# Patient Record
Sex: Female | Born: 1935 | Race: White | Hispanic: No | Marital: Single | Smoking: Never smoker
Health system: Southern US, Community
[De-identification: ages and names within clinical notes are randomized; demographics above are authoritative.]

## PROBLEM LIST (undated history)

## (undated) DIAGNOSIS — K635 Polyp of colon: Secondary | ICD-10-CM

## (undated) DIAGNOSIS — F329 Major depressive disorder, single episode, unspecified: Secondary | ICD-10-CM

## (undated) DIAGNOSIS — F419 Anxiety disorder, unspecified: Secondary | ICD-10-CM

## (undated) DIAGNOSIS — A809 Acute poliomyelitis, unspecified: Secondary | ICD-10-CM

## (undated) DIAGNOSIS — D649 Anemia, unspecified: Secondary | ICD-10-CM

## (undated) DIAGNOSIS — F32A Depression, unspecified: Secondary | ICD-10-CM

## (undated) DIAGNOSIS — I10 Essential (primary) hypertension: Secondary | ICD-10-CM

## (undated) DIAGNOSIS — R569 Unspecified convulsions: Secondary | ICD-10-CM

## (undated) HISTORY — DX: Anxiety disorder, unspecified: F41.9

## (undated) HISTORY — DX: Major depressive disorder, single episode, unspecified: F32.9

## (undated) HISTORY — DX: Depression, unspecified: F32.A

## (undated) HISTORY — DX: Acute poliomyelitis, unspecified: A80.9

## (undated) HISTORY — DX: Unspecified convulsions: R56.9

## (undated) HISTORY — DX: Essential (primary) hypertension: I10

## (undated) HISTORY — DX: Polyp of colon: K63.5

---

## 2005-11-07 ENCOUNTER — Emergency Department (HOSPITAL_COMMUNITY): Admission: EM | Admit: 2005-11-07 | Discharge: 2005-11-07 | Payer: Self-pay | Admitting: Emergency Medicine

## 2006-07-11 ENCOUNTER — Ambulatory Visit (HOSPITAL_COMMUNITY): Admission: RE | Admit: 2006-07-11 | Discharge: 2006-07-11 | Payer: Self-pay | Admitting: Ophthalmology

## 2006-08-01 ENCOUNTER — Ambulatory Visit (HOSPITAL_COMMUNITY): Admission: RE | Admit: 2006-08-01 | Discharge: 2006-08-01 | Payer: Self-pay | Admitting: Ophthalmology

## 2007-01-18 HISTORY — PX: OTHER SURGICAL HISTORY: SHX169

## 2007-07-08 ENCOUNTER — Encounter: Payer: Self-pay | Admitting: Orthopedic Surgery

## 2007-07-08 ENCOUNTER — Emergency Department (HOSPITAL_COMMUNITY): Admission: EM | Admit: 2007-07-08 | Discharge: 2007-07-08 | Payer: Self-pay | Admitting: Emergency Medicine

## 2007-07-09 ENCOUNTER — Ambulatory Visit: Payer: Self-pay | Admitting: Orthopedic Surgery

## 2007-07-09 DIAGNOSIS — S82843A Displaced bimalleolar fracture of unspecified lower leg, initial encounter for closed fracture: Secondary | ICD-10-CM

## 2007-07-16 ENCOUNTER — Ambulatory Visit: Payer: Self-pay | Admitting: Orthopedic Surgery

## 2007-07-18 ENCOUNTER — Telehealth: Payer: Self-pay | Admitting: Orthopedic Surgery

## 2007-07-19 ENCOUNTER — Ambulatory Visit: Payer: Self-pay | Admitting: Orthopedic Surgery

## 2007-07-19 ENCOUNTER — Inpatient Hospital Stay (HOSPITAL_COMMUNITY): Admission: AD | Admit: 2007-07-19 | Discharge: 2007-07-26 | Payer: Self-pay | Admitting: Orthopedic Surgery

## 2007-07-19 ENCOUNTER — Telehealth: Payer: Self-pay | Admitting: Orthopedic Surgery

## 2007-07-20 ENCOUNTER — Encounter: Payer: Self-pay | Admitting: Orthopedic Surgery

## 2007-07-24 ENCOUNTER — Telehealth: Payer: Self-pay | Admitting: Orthopedic Surgery

## 2007-07-26 ENCOUNTER — Encounter: Payer: Self-pay | Admitting: Orthopedic Surgery

## 2007-07-30 ENCOUNTER — Encounter: Payer: Self-pay | Admitting: Orthopedic Surgery

## 2007-08-02 ENCOUNTER — Ambulatory Visit: Payer: Self-pay | Admitting: Orthopedic Surgery

## 2007-08-29 ENCOUNTER — Telehealth: Payer: Self-pay | Admitting: Orthopedic Surgery

## 2007-08-30 ENCOUNTER — Ambulatory Visit: Payer: Self-pay | Admitting: Orthopedic Surgery

## 2007-10-11 ENCOUNTER — Ambulatory Visit: Payer: Self-pay | Admitting: Orthopedic Surgery

## 2008-04-21 ENCOUNTER — Emergency Department (HOSPITAL_COMMUNITY): Admission: EM | Admit: 2008-04-21 | Discharge: 2008-04-21 | Payer: Self-pay | Admitting: Emergency Medicine

## 2008-05-01 ENCOUNTER — Ambulatory Visit (HOSPITAL_COMMUNITY): Admission: RE | Admit: 2008-05-01 | Discharge: 2008-05-01 | Payer: Self-pay | Admitting: Pulmonary Disease

## 2008-08-04 ENCOUNTER — Emergency Department (HOSPITAL_COMMUNITY): Admission: EM | Admit: 2008-08-04 | Discharge: 2008-08-04 | Payer: Self-pay | Admitting: Emergency Medicine

## 2009-01-14 ENCOUNTER — Emergency Department (HOSPITAL_COMMUNITY): Admission: EM | Admit: 2009-01-14 | Discharge: 2009-01-14 | Payer: Self-pay | Admitting: Emergency Medicine

## 2009-04-18 ENCOUNTER — Ambulatory Visit (HOSPITAL_COMMUNITY): Admission: EM | Admit: 2009-04-18 | Discharge: 2009-04-18 | Payer: Self-pay | Admitting: Emergency Medicine

## 2009-04-18 ENCOUNTER — Ambulatory Visit: Payer: Self-pay | Admitting: Internal Medicine

## 2009-04-20 ENCOUNTER — Encounter: Payer: Self-pay | Admitting: Internal Medicine

## 2009-05-12 ENCOUNTER — Ambulatory Visit (HOSPITAL_COMMUNITY): Admission: RE | Admit: 2009-05-12 | Discharge: 2009-05-12 | Payer: Self-pay | Admitting: Internal Medicine

## 2009-05-12 ENCOUNTER — Ambulatory Visit: Payer: Self-pay | Admitting: Internal Medicine

## 2009-11-17 HISTORY — PX: FOOT SURGERY: SHX648

## 2009-12-16 ENCOUNTER — Inpatient Hospital Stay (HOSPITAL_COMMUNITY): Admission: EM | Admit: 2009-12-16 | Discharge: 2009-12-18 | Payer: Self-pay | Admitting: Emergency Medicine

## 2010-02-16 NOTE — Letter (Signed)
Summary: triage order  triage order   Imported By: Sofie Rower 04/20/2009 10:50:53  _____________________________________________________________________  External Attachment:    Type:   Image     Comment:   External Document

## 2010-03-30 LAB — COMPREHENSIVE METABOLIC PANEL
ALT: 14 U/L (ref 0–35)
AST: 33 U/L (ref 0–37)
Albumin: 3.6 g/dL (ref 3.5–5.2)
Alkaline Phosphatase: 99 U/L (ref 39–117)
CO2: 28 mEq/L (ref 19–32)
Chloride: 102 mEq/L (ref 96–112)
Creatinine, Ser: 0.91 mg/dL (ref 0.4–1.2)
GFR calc Af Amer: >60 mL/min (ref >60)
GFR calc non Af Amer: >60 mL/min (ref >60)
Potassium: 4.4 mEq/L (ref 3.5–5.1)
Total Bilirubin: 0.5 mg/dL (ref 0.3–1.2)

## 2010-03-30 LAB — BASIC METABOLIC PANEL
CO2: 25 mEq/L (ref 19–32)
Calcium: 8.1 mg/dL — ABNORMAL LOW (ref 8.4–10.5)
GFR calc Af Amer: >60 mL/min (ref >60)
Glucose, Bld: 119 mg/dL — ABNORMAL HIGH (ref 70–99)
Potassium: 3.4 mEq/L — ABNORMAL LOW (ref 3.5–5.1)
Sodium: 138 mEq/L (ref 135–145)

## 2010-03-30 LAB — CBC
HCT: 31.2 % — ABNORMAL LOW (ref 36.0–46.0)
Hemoglobin: 10.5 g/dL — ABNORMAL LOW (ref 12.0–15.0)
Hemoglobin: 11.5 g/dL — ABNORMAL LOW (ref 12.0–15.0)
MCH: 30.6 pg (ref 26.0–34.0)
MCH: 31.1 pg (ref 26.0–34.0)
MCHC: 33.7 g/dL (ref 30.0–36.0)
Platelets: 183 10*3/uL (ref 150–400)
RBC: 3.38 MIL/uL — ABNORMAL LOW (ref 3.87–5.11)
RBC: 3.76 MIL/uL — ABNORMAL LOW (ref 3.87–5.11)
WBC: 6.4 10*3/uL (ref 4.0–10.5)

## 2010-03-30 LAB — DIFFERENTIAL
Basophils Absolute: 0 10*3/uL (ref 0.0–0.1)
Basophils Relative: 0 % (ref 0–1)
Eosinophils Absolute: 0 10*3/uL (ref 0.0–0.7)
Eosinophils Relative: 1 % (ref 0–5)
Lymphocytes Relative: 12 % (ref 12–46)
Monocytes Absolute: 0.7 10*3/uL (ref 0.1–1.0)

## 2010-03-30 LAB — PROTIME-INR
INR: 1.14 (ref 0.00–1.49)
Prothrombin Time: 14.8 seconds (ref 11.6–15.2)

## 2010-03-30 LAB — MAGNESIUM: Magnesium: 1.7 mg/dL (ref 1.5–2.5)

## 2010-03-31 LAB — CBC
MCHC: 33.1 g/dL (ref 30.0–36.0)
Platelets: 184 10*3/uL (ref 150–400)
RDW: 14.2 % (ref 11.5–15.5)
WBC: 6.2 10*3/uL (ref 4.0–10.5)

## 2010-03-31 LAB — URINALYSIS, ROUTINE W REFLEX MICROSCOPIC
Glucose, UA: NEGATIVE mg/dL
Ketones, ur: NEGATIVE mg/dL
Nitrite: NEGATIVE
Protein, ur: NEGATIVE mg/dL
Urobilinogen, UA: 0.2 mg/dL (ref 0.0–1.0)

## 2010-03-31 LAB — POCT I-STAT, CHEM 8
Calcium, Ion: 1.03 mmol/L — ABNORMAL LOW (ref 1.12–1.32)
Chloride: 104 mEq/L (ref 96–112)
Glucose, Bld: 95 mg/dL (ref 70–99)
HCT: 43 % (ref 36.0–46.0)
Hemoglobin: 14.6 g/dL (ref 12.0–15.0)
Potassium: 4.1 mEq/L (ref 3.5–5.1)

## 2010-03-31 LAB — PHENYTOIN LEVEL, TOTAL: Phenytoin Lvl: 12.3 ug/mL (ref 10.0–20.0)

## 2010-03-31 LAB — DIFFERENTIAL
Basophils Absolute: 0 10*3/uL (ref 0.0–0.1)
Basophils Relative: 1 % (ref 0–1)
Lymphocytes Relative: 25 % (ref 12–46)
Neutro Abs: 3.9 10*3/uL (ref 1.7–7.7)
Neutrophils Relative %: 63 % (ref 43–77)

## 2010-04-07 LAB — BASIC METABOLIC PANEL
BUN: 18 mg/dL (ref 6–23)
Chloride: 102 mEq/L (ref 96–112)
Creatinine, Ser: 0.95 mg/dL (ref 0.4–1.2)
GFR calc Af Amer: >60 mL/min (ref >60)
GFR calc non Af Amer: 58 mL/min — ABNORMAL LOW (ref >60)
Potassium: 3.7 mEq/L (ref 3.5–5.1)

## 2010-04-07 LAB — CBC
MCV: 91.3 fL (ref 78.0–100.0)
Platelets: 218 10*3/uL (ref 150–400)
RBC: 4.49 MIL/uL (ref 3.87–5.11)
WBC: 6.7 10*3/uL (ref 4.0–10.5)

## 2010-04-07 LAB — DIFFERENTIAL
Lymphocytes Relative: 14 % (ref 12–46)
Lymphs Abs: 1 10*3/uL (ref 0.7–4.0)
Monocytes Relative: 6 % (ref 3–12)
Neutrophils Relative %: 79 % — ABNORMAL HIGH (ref 43–77)

## 2010-04-07 LAB — PHENYTOIN LEVEL, TOTAL: Phenytoin Lvl: 23.1 ug/mL — ABNORMAL HIGH (ref 10.0–20.0)

## 2010-04-19 LAB — BASIC METABOLIC PANEL
CO2: 30 mEq/L (ref 19–32)
Chloride: 105 mEq/L (ref 96–112)
GFR calc Af Amer: >60 mL/min (ref >60)
Potassium: 3.9 mEq/L (ref 3.5–5.1)
Sodium: 138 mEq/L (ref 135–145)

## 2010-04-19 LAB — PHENYTOIN LEVEL, TOTAL: Phenytoin Lvl: 6 ug/mL — ABNORMAL LOW (ref 10.0–20.0)

## 2010-06-01 NOTE — Discharge Summary (Signed)
[name], [name]            ACCOUNT NO.:  [id number]   MEDICAL RECORD NO.:  [id number]          PATIENT TYPE:  INP   LOCATION:  [id number]                          FACILITY:  [facility]   PHYSICIAN:  Carole Civil, M.D.DATE OF BIRTH:  07/10/1933   DATE OF ADMISSION:  07/19/2007  DATE OF DISCHARGE:  07/09/2009LH                               DISCHARGE SUMMARY   ADDENDUM   The patient was not discharged yesterday secondary to administrative  issues.  We will try to discharge again today.  There has been no change  in condition.  She remains stable, status post right ankle OTIF.      Carole Civil, M.D.  Electronically Signed     SEH/MEDQ  D:  07/26/2007  T:  07/26/2007  Job:  [id number]

## 2010-06-01 NOTE — Op Note (Signed)
[name], Reynolds            ACCOUNT NO.:  [id number]   MEDICAL RECORD NO.:  [id number]          PATIENT TYPE:  INP   LOCATION:  [id number]                          FACILITY:  [facility]   PHYSICIAN:  Carole Civil, M.D.DATE OF BIRTH:  [birth year]   DATE OF PROCEDURE:  07/20/2007  DATE OF DISCHARGE:                               OPERATIVE REPORT   HISTORY:  A 77 year old female with polio, which effects her left side.  She normally walks with a walker, but not very well.  She has a history  of anxiety, depression, seizures, hypertension, but no history of  previous surgery, presented after falling on June 21, she had a  bimalleolar ankle fracture, initially was treated with a cast.  Post  casting x-rays 1 week later showed displacement of the fracture,  surgery was recommended.  Informed consent was obtained.  The patient  consented for surgery and was admitted to the hospital because she could  not walk.   PREOPERATIVE DIAGNOSIS:  Bimalleolar closed right ankle fracture.   POSTOPERATIVE DIAGNOSIS:  Bimalleolar closed right ankle fracture.   PROCEDURE:  Open treatment internal fixation, right ankle bimalleolar  fracture.   IMPLANTS:  Synthes one-third tubular plate and two 2.0 K-wires, which  were placed on the medial side.   SURGEON:  Carole Civil, MD.   ASSISTANT:  There were no assistants.   ANESTHETIC:  Spinal.   OPERATIVE FINDINGS:  Closed bimalleolar fracture with splintering of the  fibula laterally.  Skin was acceptable for surgery.   DETAILS:  The patient was identified as Brittney Reynolds, her right leg  was marked for surgery, countersigned by the surgeon.  History and  physical were already done.  The patient was taken to surgery for spinal  anesthetic followed by administering of 1 g of Ancef.  The patient  placed supine on the operating table, two 5-pound sandbags under the  right hip and thigh.  Right leg was prepped with DuraPrep, draped  sterilely,  and a time-out procedure was initiated and completed.  Everyone agreed on the procedure, open treatment internal fixation right  ankle/right ankle repair layman's terms for the patient.   After exsanguinating the limb with a 4-inch Esmarch, tourniquet was  elevated to 300 mmHg.  The lateral incision was made full thickness flap  down to bone.  Subperiosteal dissection exposed the fracture, the  fracture was irrigated.  A curette was used to remove hematoma and the  fracture was manually reduced and held with a clamp.   The 10-hole one-third tubular plate was then applied to the fibula using  AO technique and radiographs were used to confirm the reduction in the  mortise AP views.   A wet sponge was placed in the lateral wound and the leg was externally  rotated and a longitudinal incision was made over the medial malleolus.  Subcutaneous tissue was divided to create a full thickness flap.  Care  was taken to preserve the neurovascular and tendinous structures on the  medial side.   Two K-wires were used to fix the medial malleolus using the talus as a  template  for reduction.   Radiographs confirmed reduction of the mortise, lateral view confirmed  overall position of plate and hardware.   The wounds were then irrigated with saline, closed with 2-0 Monocryl and  staples laterally in 2 layers before staple closure medially and then  laterally, we closed with 2-0 Monocryl in 1 layer with staples.  We then  injected some Marcaine with epinephrine 30 mL, applied Xeroform, 4x4s,  ABDs, and a short-leg cast in neutral position.  Tourniquet was released  prior to cast application.   The patient was taken to recovery room in stable condition.   POSTOP PLAN:  I did put the patient on subcu heparin for DVT  prophylaxis.  The patient will be mobilized bed to chair daily.  She is  nonweightbearing on the right side due to osteoporotic bone and I  suspect the cast will be needed for full 6  weeks before weightbearing  can be initiated of course pending radiographic evaluation.  Social  Service consult will be obtained to arrange for skilled nursing  facility.      Carole Civil, M.D.  Electronically Signed     SEH/MEDQ  D:  07/20/2007  T:  07/20/2007  Job:  [id number]

## 2010-06-01 NOTE — Discharge Summary (Signed)
NAMETANEJA, QUATTROCHI            ACCOUNT NO.:  [id number]   MEDICAL RECORD NO.:  [id number]          PATIENT TYPE:  INP   LOCATION:  [id number]                          FACILITY:  [facility]   PHYSICIAN:  Carole Civil, M.D.DATE OF BIRTH:  01/20/1933   DATE OF ADMISSION:  07/19/2007  DATE OF DISCHARGE:  07/09/2009LH                               DISCHARGE SUMMARY   ADMITTING DIAGNOSIS:  Right ankle fracture.   DISCHARGE DIAGNOSIS:  Right ankle fracture.   PROCEDURES:  Open treatment, internal fixation right ankle bimalleolar  fracture, with medial and lateral fixation, application of short-leg  cast.   ADMITTING AND OPERATING PHYSICIAN:  Carole Civil, M.D.   HISTORY:  A 77 year old female, with left-side weakness secondary to  polio.  Ambulates household with a walker.  History of anxiety,  depression, seizure disorder, hypertension, who presented after falling  on July 08, 2007.  She was treated with a short-leg cast.  Had a week.  Follow-up x-ray showed displacing fracture.  Surgery was recommended.  She came in for that.   Today, she is afebrile.  She has an area of bleeding from the lateral  wound, which is stable.  Cast is intact.  She has minimal swelling in  her feet.  Sensation is intact.  Color is good.   DISCHARGE MEDICATIONS:  1. Lexapro 10 mg a day.  2. Toprol XL 25 mg a day.  3. Felodipine 10 mg a day.  4. Dilantin 100 mg a day.  5. Evista 60 mg a day.   WEIGHTBEARING STATUS:  Nonweightbearing.   MOBILITY:  Bed to chair only.   FOLLOW-UP VISIT:  Should be Thursday August 02, 2007.  Call [phone] to  get appointment time.   DISPOSITION:  Skilled nursing.   CONDITION:  Overall improved.      Carole Civil, M.D.  Electronically Signed     SEH/MEDQ  D:  07/23/2007  T:  07/23/2007  Job:  [id number]

## 2010-06-01 NOTE — Group Therapy Note (Signed)
Brittney Reynolds, Brittney Reynolds            ACCOUNT NO.:  [id number]   MEDICAL RECORD NO.:  [id number]          PATIENT TYPE:  INP   LOCATION:  [id number]                          FACILITY:  [facility]   PHYSICIAN:  Carole Civil, M.D.DATE OF BIRTH:  05/21/1933   DATE OF PROCEDURE:  DATE OF DISCHARGE:                                 PROGRESS NOTE   ADDENDUM   The patient not discharged on July 23, 2007, still in the hospital  waiting placement.   DIAGNOSIS:  Right ankle fracture.   PROCEDURE:  Open treatment and internal fixation right ankle.  The  patient is in stable condition.      Carole Civil, M.D.  Electronically Signed     SEH/MEDQ  D:  07/24/2007  T:  07/25/2007  Job:  [id number]

## 2010-06-01 NOTE — H&P (Signed)
NAMEALYSE, SCHNAUTZ            ACCOUNT NO.:  [id number]   MEDICAL RECORD NO.:  [id number]          PATIENT TYPE:  AMB   LOCATION:  DAY                           FACILITY:  [facility]   PHYSICIAN:  Carole Civil, M.D.DATE OF BIRTH:  05-Sep-1933   DATE OF ADMISSION:  DATE OF DISCHARGE:  LH                              HISTORY & PHYSICAL   CHIEF COMPLAINT:  Right ankle fracture.   HISTORY:  This is a 77 year old with a history of polio which affects  her left side who normally walks with a walker, but poorly, who has  history of anxiety, depression, seizures and hypertension but no history  of surgery, no serious family history, and presents status post fall on  June 21.  X-rays done at [facility] June 21.  She had a  bimalleolar fracture. Initial treatment was in the emergency room with a  splint and she was sent for followup at our office on the 22nd.  We  placed her in a short leg non-weightbearing cast and had a followup x-  ray in a week.  On the followup x-rays the fracture had displaced and  the patient was advised that she would need surgical treatment so she is  admitted for surgery on the right ankle.  The patient is full-assist for  transfers and for walking.   NO KNOWN DRUG ALLERGIES.   SOCIAL HISTORY:  She lives with her sister who is also in poor health.  No tobacco use, caffeine is occasional, no alcohol use.   She has stable vital signs.  Normal development, nutrition, grooming, hygiene, no gross deformities,  medium body frame.  CARDIOVASCULAR:  Observation and palpation were normal.  LYMPH NODES:  Negative on palpation.  SKIN:  Showed no abnormalities.  Coordination was noted for poor coordination, reflexes were deferred,  sensation was abnormal but pressure and temperature on the right foot  were normal.  PSYCHIATRIC FINDINGS:  She was awake, alert and oriented x3.  Mood and  affect were normal.  MUSCULOSKELETAL FINDINGS:  Gait was by wheelchair  only, she was maximum  assist to stand and go from chair to exam table.  Right ankle on initial  presentation was swollen with medial and lateral tenderness.  No  deformity was noted on initial presentation.   Patient is now in a cast with good capillary refill to the toes on last  visit in the office.   IMPRESSION:  Bimalleolar fracture.   PLAN:  Open treatment/internal fixation of right ankle.   MEDICATIONS:  1. Lexapro 10 mg.  2. Toprol XL 25 mg.  3. Felodipine 10 mg.  4. Dilantin 100 mg.  5. Evista 60 mg.   Informed consent was given in the office after explanation of the  following rationale for surgery:   Initial presentation, patient's fracture was non-displaced and we opted  for non-operative treatment.  However, the fracture has displaced in the  cast and surgical treatment is necessary to stabilize the ankle and  improve the patient's chance at weightbearing.  The patient will most  likely need skilled nursing facility after surgery secondary to  polio  and weakness on the left side.      Carole Civil, M.D.     SEH/MEDQ  D:  07/19/2007  T:  07/19/2007  Job:  [id number]   cc:   [facility] 3A

## 2010-06-01 NOTE — H&P (Signed)
NAMECHANON, GROSSER            ACCOUNT NO.:  [id number]   MEDICAL RECORD NO.:  [id number]          PATIENT TYPE:  AMB   LOCATION:  DAY                           FACILITY:  [facility]   PHYSICIAN:  Carole Civil, M.D.DATE OF BIRTH:  1933-08-25   DATE OF ADMISSION:  DATE OF DISCHARGE:  LH                              HISTORY & PHYSICAL   CHIEF COMPLAINT:  Right ankle fracture.   HISTORY:  This is a 77 year old with a history of polio which affects  her left side who normally walks with a walker, but poorly, who has  history of anxiety, depression, seizures and hypertension but no history  of surgery, no serious family history, and presents status post fall on  June 21.  X-rays done at [facility] June 21.  She had a  bimalleolar fracture. Initial treatment was in the emergency room with a  splint and she was sent for followup at our office on the 22nd.  We  placed her in a short leg non-weightbearing cast and had a followup x-  ray in a week.  On the followup x-rays the fracture had displaced and  the patient was advised that she would need surgical treatment so she is  admitted for surgery on the right ankle.  The patient is full-assist for  transfers and for walking.   NO KNOWN DRUG ALLERGIES.   SOCIAL HISTORY:  She lives with her sister who is also in poor health.  No tobacco use, caffeine is occasional, no alcohol use.   She has stable vital signs.  Normal development, nutrition, grooming, hygiene, no gross deformities,  medium body frame.  CARDIOVASCULAR:  Observation and palpation were normal.  LYMPH NODES:  Negative on palpation.  SKIN:  Showed no abnormalities.  Coordination was noted for poor coordination, reflexes were deferred,  sensation was abnormal but pressure and temperature on the right foot  were normal.  PSYCHIATRIC FINDINGS:  She was awake, alert and oriented x3.  Mood and  affect were normal.  MUSCULOSKELETAL FINDINGS:  Gait was by wheelchair  only, she was maximum  assist to stand and go from chair to exam table.  Right ankle on initial  presentation was swollen with medial and lateral tenderness.  No  deformity was noted on initial presentation.   Patient is now in a cast with good capillary refill to the toes on last  visit in the office.   IMPRESSION:  Bimalleolar fracture.   PLAN:  Open treatment/internal fixation of right ankle.   MEDICATIONS:  1. Lexapro 10 mg.  2. Toprol XL 25 mg.  3. Felodipine 10 mg.  4. Dilantin 100 mg.  5. Evista 60 mg.   Informed consent was given in the office after explanation of the  following rationale for surgery:   Initial presentation, patient's fracture was non-displaced and we opted  for non-operative treatment.  However, the fracture has displaced in the  cast and surgical treatment is necessary to stabilize the ankle and  improve the patient's chance at weightbearing.  The patient will most  likely need skilled nursing facility after surgery secondary to  polio  and weakness on the left side.      Carole Civil, M.D.  Electronically Signed     SEH/MEDQ  D:  07/19/2007  T:  07/19/2007  Job:  [id number]   cc:   [facility] 3A

## 2010-06-01 NOTE — Group Therapy Note (Signed)
Brittney Reynolds, Brittney Reynolds            ACCOUNT NO.:  [id number]   MEDICAL RECORD NO.:  [id number]          PATIENT TYPE:  INP   LOCATION:  [id number]                          FACILITY:  [facility]   PHYSICIAN:  Carole Civil, M.D.DATE OF BIRTH:  [birth year]   DATE OF PROCEDURE:  DATE OF DISCHARGE:                                 PROGRESS NOTE   ADDENDUM   The patient remains afebrile.  There is an area of blood on the lateral  portion of the cast, which has stabilized and has not increased in the  last 48 hours.  She is stable and ready for discharge.      Carole Civil, M.D.  Electronically Signed     SEH/MEDQ  D:  07/25/2007  T:  07/25/2007  Job:  [id number]

## 2010-06-11 ENCOUNTER — Encounter: Payer: Self-pay | Admitting: Internal Medicine

## 2010-06-11 ENCOUNTER — Ambulatory Visit (INDEPENDENT_AMBULATORY_CARE_PROVIDER_SITE_OTHER): Payer: Medicare Other | Admitting: Internal Medicine

## 2010-06-11 ENCOUNTER — Ambulatory Visit: Payer: Self-pay | Admitting: Internal Medicine

## 2010-06-11 VITALS — BP 152/90 | HR 89 | Temp 97.4°F | Ht 65.0 in | Wt 166.0 lb

## 2010-06-11 DIAGNOSIS — K219 Gastro-esophageal reflux disease without esophagitis: Secondary | ICD-10-CM

## 2010-06-11 NOTE — Progress Notes (Signed)
Patient is accompanied by her caregiver today. She ambulates with a walker. She's not really having any esophageal dysphagia. However, when she runs out of her acid suppression therapy she does have a little more difficulty with reflux symptoms and some transient difficulties with food passage. She is on Nexium 40 mg suspension but actually wants a tablet because she has a difficult time opening the package secondary to her deficits related to polio.  She tells me that she has a history of colonic polyps and at one point in time, surgery was contemplated. She's had all of her colonoscopies in [place], the last being about 5 years ago. I do not have any records at this time. Certainly, she's not had any nausea vomiting melena or hematochezia. Her appetite is described as being good.  Review of Systems: Gen: Denies any fever, chills, sweats, anorexia, fatigue, weakness, malaise, weight loss, and sleep disorder CV: Denies chest pain, angina, palpitations, syncope, orthopnea, PND, peripheral edema, and claudication. Resp: Denies dyspnea at rest, dyspnea with exercise, cough, sputum, wheezing, coughing up blood, and pleurisy. GI: Denies vomiting blood, jaundice, and fecal incontinence.   Denies dysphagia or odynophagia. Derm: Denies rash, itching, dry skin, hives, moles, warts, or unhealing ulcers.  Psych: Denies depression, anxiety, memory loss, suicidal ideation, hallucinations, paranoia, and confusion. Heme: Denies bruising, bleeding, and enlarged lymph nodes.   Physical Exam: BP 152/90  Pulse 89  Temp(Src) 97.4 F (36.3 C) (Temporal)  Ht 5\' 5"  (1.651 m)  Wt 166 lb (75.297 kg)  BMI 27.62 kg/m2 General:   Alert,  Well-developed, well-nourished, frail, pleasant and cooperative in NAD Head:  Normocephalic and atraumatic. Eyes:  Sclera clear, no icterus.   Conjunctiva pink. Mouth:  No deformity or lesions, dentition normal. Neck:  Supple; no masses or thyromegaly. Heart:  Regular rate and  rhythm; no murmurs, clicks, rubs,  or gallops. Abdomen:  Soft, nontender and nondistended. No masses, hepatosplenomegaly or hernias noted. Normal bowel sounds, without guarding, and without rebound.   Msk:  Symmetrical without gross deformities. Normal posture. Pulses:  Normal pulses noted. Extremities:  Without clubbing or edema. Contractures upper ext. Neurologic:  Alert and  oriented x4;  grossly normal neurologically.  Unsteady gait; le weakness; mild speech impediment Skin:  Intact without significant lesions or rashes. Cervical Nodes:  No significant cervical adenopathy. Psych:  Alert and cooperative. Normal mood and affect.

## 2010-06-15 ENCOUNTER — Ambulatory Visit: Payer: Self-pay | Admitting: Gastroenterology

## 2010-06-23 ENCOUNTER — Emergency Department (HOSPITAL_COMMUNITY)
Admission: EM | Admit: 2010-06-23 | Discharge: 2010-06-23 | Disposition: A | Discharge status: Home / Self Care | Payer: Medicare Other | Attending: Emergency Medicine | Admitting: Emergency Medicine

## 2010-06-23 ENCOUNTER — Emergency Department (HOSPITAL_COMMUNITY): Payer: Medicare Other

## 2010-06-23 DIAGNOSIS — I10 Essential (primary) hypertension: Secondary | ICD-10-CM | POA: Insufficient documentation

## 2010-06-23 DIAGNOSIS — W19XXXA Unspecified fall, initial encounter: Secondary | ICD-10-CM | POA: Insufficient documentation

## 2010-06-23 DIAGNOSIS — K219 Gastro-esophageal reflux disease without esophagitis: Secondary | ICD-10-CM | POA: Insufficient documentation

## 2010-06-23 DIAGNOSIS — G319 Degenerative disease of nervous system, unspecified: Secondary | ICD-10-CM | POA: Insufficient documentation

## 2010-06-23 DIAGNOSIS — Z79899 Other long term (current) drug therapy: Secondary | ICD-10-CM | POA: Insufficient documentation

## 2010-06-23 DIAGNOSIS — Y92009 Unspecified place in unspecified non-institutional (private) residence as the place of occurrence of the external cause: Secondary | ICD-10-CM | POA: Insufficient documentation

## 2010-06-23 DIAGNOSIS — Z0389 Encounter for observation for other suspected diseases and conditions ruled out: Secondary | ICD-10-CM | POA: Insufficient documentation

## 2010-06-23 LAB — TROPONIN I: Troponin I: <0.3 ng/mL (ref <0.30)

## 2010-06-23 LAB — COMPREHENSIVE METABOLIC PANEL
BUN: 22 mg/dL (ref 6–23)
Calcium: 9.7 mg/dL (ref 8.4–10.5)
Glucose, Bld: 96 mg/dL (ref 70–99)
Sodium: 138 mEq/L (ref 135–145)
Total Protein: 8 g/dL (ref 6.0–8.3)

## 2010-06-23 LAB — URINALYSIS, ROUTINE W REFLEX MICROSCOPIC
Glucose, UA: NEGATIVE mg/dL
Ketones, ur: NEGATIVE mg/dL
Nitrite: NEGATIVE
Specific Gravity, Urine: 1.02 (ref 1.005–1.030)
pH: 6.5 (ref 5.0–8.0)

## 2010-06-23 LAB — CBC
HCT: 37.9 % (ref 36.0–46.0)
Platelets: 190 10*3/uL (ref 150–400)
RBC: 4.07 MIL/uL (ref 3.87–5.11)
RDW: 15.3 % (ref 11.5–15.5)
WBC: 5 10*3/uL (ref 4.0–10.5)

## 2010-06-23 LAB — DIFFERENTIAL
Basophils Absolute: 0 10*3/uL (ref 0.0–0.1)
Eosinophils Relative: 1 % (ref 0–5)
Lymphocytes Relative: 22 % (ref 12–46)
Neutro Abs: 3.4 10*3/uL (ref 1.7–7.7)
Neutrophils Relative %: 67 % (ref 43–77)

## 2010-06-23 LAB — CK TOTAL AND CKMB (NOT AT ARMC)
CK, MB: 3.6 ng/mL (ref 0.3–4.0)
Relative Index: 2.7 — ABNORMAL HIGH (ref 0.0–2.5)

## 2010-10-14 LAB — DIFFERENTIAL
Eosinophils Absolute: 0.1
Eosinophils Relative: 1
Lymphocytes Relative: 24
Lymphs Abs: 1.3
Monocytes Absolute: 0.4
Monocytes Relative: 7

## 2010-10-14 LAB — PROTIME-INR: Prothrombin Time: 13.8

## 2010-10-14 LAB — CBC
HCT: 35.7 — ABNORMAL LOW
Hemoglobin: 12.2
MCV: 90.8
Platelets: 264
RBC: 3.93
WBC: 5.4

## 2010-10-14 LAB — BASIC METABOLIC PANEL
CO2: 30
Chloride: 101
GFR calc Af Amer: >60
Sodium: 137

## 2010-11-02 LAB — BASIC METABOLIC PANEL
CO2: 25
Calcium: 9.3
GFR calc Af Amer: >60
Glucose, Bld: 84
Potassium: 4.3
Sodium: 137

## 2010-11-02 LAB — HEMOGLOBIN AND HEMATOCRIT, BLOOD: HCT: 37.6

## 2010-11-03 LAB — DIFFERENTIAL
Eosinophils Relative: 1
Lymphocytes Relative: 40
Lymphs Abs: 1.8
Monocytes Relative: 10
Neutrophils Relative %: 49

## 2010-11-03 LAB — CBC
HCT: 35.7 — ABNORMAL LOW
MCV: 88.5
Platelets: 241
RBC: 4.03
WBC: 4.4

## 2010-12-31 ENCOUNTER — Other Ambulatory Visit: Payer: Self-pay | Admitting: Internal Medicine

## 2011-01-03 NOTE — Telephone Encounter (Signed)
Please call patient. Rourk's last note states she is on Nexium. Does she need refill on Nexium or Protonix?

## 2011-01-05 NOTE — Telephone Encounter (Signed)
What is status? 

## 2011-01-06 NOTE — Telephone Encounter (Signed)
Spoke with pts aide- she was still using the nexium suspension and was having a hard time opening the packets. The aide is not allowed to help her with her meds. The pt doesn't  care which one she gets as long as its a pill and not a powder.

## 2011-01-27 DIAGNOSIS — I10 Essential (primary) hypertension: Secondary | ICD-10-CM | POA: Diagnosis not present

## 2011-01-27 DIAGNOSIS — R569 Unspecified convulsions: Secondary | ICD-10-CM | POA: Diagnosis not present

## 2011-02-02 ENCOUNTER — Ambulatory Visit (HOSPITAL_COMMUNITY)
Admission: RE | Admit: 2011-02-02 | Discharge: 2011-02-02 | Disposition: A | Discharge status: Home / Self Care | Payer: Medicare Other | Source: Ambulatory Visit | Attending: Pulmonary Disease | Admitting: Pulmonary Disease

## 2011-02-02 ENCOUNTER — Other Ambulatory Visit (HOSPITAL_COMMUNITY): Payer: Self-pay | Admitting: Pulmonary Disease

## 2011-02-02 ENCOUNTER — Ambulatory Visit (HOSPITAL_COMMUNITY): Admission: RE | Admit: 2011-02-02 | Payer: Medicare Other | Source: Ambulatory Visit

## 2011-02-02 DIAGNOSIS — M25549 Pain in joints of unspecified hand: Secondary | ICD-10-CM | POA: Diagnosis not present

## 2011-02-02 DIAGNOSIS — M25539 Pain in unspecified wrist: Secondary | ICD-10-CM | POA: Diagnosis not present

## 2011-02-02 DIAGNOSIS — R52 Pain, unspecified: Secondary | ICD-10-CM

## 2011-02-02 DIAGNOSIS — M19049 Primary osteoarthritis, unspecified hand: Secondary | ICD-10-CM | POA: Diagnosis not present

## 2011-02-02 DIAGNOSIS — M79609 Pain in unspecified limb: Secondary | ICD-10-CM | POA: Diagnosis not present

## 2011-06-23 DIAGNOSIS — M6281 Muscle weakness (generalized): Secondary | ICD-10-CM | POA: Diagnosis not present

## 2011-06-23 DIAGNOSIS — M1990 Unspecified osteoarthritis, unspecified site: Secondary | ICD-10-CM | POA: Diagnosis not present

## 2011-06-23 DIAGNOSIS — R269 Unspecified abnormalities of gait and mobility: Secondary | ICD-10-CM | POA: Diagnosis not present

## 2011-06-24 DIAGNOSIS — M1990 Unspecified osteoarthritis, unspecified site: Secondary | ICD-10-CM | POA: Diagnosis not present

## 2011-06-24 DIAGNOSIS — R269 Unspecified abnormalities of gait and mobility: Secondary | ICD-10-CM | POA: Diagnosis not present

## 2011-06-24 DIAGNOSIS — M6281 Muscle weakness (generalized): Secondary | ICD-10-CM | POA: Diagnosis not present

## 2011-06-27 DIAGNOSIS — M1990 Unspecified osteoarthritis, unspecified site: Secondary | ICD-10-CM | POA: Diagnosis not present

## 2011-06-27 DIAGNOSIS — M6281 Muscle weakness (generalized): Secondary | ICD-10-CM | POA: Diagnosis not present

## 2011-06-27 DIAGNOSIS — R269 Unspecified abnormalities of gait and mobility: Secondary | ICD-10-CM | POA: Diagnosis not present

## 2011-06-28 DIAGNOSIS — R269 Unspecified abnormalities of gait and mobility: Secondary | ICD-10-CM | POA: Diagnosis not present

## 2011-06-28 DIAGNOSIS — M6281 Muscle weakness (generalized): Secondary | ICD-10-CM | POA: Diagnosis not present

## 2011-06-28 DIAGNOSIS — M1990 Unspecified osteoarthritis, unspecified site: Secondary | ICD-10-CM | POA: Diagnosis not present

## 2011-06-29 DIAGNOSIS — M1990 Unspecified osteoarthritis, unspecified site: Secondary | ICD-10-CM | POA: Diagnosis not present

## 2011-06-29 DIAGNOSIS — M6281 Muscle weakness (generalized): Secondary | ICD-10-CM | POA: Diagnosis not present

## 2011-06-29 DIAGNOSIS — I10 Essential (primary) hypertension: Secondary | ICD-10-CM | POA: Diagnosis not present

## 2011-06-29 DIAGNOSIS — K219 Gastro-esophageal reflux disease without esophagitis: Secondary | ICD-10-CM | POA: Diagnosis not present

## 2011-06-29 DIAGNOSIS — R269 Unspecified abnormalities of gait and mobility: Secondary | ICD-10-CM | POA: Diagnosis not present

## 2011-06-29 DIAGNOSIS — R569 Unspecified convulsions: Secondary | ICD-10-CM | POA: Diagnosis not present

## 2011-06-30 DIAGNOSIS — M1990 Unspecified osteoarthritis, unspecified site: Secondary | ICD-10-CM | POA: Diagnosis not present

## 2011-06-30 DIAGNOSIS — R269 Unspecified abnormalities of gait and mobility: Secondary | ICD-10-CM | POA: Diagnosis not present

## 2011-06-30 DIAGNOSIS — M6281 Muscle weakness (generalized): Secondary | ICD-10-CM | POA: Diagnosis not present

## 2011-07-01 DIAGNOSIS — M6281 Muscle weakness (generalized): Secondary | ICD-10-CM | POA: Diagnosis not present

## 2011-07-01 DIAGNOSIS — M1990 Unspecified osteoarthritis, unspecified site: Secondary | ICD-10-CM | POA: Diagnosis not present

## 2011-07-01 DIAGNOSIS — R269 Unspecified abnormalities of gait and mobility: Secondary | ICD-10-CM | POA: Diagnosis not present

## 2011-07-02 DIAGNOSIS — R269 Unspecified abnormalities of gait and mobility: Secondary | ICD-10-CM | POA: Diagnosis not present

## 2011-07-02 DIAGNOSIS — M6281 Muscle weakness (generalized): Secondary | ICD-10-CM | POA: Diagnosis not present

## 2011-07-02 DIAGNOSIS — M1990 Unspecified osteoarthritis, unspecified site: Secondary | ICD-10-CM | POA: Diagnosis not present

## 2011-07-04 DIAGNOSIS — M1990 Unspecified osteoarthritis, unspecified site: Secondary | ICD-10-CM | POA: Diagnosis not present

## 2011-07-04 DIAGNOSIS — R269 Unspecified abnormalities of gait and mobility: Secondary | ICD-10-CM | POA: Diagnosis not present

## 2011-07-04 DIAGNOSIS — M6281 Muscle weakness (generalized): Secondary | ICD-10-CM | POA: Diagnosis not present

## 2011-07-05 DIAGNOSIS — R269 Unspecified abnormalities of gait and mobility: Secondary | ICD-10-CM | POA: Diagnosis not present

## 2011-07-05 DIAGNOSIS — M1990 Unspecified osteoarthritis, unspecified site: Secondary | ICD-10-CM | POA: Diagnosis not present

## 2011-07-05 DIAGNOSIS — M6281 Muscle weakness (generalized): Secondary | ICD-10-CM | POA: Diagnosis not present

## 2011-07-06 DIAGNOSIS — R269 Unspecified abnormalities of gait and mobility: Secondary | ICD-10-CM | POA: Diagnosis not present

## 2011-07-06 DIAGNOSIS — M6281 Muscle weakness (generalized): Secondary | ICD-10-CM | POA: Diagnosis not present

## 2011-07-06 DIAGNOSIS — M1990 Unspecified osteoarthritis, unspecified site: Secondary | ICD-10-CM | POA: Diagnosis not present

## 2011-07-07 DIAGNOSIS — M6281 Muscle weakness (generalized): Secondary | ICD-10-CM | POA: Diagnosis not present

## 2011-07-07 DIAGNOSIS — M1990 Unspecified osteoarthritis, unspecified site: Secondary | ICD-10-CM | POA: Diagnosis not present

## 2011-07-07 DIAGNOSIS — R269 Unspecified abnormalities of gait and mobility: Secondary | ICD-10-CM | POA: Diagnosis not present

## 2011-07-08 DIAGNOSIS — R269 Unspecified abnormalities of gait and mobility: Secondary | ICD-10-CM | POA: Diagnosis not present

## 2011-07-08 DIAGNOSIS — M6281 Muscle weakness (generalized): Secondary | ICD-10-CM | POA: Diagnosis not present

## 2011-07-08 DIAGNOSIS — M1990 Unspecified osteoarthritis, unspecified site: Secondary | ICD-10-CM | POA: Diagnosis not present

## 2011-07-11 DIAGNOSIS — M1990 Unspecified osteoarthritis, unspecified site: Secondary | ICD-10-CM | POA: Diagnosis not present

## 2011-07-11 DIAGNOSIS — M6281 Muscle weakness (generalized): Secondary | ICD-10-CM | POA: Diagnosis not present

## 2011-07-11 DIAGNOSIS — R269 Unspecified abnormalities of gait and mobility: Secondary | ICD-10-CM | POA: Diagnosis not present

## 2011-07-12 DIAGNOSIS — R269 Unspecified abnormalities of gait and mobility: Secondary | ICD-10-CM | POA: Diagnosis not present

## 2011-07-12 DIAGNOSIS — M1990 Unspecified osteoarthritis, unspecified site: Secondary | ICD-10-CM | POA: Diagnosis not present

## 2011-07-12 DIAGNOSIS — M6281 Muscle weakness (generalized): Secondary | ICD-10-CM | POA: Diagnosis not present

## 2011-07-13 DIAGNOSIS — R269 Unspecified abnormalities of gait and mobility: Secondary | ICD-10-CM | POA: Diagnosis not present

## 2011-07-13 DIAGNOSIS — M6281 Muscle weakness (generalized): Secondary | ICD-10-CM | POA: Diagnosis not present

## 2011-07-13 DIAGNOSIS — M1990 Unspecified osteoarthritis, unspecified site: Secondary | ICD-10-CM | POA: Diagnosis not present

## 2011-07-14 DIAGNOSIS — M6281 Muscle weakness (generalized): Secondary | ICD-10-CM | POA: Diagnosis not present

## 2011-07-14 DIAGNOSIS — R269 Unspecified abnormalities of gait and mobility: Secondary | ICD-10-CM | POA: Diagnosis not present

## 2011-07-14 DIAGNOSIS — M1990 Unspecified osteoarthritis, unspecified site: Secondary | ICD-10-CM | POA: Diagnosis not present

## 2011-07-15 DIAGNOSIS — R269 Unspecified abnormalities of gait and mobility: Secondary | ICD-10-CM | POA: Diagnosis not present

## 2011-07-15 DIAGNOSIS — M1990 Unspecified osteoarthritis, unspecified site: Secondary | ICD-10-CM | POA: Diagnosis not present

## 2011-07-15 DIAGNOSIS — M6281 Muscle weakness (generalized): Secondary | ICD-10-CM | POA: Diagnosis not present

## 2011-07-18 DIAGNOSIS — R269 Unspecified abnormalities of gait and mobility: Secondary | ICD-10-CM | POA: Diagnosis not present

## 2011-07-18 DIAGNOSIS — M1990 Unspecified osteoarthritis, unspecified site: Secondary | ICD-10-CM | POA: Diagnosis not present

## 2011-07-18 DIAGNOSIS — M6281 Muscle weakness (generalized): Secondary | ICD-10-CM | POA: Diagnosis not present

## 2011-07-20 DIAGNOSIS — J811 Chronic pulmonary edema: Secondary | ICD-10-CM | POA: Diagnosis not present

## 2011-07-20 DIAGNOSIS — R569 Unspecified convulsions: Secondary | ICD-10-CM | POA: Diagnosis not present

## 2011-07-20 DIAGNOSIS — K219 Gastro-esophageal reflux disease without esophagitis: Secondary | ICD-10-CM | POA: Diagnosis not present

## 2011-07-20 DIAGNOSIS — I10 Essential (primary) hypertension: Secondary | ICD-10-CM | POA: Diagnosis not present

## 2011-07-20 DIAGNOSIS — M25519 Pain in unspecified shoulder: Secondary | ICD-10-CM | POA: Diagnosis not present

## 2011-08-17 DIAGNOSIS — R569 Unspecified convulsions: Secondary | ICD-10-CM | POA: Diagnosis not present

## 2011-08-17 DIAGNOSIS — I10 Essential (primary) hypertension: Secondary | ICD-10-CM | POA: Diagnosis not present

## 2011-08-17 DIAGNOSIS — K219 Gastro-esophageal reflux disease without esophagitis: Secondary | ICD-10-CM | POA: Diagnosis not present

## 2011-08-18 DIAGNOSIS — M6281 Muscle weakness (generalized): Secondary | ICD-10-CM | POA: Diagnosis not present

## 2011-08-18 DIAGNOSIS — R269 Unspecified abnormalities of gait and mobility: Secondary | ICD-10-CM | POA: Diagnosis not present

## 2011-08-18 DIAGNOSIS — M1990 Unspecified osteoarthritis, unspecified site: Secondary | ICD-10-CM | POA: Diagnosis not present

## 2011-08-19 DIAGNOSIS — M1990 Unspecified osteoarthritis, unspecified site: Secondary | ICD-10-CM | POA: Diagnosis not present

## 2011-08-19 DIAGNOSIS — M6281 Muscle weakness (generalized): Secondary | ICD-10-CM | POA: Diagnosis not present

## 2011-08-19 DIAGNOSIS — R269 Unspecified abnormalities of gait and mobility: Secondary | ICD-10-CM | POA: Diagnosis not present

## 2011-08-22 DIAGNOSIS — M6281 Muscle weakness (generalized): Secondary | ICD-10-CM | POA: Diagnosis not present

## 2011-08-22 DIAGNOSIS — M1990 Unspecified osteoarthritis, unspecified site: Secondary | ICD-10-CM | POA: Diagnosis not present

## 2011-08-22 DIAGNOSIS — R269 Unspecified abnormalities of gait and mobility: Secondary | ICD-10-CM | POA: Diagnosis not present

## 2011-08-23 DIAGNOSIS — M6281 Muscle weakness (generalized): Secondary | ICD-10-CM | POA: Diagnosis not present

## 2011-08-23 DIAGNOSIS — R269 Unspecified abnormalities of gait and mobility: Secondary | ICD-10-CM | POA: Diagnosis not present

## 2011-08-23 DIAGNOSIS — M1990 Unspecified osteoarthritis, unspecified site: Secondary | ICD-10-CM | POA: Diagnosis not present

## 2011-08-24 DIAGNOSIS — M6281 Muscle weakness (generalized): Secondary | ICD-10-CM | POA: Diagnosis not present

## 2011-08-24 DIAGNOSIS — R269 Unspecified abnormalities of gait and mobility: Secondary | ICD-10-CM | POA: Diagnosis not present

## 2011-08-24 DIAGNOSIS — M1990 Unspecified osteoarthritis, unspecified site: Secondary | ICD-10-CM | POA: Diagnosis not present

## 2011-08-25 DIAGNOSIS — R269 Unspecified abnormalities of gait and mobility: Secondary | ICD-10-CM | POA: Diagnosis not present

## 2011-08-25 DIAGNOSIS — M1990 Unspecified osteoarthritis, unspecified site: Secondary | ICD-10-CM | POA: Diagnosis not present

## 2011-08-25 DIAGNOSIS — M6281 Muscle weakness (generalized): Secondary | ICD-10-CM | POA: Diagnosis not present

## 2011-08-26 DIAGNOSIS — R269 Unspecified abnormalities of gait and mobility: Secondary | ICD-10-CM | POA: Diagnosis not present

## 2011-08-26 DIAGNOSIS — M6281 Muscle weakness (generalized): Secondary | ICD-10-CM | POA: Diagnosis not present

## 2011-08-26 DIAGNOSIS — M1990 Unspecified osteoarthritis, unspecified site: Secondary | ICD-10-CM | POA: Diagnosis not present

## 2011-08-29 DIAGNOSIS — M1990 Unspecified osteoarthritis, unspecified site: Secondary | ICD-10-CM | POA: Diagnosis not present

## 2011-08-29 DIAGNOSIS — R269 Unspecified abnormalities of gait and mobility: Secondary | ICD-10-CM | POA: Diagnosis not present

## 2011-08-29 DIAGNOSIS — M6281 Muscle weakness (generalized): Secondary | ICD-10-CM | POA: Diagnosis not present

## 2011-08-31 DIAGNOSIS — M1990 Unspecified osteoarthritis, unspecified site: Secondary | ICD-10-CM | POA: Diagnosis not present

## 2011-08-31 DIAGNOSIS — M6281 Muscle weakness (generalized): Secondary | ICD-10-CM | POA: Diagnosis not present

## 2011-08-31 DIAGNOSIS — R269 Unspecified abnormalities of gait and mobility: Secondary | ICD-10-CM | POA: Diagnosis not present

## 2011-09-01 DIAGNOSIS — M1990 Unspecified osteoarthritis, unspecified site: Secondary | ICD-10-CM | POA: Diagnosis not present

## 2011-09-01 DIAGNOSIS — M6281 Muscle weakness (generalized): Secondary | ICD-10-CM | POA: Diagnosis not present

## 2011-09-01 DIAGNOSIS — R269 Unspecified abnormalities of gait and mobility: Secondary | ICD-10-CM | POA: Diagnosis not present

## 2011-09-02 DIAGNOSIS — M6281 Muscle weakness (generalized): Secondary | ICD-10-CM | POA: Diagnosis not present

## 2011-09-02 DIAGNOSIS — R269 Unspecified abnormalities of gait and mobility: Secondary | ICD-10-CM | POA: Diagnosis not present

## 2011-09-02 DIAGNOSIS — M1990 Unspecified osteoarthritis, unspecified site: Secondary | ICD-10-CM | POA: Diagnosis not present

## 2011-09-03 DIAGNOSIS — M6281 Muscle weakness (generalized): Secondary | ICD-10-CM | POA: Diagnosis not present

## 2011-09-03 DIAGNOSIS — R269 Unspecified abnormalities of gait and mobility: Secondary | ICD-10-CM | POA: Diagnosis not present

## 2011-09-03 DIAGNOSIS — M1990 Unspecified osteoarthritis, unspecified site: Secondary | ICD-10-CM | POA: Diagnosis not present

## 2011-09-05 DIAGNOSIS — M1990 Unspecified osteoarthritis, unspecified site: Secondary | ICD-10-CM | POA: Diagnosis not present

## 2011-09-05 DIAGNOSIS — R269 Unspecified abnormalities of gait and mobility: Secondary | ICD-10-CM | POA: Diagnosis not present

## 2011-09-05 DIAGNOSIS — M6281 Muscle weakness (generalized): Secondary | ICD-10-CM | POA: Diagnosis not present

## 2011-09-06 DIAGNOSIS — Z7901 Long term (current) use of anticoagulants: Secondary | ICD-10-CM | POA: Diagnosis not present

## 2011-09-06 DIAGNOSIS — M1990 Unspecified osteoarthritis, unspecified site: Secondary | ICD-10-CM | POA: Diagnosis not present

## 2011-09-06 DIAGNOSIS — M6281 Muscle weakness (generalized): Secondary | ICD-10-CM | POA: Diagnosis not present

## 2011-09-06 DIAGNOSIS — R269 Unspecified abnormalities of gait and mobility: Secondary | ICD-10-CM | POA: Diagnosis not present

## 2011-09-07 DIAGNOSIS — R269 Unspecified abnormalities of gait and mobility: Secondary | ICD-10-CM | POA: Diagnosis not present

## 2011-09-07 DIAGNOSIS — M1990 Unspecified osteoarthritis, unspecified site: Secondary | ICD-10-CM | POA: Diagnosis not present

## 2011-09-07 DIAGNOSIS — M6281 Muscle weakness (generalized): Secondary | ICD-10-CM | POA: Diagnosis not present

## 2011-09-08 DIAGNOSIS — R269 Unspecified abnormalities of gait and mobility: Secondary | ICD-10-CM | POA: Diagnosis not present

## 2011-09-08 DIAGNOSIS — M6281 Muscle weakness (generalized): Secondary | ICD-10-CM | POA: Diagnosis not present

## 2011-09-08 DIAGNOSIS — M1990 Unspecified osteoarthritis, unspecified site: Secondary | ICD-10-CM | POA: Diagnosis not present

## 2011-09-09 DIAGNOSIS — M6281 Muscle weakness (generalized): Secondary | ICD-10-CM | POA: Diagnosis not present

## 2011-09-09 DIAGNOSIS — R269 Unspecified abnormalities of gait and mobility: Secondary | ICD-10-CM | POA: Diagnosis not present

## 2011-09-09 DIAGNOSIS — M1990 Unspecified osteoarthritis, unspecified site: Secondary | ICD-10-CM | POA: Diagnosis not present

## 2011-09-12 DIAGNOSIS — R269 Unspecified abnormalities of gait and mobility: Secondary | ICD-10-CM | POA: Diagnosis not present

## 2011-09-12 DIAGNOSIS — M6281 Muscle weakness (generalized): Secondary | ICD-10-CM | POA: Diagnosis not present

## 2011-09-12 DIAGNOSIS — M1990 Unspecified osteoarthritis, unspecified site: Secondary | ICD-10-CM | POA: Diagnosis not present

## 2011-09-13 DIAGNOSIS — M1990 Unspecified osteoarthritis, unspecified site: Secondary | ICD-10-CM | POA: Diagnosis not present

## 2011-09-13 DIAGNOSIS — M6281 Muscle weakness (generalized): Secondary | ICD-10-CM | POA: Diagnosis not present

## 2011-09-13 DIAGNOSIS — R269 Unspecified abnormalities of gait and mobility: Secondary | ICD-10-CM | POA: Diagnosis not present

## 2011-09-14 DIAGNOSIS — M1990 Unspecified osteoarthritis, unspecified site: Secondary | ICD-10-CM | POA: Diagnosis not present

## 2011-09-14 DIAGNOSIS — R269 Unspecified abnormalities of gait and mobility: Secondary | ICD-10-CM | POA: Diagnosis not present

## 2011-09-14 DIAGNOSIS — M6281 Muscle weakness (generalized): Secondary | ICD-10-CM | POA: Diagnosis not present

## 2011-09-15 DIAGNOSIS — M6281 Muscle weakness (generalized): Secondary | ICD-10-CM | POA: Diagnosis not present

## 2011-09-15 DIAGNOSIS — R269 Unspecified abnormalities of gait and mobility: Secondary | ICD-10-CM | POA: Diagnosis not present

## 2011-09-15 DIAGNOSIS — M1990 Unspecified osteoarthritis, unspecified site: Secondary | ICD-10-CM | POA: Diagnosis not present

## 2011-09-16 DIAGNOSIS — M6281 Muscle weakness (generalized): Secondary | ICD-10-CM | POA: Diagnosis not present

## 2011-09-16 DIAGNOSIS — M1990 Unspecified osteoarthritis, unspecified site: Secondary | ICD-10-CM | POA: Diagnosis not present

## 2011-09-16 DIAGNOSIS — R269 Unspecified abnormalities of gait and mobility: Secondary | ICD-10-CM | POA: Diagnosis not present

## 2011-09-21 DIAGNOSIS — Z9181 History of falling: Secondary | ICD-10-CM | POA: Diagnosis not present

## 2011-09-21 DIAGNOSIS — I10 Essential (primary) hypertension: Secondary | ICD-10-CM | POA: Diagnosis not present

## 2011-09-21 DIAGNOSIS — G40909 Epilepsy, unspecified, not intractable, without status epilepticus: Secondary | ICD-10-CM | POA: Diagnosis not present

## 2011-09-21 DIAGNOSIS — Z8612 Personal history of poliomyelitis: Secondary | ICD-10-CM | POA: Diagnosis not present

## 2011-09-21 DIAGNOSIS — Z8781 Personal history of (healed) traumatic fracture: Secondary | ICD-10-CM | POA: Diagnosis not present

## 2011-09-21 DIAGNOSIS — M6281 Muscle weakness (generalized): Secondary | ICD-10-CM | POA: Diagnosis not present

## 2011-09-21 DIAGNOSIS — M1990 Unspecified osteoarthritis, unspecified site: Secondary | ICD-10-CM | POA: Diagnosis not present

## 2011-09-21 DIAGNOSIS — R262 Difficulty in walking, not elsewhere classified: Secondary | ICD-10-CM | POA: Diagnosis not present

## 2011-09-23 DIAGNOSIS — G40909 Epilepsy, unspecified, not intractable, without status epilepticus: Secondary | ICD-10-CM | POA: Diagnosis not present

## 2011-09-23 DIAGNOSIS — Z9181 History of falling: Secondary | ICD-10-CM | POA: Diagnosis not present

## 2011-09-23 DIAGNOSIS — M1990 Unspecified osteoarthritis, unspecified site: Secondary | ICD-10-CM | POA: Diagnosis not present

## 2011-09-23 DIAGNOSIS — M6281 Muscle weakness (generalized): Secondary | ICD-10-CM | POA: Diagnosis not present

## 2011-09-23 DIAGNOSIS — R262 Difficulty in walking, not elsewhere classified: Secondary | ICD-10-CM | POA: Diagnosis not present

## 2011-09-23 DIAGNOSIS — I10 Essential (primary) hypertension: Secondary | ICD-10-CM | POA: Diagnosis not present

## 2011-09-26 DIAGNOSIS — M6281 Muscle weakness (generalized): Secondary | ICD-10-CM | POA: Diagnosis not present

## 2011-09-26 DIAGNOSIS — M1990 Unspecified osteoarthritis, unspecified site: Secondary | ICD-10-CM | POA: Diagnosis not present

## 2011-09-26 DIAGNOSIS — R262 Difficulty in walking, not elsewhere classified: Secondary | ICD-10-CM | POA: Diagnosis not present

## 2011-09-26 DIAGNOSIS — G40909 Epilepsy, unspecified, not intractable, without status epilepticus: Secondary | ICD-10-CM | POA: Diagnosis not present

## 2011-09-26 DIAGNOSIS — Z9181 History of falling: Secondary | ICD-10-CM | POA: Diagnosis not present

## 2011-09-26 DIAGNOSIS — I10 Essential (primary) hypertension: Secondary | ICD-10-CM | POA: Diagnosis not present

## 2011-09-28 DIAGNOSIS — I10 Essential (primary) hypertension: Secondary | ICD-10-CM | POA: Diagnosis not present

## 2011-09-28 DIAGNOSIS — R262 Difficulty in walking, not elsewhere classified: Secondary | ICD-10-CM | POA: Diagnosis not present

## 2011-09-28 DIAGNOSIS — G40909 Epilepsy, unspecified, not intractable, without status epilepticus: Secondary | ICD-10-CM | POA: Diagnosis not present

## 2011-09-28 DIAGNOSIS — M1990 Unspecified osteoarthritis, unspecified site: Secondary | ICD-10-CM | POA: Diagnosis not present

## 2011-09-28 DIAGNOSIS — Z9181 History of falling: Secondary | ICD-10-CM | POA: Diagnosis not present

## 2011-09-28 DIAGNOSIS — M6281 Muscle weakness (generalized): Secondary | ICD-10-CM | POA: Diagnosis not present

## 2011-09-29 DIAGNOSIS — I10 Essential (primary) hypertension: Secondary | ICD-10-CM | POA: Diagnosis not present

## 2011-09-29 DIAGNOSIS — M1990 Unspecified osteoarthritis, unspecified site: Secondary | ICD-10-CM | POA: Diagnosis not present

## 2011-09-29 DIAGNOSIS — Z9181 History of falling: Secondary | ICD-10-CM | POA: Diagnosis not present

## 2011-09-29 DIAGNOSIS — G40909 Epilepsy, unspecified, not intractable, without status epilepticus: Secondary | ICD-10-CM | POA: Diagnosis not present

## 2011-09-29 DIAGNOSIS — R262 Difficulty in walking, not elsewhere classified: Secondary | ICD-10-CM | POA: Diagnosis not present

## 2011-09-29 DIAGNOSIS — M6281 Muscle weakness (generalized): Secondary | ICD-10-CM | POA: Diagnosis not present

## 2011-10-03 DIAGNOSIS — M6281 Muscle weakness (generalized): Secondary | ICD-10-CM | POA: Diagnosis not present

## 2011-10-03 DIAGNOSIS — I10 Essential (primary) hypertension: Secondary | ICD-10-CM | POA: Diagnosis not present

## 2011-10-03 DIAGNOSIS — G40909 Epilepsy, unspecified, not intractable, without status epilepticus: Secondary | ICD-10-CM | POA: Diagnosis not present

## 2011-10-03 DIAGNOSIS — R262 Difficulty in walking, not elsewhere classified: Secondary | ICD-10-CM | POA: Diagnosis not present

## 2011-10-03 DIAGNOSIS — Z9181 History of falling: Secondary | ICD-10-CM | POA: Diagnosis not present

## 2011-10-03 DIAGNOSIS — M1990 Unspecified osteoarthritis, unspecified site: Secondary | ICD-10-CM | POA: Diagnosis not present

## 2011-10-04 DIAGNOSIS — R262 Difficulty in walking, not elsewhere classified: Secondary | ICD-10-CM | POA: Diagnosis not present

## 2011-10-04 DIAGNOSIS — M1990 Unspecified osteoarthritis, unspecified site: Secondary | ICD-10-CM | POA: Diagnosis not present

## 2011-10-04 DIAGNOSIS — I10 Essential (primary) hypertension: Secondary | ICD-10-CM | POA: Diagnosis not present

## 2011-10-04 DIAGNOSIS — G40909 Epilepsy, unspecified, not intractable, without status epilepticus: Secondary | ICD-10-CM | POA: Diagnosis not present

## 2011-10-04 DIAGNOSIS — Z9181 History of falling: Secondary | ICD-10-CM | POA: Diagnosis not present

## 2011-10-04 DIAGNOSIS — M6281 Muscle weakness (generalized): Secondary | ICD-10-CM | POA: Diagnosis not present

## 2011-10-05 DIAGNOSIS — R262 Difficulty in walking, not elsewhere classified: Secondary | ICD-10-CM | POA: Diagnosis not present

## 2011-10-05 DIAGNOSIS — G40909 Epilepsy, unspecified, not intractable, without status epilepticus: Secondary | ICD-10-CM | POA: Diagnosis not present

## 2011-10-05 DIAGNOSIS — Z9181 History of falling: Secondary | ICD-10-CM | POA: Diagnosis not present

## 2011-10-05 DIAGNOSIS — I10 Essential (primary) hypertension: Secondary | ICD-10-CM | POA: Diagnosis not present

## 2011-10-05 DIAGNOSIS — M6281 Muscle weakness (generalized): Secondary | ICD-10-CM | POA: Diagnosis not present

## 2011-10-05 DIAGNOSIS — M1990 Unspecified osteoarthritis, unspecified site: Secondary | ICD-10-CM | POA: Diagnosis not present

## 2011-10-06 DIAGNOSIS — M1990 Unspecified osteoarthritis, unspecified site: Secondary | ICD-10-CM | POA: Diagnosis not present

## 2011-10-06 DIAGNOSIS — Z9181 History of falling: Secondary | ICD-10-CM | POA: Diagnosis not present

## 2011-10-06 DIAGNOSIS — I10 Essential (primary) hypertension: Secondary | ICD-10-CM | POA: Diagnosis not present

## 2011-10-06 DIAGNOSIS — G40909 Epilepsy, unspecified, not intractable, without status epilepticus: Secondary | ICD-10-CM | POA: Diagnosis not present

## 2011-10-06 DIAGNOSIS — M6281 Muscle weakness (generalized): Secondary | ICD-10-CM | POA: Diagnosis not present

## 2011-10-06 DIAGNOSIS — R262 Difficulty in walking, not elsewhere classified: Secondary | ICD-10-CM | POA: Diagnosis not present

## 2011-10-10 DIAGNOSIS — R569 Unspecified convulsions: Secondary | ICD-10-CM | POA: Diagnosis not present

## 2011-10-10 DIAGNOSIS — Z23 Encounter for immunization: Secondary | ICD-10-CM | POA: Diagnosis not present

## 2011-10-10 DIAGNOSIS — I10 Essential (primary) hypertension: Secondary | ICD-10-CM | POA: Diagnosis not present

## 2011-10-11 DIAGNOSIS — G40909 Epilepsy, unspecified, not intractable, without status epilepticus: Secondary | ICD-10-CM | POA: Diagnosis not present

## 2011-10-11 DIAGNOSIS — R262 Difficulty in walking, not elsewhere classified: Secondary | ICD-10-CM | POA: Diagnosis not present

## 2011-10-11 DIAGNOSIS — M6281 Muscle weakness (generalized): Secondary | ICD-10-CM | POA: Diagnosis not present

## 2011-10-11 DIAGNOSIS — I10 Essential (primary) hypertension: Secondary | ICD-10-CM | POA: Diagnosis not present

## 2011-10-11 DIAGNOSIS — Z9181 History of falling: Secondary | ICD-10-CM | POA: Diagnosis not present

## 2011-10-11 DIAGNOSIS — M1990 Unspecified osteoarthritis, unspecified site: Secondary | ICD-10-CM | POA: Diagnosis not present

## 2011-10-12 DIAGNOSIS — R262 Difficulty in walking, not elsewhere classified: Secondary | ICD-10-CM | POA: Diagnosis not present

## 2011-10-12 DIAGNOSIS — G40909 Epilepsy, unspecified, not intractable, without status epilepticus: Secondary | ICD-10-CM | POA: Diagnosis not present

## 2011-10-12 DIAGNOSIS — Z9181 History of falling: Secondary | ICD-10-CM | POA: Diagnosis not present

## 2011-10-12 DIAGNOSIS — M6281 Muscle weakness (generalized): Secondary | ICD-10-CM | POA: Diagnosis not present

## 2011-10-12 DIAGNOSIS — M1990 Unspecified osteoarthritis, unspecified site: Secondary | ICD-10-CM | POA: Diagnosis not present

## 2011-10-12 DIAGNOSIS — I10 Essential (primary) hypertension: Secondary | ICD-10-CM | POA: Diagnosis not present

## 2011-10-13 DIAGNOSIS — G40909 Epilepsy, unspecified, not intractable, without status epilepticus: Secondary | ICD-10-CM | POA: Diagnosis not present

## 2011-10-13 DIAGNOSIS — I10 Essential (primary) hypertension: Secondary | ICD-10-CM | POA: Diagnosis not present

## 2011-10-13 DIAGNOSIS — M1990 Unspecified osteoarthritis, unspecified site: Secondary | ICD-10-CM | POA: Diagnosis not present

## 2011-10-13 DIAGNOSIS — M6281 Muscle weakness (generalized): Secondary | ICD-10-CM | POA: Diagnosis not present

## 2011-10-13 DIAGNOSIS — R262 Difficulty in walking, not elsewhere classified: Secondary | ICD-10-CM | POA: Diagnosis not present

## 2011-10-13 DIAGNOSIS — Z9181 History of falling: Secondary | ICD-10-CM | POA: Diagnosis not present

## 2011-10-14 DIAGNOSIS — M6281 Muscle weakness (generalized): Secondary | ICD-10-CM | POA: Diagnosis not present

## 2011-10-14 DIAGNOSIS — R262 Difficulty in walking, not elsewhere classified: Secondary | ICD-10-CM | POA: Diagnosis not present

## 2011-10-14 DIAGNOSIS — G40909 Epilepsy, unspecified, not intractable, without status epilepticus: Secondary | ICD-10-CM | POA: Diagnosis not present

## 2011-10-14 DIAGNOSIS — I10 Essential (primary) hypertension: Secondary | ICD-10-CM | POA: Diagnosis not present

## 2011-10-14 DIAGNOSIS — M1990 Unspecified osteoarthritis, unspecified site: Secondary | ICD-10-CM | POA: Diagnosis not present

## 2011-10-14 DIAGNOSIS — Z9181 History of falling: Secondary | ICD-10-CM | POA: Diagnosis not present

## 2011-10-17 DIAGNOSIS — G40909 Epilepsy, unspecified, not intractable, without status epilepticus: Secondary | ICD-10-CM | POA: Diagnosis not present

## 2011-10-17 DIAGNOSIS — R262 Difficulty in walking, not elsewhere classified: Secondary | ICD-10-CM | POA: Diagnosis not present

## 2011-10-17 DIAGNOSIS — Z9181 History of falling: Secondary | ICD-10-CM | POA: Diagnosis not present

## 2011-10-17 DIAGNOSIS — M1990 Unspecified osteoarthritis, unspecified site: Secondary | ICD-10-CM | POA: Diagnosis not present

## 2011-10-17 DIAGNOSIS — M6281 Muscle weakness (generalized): Secondary | ICD-10-CM | POA: Diagnosis not present

## 2011-10-17 DIAGNOSIS — I10 Essential (primary) hypertension: Secondary | ICD-10-CM | POA: Diagnosis not present

## 2011-10-18 DIAGNOSIS — G40909 Epilepsy, unspecified, not intractable, without status epilepticus: Secondary | ICD-10-CM | POA: Diagnosis not present

## 2011-10-18 DIAGNOSIS — M1990 Unspecified osteoarthritis, unspecified site: Secondary | ICD-10-CM | POA: Diagnosis not present

## 2011-10-18 DIAGNOSIS — I10 Essential (primary) hypertension: Secondary | ICD-10-CM | POA: Diagnosis not present

## 2011-10-18 DIAGNOSIS — Z9181 History of falling: Secondary | ICD-10-CM | POA: Diagnosis not present

## 2011-10-18 DIAGNOSIS — M6281 Muscle weakness (generalized): Secondary | ICD-10-CM | POA: Diagnosis not present

## 2011-10-18 DIAGNOSIS — R262 Difficulty in walking, not elsewhere classified: Secondary | ICD-10-CM | POA: Diagnosis not present

## 2011-10-19 DIAGNOSIS — Z9181 History of falling: Secondary | ICD-10-CM | POA: Diagnosis not present

## 2011-10-19 DIAGNOSIS — M1990 Unspecified osteoarthritis, unspecified site: Secondary | ICD-10-CM | POA: Diagnosis not present

## 2011-10-19 DIAGNOSIS — G40909 Epilepsy, unspecified, not intractable, without status epilepticus: Secondary | ICD-10-CM | POA: Diagnosis not present

## 2011-10-19 DIAGNOSIS — R262 Difficulty in walking, not elsewhere classified: Secondary | ICD-10-CM | POA: Diagnosis not present

## 2011-10-19 DIAGNOSIS — M6281 Muscle weakness (generalized): Secondary | ICD-10-CM | POA: Diagnosis not present

## 2011-10-19 DIAGNOSIS — I10 Essential (primary) hypertension: Secondary | ICD-10-CM | POA: Diagnosis not present

## 2011-10-20 DIAGNOSIS — R262 Difficulty in walking, not elsewhere classified: Secondary | ICD-10-CM | POA: Diagnosis not present

## 2011-10-20 DIAGNOSIS — M1990 Unspecified osteoarthritis, unspecified site: Secondary | ICD-10-CM | POA: Diagnosis not present

## 2011-10-20 DIAGNOSIS — I10 Essential (primary) hypertension: Secondary | ICD-10-CM | POA: Diagnosis not present

## 2011-10-20 DIAGNOSIS — M6281 Muscle weakness (generalized): Secondary | ICD-10-CM | POA: Diagnosis not present

## 2011-10-20 DIAGNOSIS — Z9181 History of falling: Secondary | ICD-10-CM | POA: Diagnosis not present

## 2011-10-20 DIAGNOSIS — G40909 Epilepsy, unspecified, not intractable, without status epilepticus: Secondary | ICD-10-CM | POA: Diagnosis not present

## 2011-10-24 DIAGNOSIS — M6281 Muscle weakness (generalized): Secondary | ICD-10-CM | POA: Diagnosis not present

## 2011-10-24 DIAGNOSIS — G40909 Epilepsy, unspecified, not intractable, without status epilepticus: Secondary | ICD-10-CM | POA: Diagnosis not present

## 2011-10-24 DIAGNOSIS — R262 Difficulty in walking, not elsewhere classified: Secondary | ICD-10-CM | POA: Diagnosis not present

## 2011-10-24 DIAGNOSIS — I10 Essential (primary) hypertension: Secondary | ICD-10-CM | POA: Diagnosis not present

## 2011-10-24 DIAGNOSIS — Z9181 History of falling: Secondary | ICD-10-CM | POA: Diagnosis not present

## 2011-10-24 DIAGNOSIS — M1990 Unspecified osteoarthritis, unspecified site: Secondary | ICD-10-CM | POA: Diagnosis not present

## 2011-10-25 DIAGNOSIS — M6281 Muscle weakness (generalized): Secondary | ICD-10-CM | POA: Diagnosis not present

## 2011-10-25 DIAGNOSIS — R262 Difficulty in walking, not elsewhere classified: Secondary | ICD-10-CM | POA: Diagnosis not present

## 2011-10-25 DIAGNOSIS — Z9181 History of falling: Secondary | ICD-10-CM | POA: Diagnosis not present

## 2011-10-25 DIAGNOSIS — M1990 Unspecified osteoarthritis, unspecified site: Secondary | ICD-10-CM | POA: Diagnosis not present

## 2011-10-25 DIAGNOSIS — I10 Essential (primary) hypertension: Secondary | ICD-10-CM | POA: Diagnosis not present

## 2011-10-25 DIAGNOSIS — G40909 Epilepsy, unspecified, not intractable, without status epilepticus: Secondary | ICD-10-CM | POA: Diagnosis not present

## 2011-10-27 DIAGNOSIS — R262 Difficulty in walking, not elsewhere classified: Secondary | ICD-10-CM | POA: Diagnosis not present

## 2011-10-27 DIAGNOSIS — G40909 Epilepsy, unspecified, not intractable, without status epilepticus: Secondary | ICD-10-CM | POA: Diagnosis not present

## 2011-10-27 DIAGNOSIS — Z9181 History of falling: Secondary | ICD-10-CM | POA: Diagnosis not present

## 2011-10-27 DIAGNOSIS — M1990 Unspecified osteoarthritis, unspecified site: Secondary | ICD-10-CM | POA: Diagnosis not present

## 2011-10-27 DIAGNOSIS — I10 Essential (primary) hypertension: Secondary | ICD-10-CM | POA: Diagnosis not present

## 2011-10-27 DIAGNOSIS — M6281 Muscle weakness (generalized): Secondary | ICD-10-CM | POA: Diagnosis not present

## 2011-11-15 DIAGNOSIS — M810 Age-related osteoporosis without current pathological fracture: Secondary | ICD-10-CM | POA: Diagnosis not present

## 2011-11-15 DIAGNOSIS — G40909 Epilepsy, unspecified, not intractable, without status epilepticus: Secondary | ICD-10-CM | POA: Diagnosis not present

## 2011-11-15 DIAGNOSIS — E049 Nontoxic goiter, unspecified: Secondary | ICD-10-CM | POA: Diagnosis not present

## 2011-11-15 DIAGNOSIS — I10 Essential (primary) hypertension: Secondary | ICD-10-CM | POA: Diagnosis not present

## 2011-12-21 DIAGNOSIS — M6281 Muscle weakness (generalized): Secondary | ICD-10-CM | POA: Diagnosis not present

## 2011-12-21 DIAGNOSIS — M810 Age-related osteoporosis without current pathological fracture: Secondary | ICD-10-CM | POA: Diagnosis not present

## 2011-12-21 DIAGNOSIS — R269 Unspecified abnormalities of gait and mobility: Secondary | ICD-10-CM | POA: Diagnosis not present

## 2011-12-22 DIAGNOSIS — M810 Age-related osteoporosis without current pathological fracture: Secondary | ICD-10-CM | POA: Diagnosis not present

## 2011-12-22 DIAGNOSIS — M6281 Muscle weakness (generalized): Secondary | ICD-10-CM | POA: Diagnosis not present

## 2011-12-22 DIAGNOSIS — R7989 Other specified abnormal findings of blood chemistry: Secondary | ICD-10-CM | POA: Diagnosis not present

## 2011-12-22 DIAGNOSIS — R269 Unspecified abnormalities of gait and mobility: Secondary | ICD-10-CM | POA: Diagnosis not present

## 2011-12-23 DIAGNOSIS — R269 Unspecified abnormalities of gait and mobility: Secondary | ICD-10-CM | POA: Diagnosis not present

## 2011-12-23 DIAGNOSIS — M6281 Muscle weakness (generalized): Secondary | ICD-10-CM | POA: Diagnosis not present

## 2011-12-23 DIAGNOSIS — M810 Age-related osteoporosis without current pathological fracture: Secondary | ICD-10-CM | POA: Diagnosis not present

## 2011-12-24 DIAGNOSIS — R269 Unspecified abnormalities of gait and mobility: Secondary | ICD-10-CM | POA: Diagnosis not present

## 2011-12-24 DIAGNOSIS — M6281 Muscle weakness (generalized): Secondary | ICD-10-CM | POA: Diagnosis not present

## 2011-12-24 DIAGNOSIS — K219 Gastro-esophageal reflux disease without esophagitis: Secondary | ICD-10-CM | POA: Diagnosis not present

## 2011-12-24 DIAGNOSIS — M810 Age-related osteoporosis without current pathological fracture: Secondary | ICD-10-CM | POA: Diagnosis not present

## 2011-12-24 DIAGNOSIS — I10 Essential (primary) hypertension: Secondary | ICD-10-CM | POA: Diagnosis not present

## 2011-12-24 DIAGNOSIS — R5383 Other fatigue: Secondary | ICD-10-CM | POA: Diagnosis not present

## 2011-12-24 DIAGNOSIS — G40909 Epilepsy, unspecified, not intractable, without status epilepticus: Secondary | ICD-10-CM | POA: Diagnosis not present

## 2011-12-24 DIAGNOSIS — R5381 Other malaise: Secondary | ICD-10-CM | POA: Diagnosis not present

## 2011-12-25 DIAGNOSIS — R269 Unspecified abnormalities of gait and mobility: Secondary | ICD-10-CM | POA: Diagnosis not present

## 2011-12-25 DIAGNOSIS — M6281 Muscle weakness (generalized): Secondary | ICD-10-CM | POA: Diagnosis not present

## 2011-12-25 DIAGNOSIS — M810 Age-related osteoporosis without current pathological fracture: Secondary | ICD-10-CM | POA: Diagnosis not present

## 2011-12-26 DIAGNOSIS — M6281 Muscle weakness (generalized): Secondary | ICD-10-CM | POA: Diagnosis not present

## 2011-12-26 DIAGNOSIS — R269 Unspecified abnormalities of gait and mobility: Secondary | ICD-10-CM | POA: Diagnosis not present

## 2011-12-26 DIAGNOSIS — M810 Age-related osteoporosis without current pathological fracture: Secondary | ICD-10-CM | POA: Diagnosis not present

## 2011-12-27 DIAGNOSIS — R269 Unspecified abnormalities of gait and mobility: Secondary | ICD-10-CM | POA: Diagnosis not present

## 2011-12-27 DIAGNOSIS — M6281 Muscle weakness (generalized): Secondary | ICD-10-CM | POA: Diagnosis not present

## 2011-12-27 DIAGNOSIS — M810 Age-related osteoporosis without current pathological fracture: Secondary | ICD-10-CM | POA: Diagnosis not present

## 2011-12-28 DIAGNOSIS — M6281 Muscle weakness (generalized): Secondary | ICD-10-CM | POA: Diagnosis not present

## 2011-12-28 DIAGNOSIS — M810 Age-related osteoporosis without current pathological fracture: Secondary | ICD-10-CM | POA: Diagnosis not present

## 2011-12-28 DIAGNOSIS — R269 Unspecified abnormalities of gait and mobility: Secondary | ICD-10-CM | POA: Diagnosis not present

## 2011-12-29 DIAGNOSIS — M810 Age-related osteoporosis without current pathological fracture: Secondary | ICD-10-CM | POA: Diagnosis not present

## 2011-12-29 DIAGNOSIS — R269 Unspecified abnormalities of gait and mobility: Secondary | ICD-10-CM | POA: Diagnosis not present

## 2011-12-29 DIAGNOSIS — M6281 Muscle weakness (generalized): Secondary | ICD-10-CM | POA: Diagnosis not present

## 2011-12-30 DIAGNOSIS — R269 Unspecified abnormalities of gait and mobility: Secondary | ICD-10-CM | POA: Diagnosis not present

## 2011-12-30 DIAGNOSIS — M6281 Muscle weakness (generalized): Secondary | ICD-10-CM | POA: Diagnosis not present

## 2011-12-30 DIAGNOSIS — M810 Age-related osteoporosis without current pathological fracture: Secondary | ICD-10-CM | POA: Diagnosis not present

## 2012-01-01 DIAGNOSIS — M6281 Muscle weakness (generalized): Secondary | ICD-10-CM | POA: Diagnosis not present

## 2012-01-01 DIAGNOSIS — M810 Age-related osteoporosis without current pathological fracture: Secondary | ICD-10-CM | POA: Diagnosis not present

## 2012-01-01 DIAGNOSIS — R269 Unspecified abnormalities of gait and mobility: Secondary | ICD-10-CM | POA: Diagnosis not present

## 2012-01-02 DIAGNOSIS — M810 Age-related osteoporosis without current pathological fracture: Secondary | ICD-10-CM | POA: Diagnosis not present

## 2012-01-02 DIAGNOSIS — M6281 Muscle weakness (generalized): Secondary | ICD-10-CM | POA: Diagnosis not present

## 2012-01-02 DIAGNOSIS — R269 Unspecified abnormalities of gait and mobility: Secondary | ICD-10-CM | POA: Diagnosis not present

## 2012-01-03 DIAGNOSIS — M810 Age-related osteoporosis without current pathological fracture: Secondary | ICD-10-CM | POA: Diagnosis not present

## 2012-01-03 DIAGNOSIS — M6281 Muscle weakness (generalized): Secondary | ICD-10-CM | POA: Diagnosis not present

## 2012-01-03 DIAGNOSIS — R269 Unspecified abnormalities of gait and mobility: Secondary | ICD-10-CM | POA: Diagnosis not present

## 2012-01-04 DIAGNOSIS — R269 Unspecified abnormalities of gait and mobility: Secondary | ICD-10-CM | POA: Diagnosis not present

## 2012-01-04 DIAGNOSIS — M6281 Muscle weakness (generalized): Secondary | ICD-10-CM | POA: Diagnosis not present

## 2012-01-04 DIAGNOSIS — M810 Age-related osteoporosis without current pathological fracture: Secondary | ICD-10-CM | POA: Diagnosis not present

## 2012-01-05 DIAGNOSIS — M810 Age-related osteoporosis without current pathological fracture: Secondary | ICD-10-CM | POA: Diagnosis not present

## 2012-01-05 DIAGNOSIS — M6281 Muscle weakness (generalized): Secondary | ICD-10-CM | POA: Diagnosis not present

## 2012-01-05 DIAGNOSIS — R269 Unspecified abnormalities of gait and mobility: Secondary | ICD-10-CM | POA: Diagnosis not present

## 2012-01-06 DIAGNOSIS — R269 Unspecified abnormalities of gait and mobility: Secondary | ICD-10-CM | POA: Diagnosis not present

## 2012-01-06 DIAGNOSIS — M810 Age-related osteoporosis without current pathological fracture: Secondary | ICD-10-CM | POA: Diagnosis not present

## 2012-01-06 DIAGNOSIS — M6281 Muscle weakness (generalized): Secondary | ICD-10-CM | POA: Diagnosis not present

## 2012-01-07 DIAGNOSIS — M810 Age-related osteoporosis without current pathological fracture: Secondary | ICD-10-CM | POA: Diagnosis not present

## 2012-01-07 DIAGNOSIS — M6281 Muscle weakness (generalized): Secondary | ICD-10-CM | POA: Diagnosis not present

## 2012-01-07 DIAGNOSIS — R269 Unspecified abnormalities of gait and mobility: Secondary | ICD-10-CM | POA: Diagnosis not present

## 2012-01-08 DIAGNOSIS — M6281 Muscle weakness (generalized): Secondary | ICD-10-CM | POA: Diagnosis not present

## 2012-01-08 DIAGNOSIS — R269 Unspecified abnormalities of gait and mobility: Secondary | ICD-10-CM | POA: Diagnosis not present

## 2012-01-08 DIAGNOSIS — M810 Age-related osteoporosis without current pathological fracture: Secondary | ICD-10-CM | POA: Diagnosis not present

## 2012-01-09 DIAGNOSIS — M6281 Muscle weakness (generalized): Secondary | ICD-10-CM | POA: Diagnosis not present

## 2012-01-09 DIAGNOSIS — M810 Age-related osteoporosis without current pathological fracture: Secondary | ICD-10-CM | POA: Diagnosis not present

## 2012-01-09 DIAGNOSIS — R269 Unspecified abnormalities of gait and mobility: Secondary | ICD-10-CM | POA: Diagnosis not present

## 2012-01-10 DIAGNOSIS — M810 Age-related osteoporosis without current pathological fracture: Secondary | ICD-10-CM | POA: Diagnosis not present

## 2012-01-10 DIAGNOSIS — R269 Unspecified abnormalities of gait and mobility: Secondary | ICD-10-CM | POA: Diagnosis not present

## 2012-01-10 DIAGNOSIS — M6281 Muscle weakness (generalized): Secondary | ICD-10-CM | POA: Diagnosis not present

## 2012-01-12 DIAGNOSIS — R269 Unspecified abnormalities of gait and mobility: Secondary | ICD-10-CM | POA: Diagnosis not present

## 2012-01-12 DIAGNOSIS — M6281 Muscle weakness (generalized): Secondary | ICD-10-CM | POA: Diagnosis not present

## 2012-01-12 DIAGNOSIS — M810 Age-related osteoporosis without current pathological fracture: Secondary | ICD-10-CM | POA: Diagnosis not present

## 2012-01-13 DIAGNOSIS — M6281 Muscle weakness (generalized): Secondary | ICD-10-CM | POA: Diagnosis not present

## 2012-01-13 DIAGNOSIS — M810 Age-related osteoporosis without current pathological fracture: Secondary | ICD-10-CM | POA: Diagnosis not present

## 2012-01-13 DIAGNOSIS — R269 Unspecified abnormalities of gait and mobility: Secondary | ICD-10-CM | POA: Diagnosis not present

## 2012-01-14 DIAGNOSIS — R269 Unspecified abnormalities of gait and mobility: Secondary | ICD-10-CM | POA: Diagnosis not present

## 2012-01-14 DIAGNOSIS — M810 Age-related osteoporosis without current pathological fracture: Secondary | ICD-10-CM | POA: Diagnosis not present

## 2012-01-14 DIAGNOSIS — M6281 Muscle weakness (generalized): Secondary | ICD-10-CM | POA: Diagnosis not present

## 2012-01-16 DIAGNOSIS — M6281 Muscle weakness (generalized): Secondary | ICD-10-CM | POA: Diagnosis not present

## 2012-01-16 DIAGNOSIS — M810 Age-related osteoporosis without current pathological fracture: Secondary | ICD-10-CM | POA: Diagnosis not present

## 2012-01-16 DIAGNOSIS — R269 Unspecified abnormalities of gait and mobility: Secondary | ICD-10-CM | POA: Diagnosis not present

## 2012-01-17 DIAGNOSIS — M6281 Muscle weakness (generalized): Secondary | ICD-10-CM | POA: Diagnosis not present

## 2012-01-17 DIAGNOSIS — R269 Unspecified abnormalities of gait and mobility: Secondary | ICD-10-CM | POA: Diagnosis not present

## 2012-01-17 DIAGNOSIS — M810 Age-related osteoporosis without current pathological fracture: Secondary | ICD-10-CM | POA: Diagnosis not present

## 2012-01-18 DIAGNOSIS — R488 Other symbolic dysfunctions: Secondary | ICD-10-CM | POA: Diagnosis not present

## 2012-01-18 DIAGNOSIS — R269 Unspecified abnormalities of gait and mobility: Secondary | ICD-10-CM | POA: Diagnosis not present

## 2012-01-18 DIAGNOSIS — M810 Age-related osteoporosis without current pathological fracture: Secondary | ICD-10-CM | POA: Diagnosis not present

## 2012-01-18 DIAGNOSIS — M6281 Muscle weakness (generalized): Secondary | ICD-10-CM | POA: Diagnosis not present

## 2012-01-18 DIAGNOSIS — R262 Difficulty in walking, not elsewhere classified: Secondary | ICD-10-CM | POA: Diagnosis not present

## 2012-01-27 DIAGNOSIS — R300 Dysuria: Secondary | ICD-10-CM | POA: Diagnosis not present

## 2012-01-28 DIAGNOSIS — R300 Dysuria: Secondary | ICD-10-CM | POA: Diagnosis not present

## 2012-02-07 DIAGNOSIS — M810 Age-related osteoporosis without current pathological fracture: Secondary | ICD-10-CM | POA: Diagnosis not present

## 2012-02-07 DIAGNOSIS — I10 Essential (primary) hypertension: Secondary | ICD-10-CM | POA: Diagnosis not present

## 2012-02-07 DIAGNOSIS — K219 Gastro-esophageal reflux disease without esophagitis: Secondary | ICD-10-CM | POA: Diagnosis not present

## 2012-02-07 DIAGNOSIS — G40909 Epilepsy, unspecified, not intractable, without status epilepticus: Secondary | ICD-10-CM | POA: Diagnosis not present

## 2012-02-16 DIAGNOSIS — G40909 Epilepsy, unspecified, not intractable, without status epilepticus: Secondary | ICD-10-CM | POA: Diagnosis not present

## 2012-02-18 DIAGNOSIS — M810 Age-related osteoporosis without current pathological fracture: Secondary | ICD-10-CM | POA: Diagnosis not present

## 2012-02-18 DIAGNOSIS — M6281 Muscle weakness (generalized): Secondary | ICD-10-CM | POA: Diagnosis not present

## 2012-02-18 DIAGNOSIS — R262 Difficulty in walking, not elsewhere classified: Secondary | ICD-10-CM | POA: Diagnosis not present

## 2012-02-18 DIAGNOSIS — R488 Other symbolic dysfunctions: Secondary | ICD-10-CM | POA: Diagnosis not present

## 2012-02-18 DIAGNOSIS — R269 Unspecified abnormalities of gait and mobility: Secondary | ICD-10-CM | POA: Diagnosis not present

## 2012-02-22 DIAGNOSIS — G40909 Epilepsy, unspecified, not intractable, without status epilepticus: Secondary | ICD-10-CM | POA: Diagnosis not present

## 2012-03-05 DIAGNOSIS — H353190 Nonexudative age-related macular degeneration, unspecified eye, stage unspecified: Secondary | ICD-10-CM | POA: Diagnosis not present

## 2012-03-05 DIAGNOSIS — H1010 Acute atopic conjunctivitis, unspecified eye: Secondary | ICD-10-CM | POA: Diagnosis not present

## 2012-03-05 DIAGNOSIS — H26499 Other secondary cataract, unspecified eye: Secondary | ICD-10-CM | POA: Diagnosis not present

## 2012-03-06 DIAGNOSIS — G40909 Epilepsy, unspecified, not intractable, without status epilepticus: Secondary | ICD-10-CM | POA: Diagnosis not present

## 2012-03-17 DIAGNOSIS — M810 Age-related osteoporosis without current pathological fracture: Secondary | ICD-10-CM | POA: Diagnosis not present

## 2012-03-17 DIAGNOSIS — M6281 Muscle weakness (generalized): Secondary | ICD-10-CM | POA: Diagnosis not present

## 2012-03-17 DIAGNOSIS — R488 Other symbolic dysfunctions: Secondary | ICD-10-CM | POA: Diagnosis not present

## 2012-03-17 DIAGNOSIS — R269 Unspecified abnormalities of gait and mobility: Secondary | ICD-10-CM | POA: Diagnosis not present

## 2012-03-17 DIAGNOSIS — R262 Difficulty in walking, not elsewhere classified: Secondary | ICD-10-CM | POA: Diagnosis not present

## 2012-03-18 DIAGNOSIS — M810 Age-related osteoporosis without current pathological fracture: Secondary | ICD-10-CM | POA: Diagnosis not present

## 2012-03-18 DIAGNOSIS — R262 Difficulty in walking, not elsewhere classified: Secondary | ICD-10-CM | POA: Diagnosis not present

## 2012-03-18 DIAGNOSIS — R488 Other symbolic dysfunctions: Secondary | ICD-10-CM | POA: Diagnosis not present

## 2012-03-18 DIAGNOSIS — R269 Unspecified abnormalities of gait and mobility: Secondary | ICD-10-CM | POA: Diagnosis not present

## 2012-03-18 DIAGNOSIS — M6281 Muscle weakness (generalized): Secondary | ICD-10-CM | POA: Diagnosis not present

## 2012-03-19 DIAGNOSIS — M810 Age-related osteoporosis without current pathological fracture: Secondary | ICD-10-CM | POA: Diagnosis not present

## 2012-03-19 DIAGNOSIS — R269 Unspecified abnormalities of gait and mobility: Secondary | ICD-10-CM | POA: Diagnosis not present

## 2012-03-19 DIAGNOSIS — R488 Other symbolic dysfunctions: Secondary | ICD-10-CM | POA: Diagnosis not present

## 2012-03-19 DIAGNOSIS — R262 Difficulty in walking, not elsewhere classified: Secondary | ICD-10-CM | POA: Diagnosis not present

## 2012-03-19 DIAGNOSIS — M6281 Muscle weakness (generalized): Secondary | ICD-10-CM | POA: Diagnosis not present

## 2012-03-20 DIAGNOSIS — R262 Difficulty in walking, not elsewhere classified: Secondary | ICD-10-CM | POA: Diagnosis not present

## 2012-03-20 DIAGNOSIS — M810 Age-related osteoporosis without current pathological fracture: Secondary | ICD-10-CM | POA: Diagnosis not present

## 2012-03-20 DIAGNOSIS — M6281 Muscle weakness (generalized): Secondary | ICD-10-CM | POA: Diagnosis not present

## 2012-03-20 DIAGNOSIS — R488 Other symbolic dysfunctions: Secondary | ICD-10-CM | POA: Diagnosis not present

## 2012-03-20 DIAGNOSIS — R269 Unspecified abnormalities of gait and mobility: Secondary | ICD-10-CM | POA: Diagnosis not present

## 2012-03-21 DIAGNOSIS — R262 Difficulty in walking, not elsewhere classified: Secondary | ICD-10-CM | POA: Diagnosis not present

## 2012-03-21 DIAGNOSIS — R269 Unspecified abnormalities of gait and mobility: Secondary | ICD-10-CM | POA: Diagnosis not present

## 2012-03-21 DIAGNOSIS — M810 Age-related osteoporosis without current pathological fracture: Secondary | ICD-10-CM | POA: Diagnosis not present

## 2012-03-21 DIAGNOSIS — R488 Other symbolic dysfunctions: Secondary | ICD-10-CM | POA: Diagnosis not present

## 2012-03-21 DIAGNOSIS — M6281 Muscle weakness (generalized): Secondary | ICD-10-CM | POA: Diagnosis not present

## 2012-03-22 DIAGNOSIS — M810 Age-related osteoporosis without current pathological fracture: Secondary | ICD-10-CM | POA: Diagnosis not present

## 2012-03-22 DIAGNOSIS — M6281 Muscle weakness (generalized): Secondary | ICD-10-CM | POA: Diagnosis not present

## 2012-03-22 DIAGNOSIS — R269 Unspecified abnormalities of gait and mobility: Secondary | ICD-10-CM | POA: Diagnosis not present

## 2012-03-22 DIAGNOSIS — R488 Other symbolic dysfunctions: Secondary | ICD-10-CM | POA: Diagnosis not present

## 2012-03-22 DIAGNOSIS — R262 Difficulty in walking, not elsewhere classified: Secondary | ICD-10-CM | POA: Diagnosis not present

## 2012-03-23 DIAGNOSIS — R269 Unspecified abnormalities of gait and mobility: Secondary | ICD-10-CM | POA: Diagnosis not present

## 2012-03-23 DIAGNOSIS — M810 Age-related osteoporosis without current pathological fracture: Secondary | ICD-10-CM | POA: Diagnosis not present

## 2012-03-23 DIAGNOSIS — M6281 Muscle weakness (generalized): Secondary | ICD-10-CM | POA: Diagnosis not present

## 2012-03-23 DIAGNOSIS — R262 Difficulty in walking, not elsewhere classified: Secondary | ICD-10-CM | POA: Diagnosis not present

## 2012-03-23 DIAGNOSIS — R488 Other symbolic dysfunctions: Secondary | ICD-10-CM | POA: Diagnosis not present

## 2012-03-25 DIAGNOSIS — R269 Unspecified abnormalities of gait and mobility: Secondary | ICD-10-CM | POA: Diagnosis not present

## 2012-03-25 DIAGNOSIS — M6281 Muscle weakness (generalized): Secondary | ICD-10-CM | POA: Diagnosis not present

## 2012-03-25 DIAGNOSIS — R488 Other symbolic dysfunctions: Secondary | ICD-10-CM | POA: Diagnosis not present

## 2012-03-25 DIAGNOSIS — M810 Age-related osteoporosis without current pathological fracture: Secondary | ICD-10-CM | POA: Diagnosis not present

## 2012-03-25 DIAGNOSIS — R262 Difficulty in walking, not elsewhere classified: Secondary | ICD-10-CM | POA: Diagnosis not present

## 2012-03-26 DIAGNOSIS — R488 Other symbolic dysfunctions: Secondary | ICD-10-CM | POA: Diagnosis not present

## 2012-03-26 DIAGNOSIS — R269 Unspecified abnormalities of gait and mobility: Secondary | ICD-10-CM | POA: Diagnosis not present

## 2012-03-26 DIAGNOSIS — H26499 Other secondary cataract, unspecified eye: Secondary | ICD-10-CM | POA: Diagnosis not present

## 2012-03-26 DIAGNOSIS — M810 Age-related osteoporosis without current pathological fracture: Secondary | ICD-10-CM | POA: Diagnosis not present

## 2012-03-26 DIAGNOSIS — R262 Difficulty in walking, not elsewhere classified: Secondary | ICD-10-CM | POA: Diagnosis not present

## 2012-03-26 DIAGNOSIS — M6281 Muscle weakness (generalized): Secondary | ICD-10-CM | POA: Diagnosis not present

## 2012-03-27 DIAGNOSIS — R488 Other symbolic dysfunctions: Secondary | ICD-10-CM | POA: Diagnosis not present

## 2012-03-27 DIAGNOSIS — M810 Age-related osteoporosis without current pathological fracture: Secondary | ICD-10-CM | POA: Diagnosis not present

## 2012-03-27 DIAGNOSIS — M6281 Muscle weakness (generalized): Secondary | ICD-10-CM | POA: Diagnosis not present

## 2012-03-27 DIAGNOSIS — R262 Difficulty in walking, not elsewhere classified: Secondary | ICD-10-CM | POA: Diagnosis not present

## 2012-03-27 DIAGNOSIS — R269 Unspecified abnormalities of gait and mobility: Secondary | ICD-10-CM | POA: Diagnosis not present

## 2012-03-28 DIAGNOSIS — R269 Unspecified abnormalities of gait and mobility: Secondary | ICD-10-CM | POA: Diagnosis not present

## 2012-03-28 DIAGNOSIS — R262 Difficulty in walking, not elsewhere classified: Secondary | ICD-10-CM | POA: Diagnosis not present

## 2012-03-28 DIAGNOSIS — M6281 Muscle weakness (generalized): Secondary | ICD-10-CM | POA: Diagnosis not present

## 2012-03-28 DIAGNOSIS — R488 Other symbolic dysfunctions: Secondary | ICD-10-CM | POA: Diagnosis not present

## 2012-03-28 DIAGNOSIS — M810 Age-related osteoporosis without current pathological fracture: Secondary | ICD-10-CM | POA: Diagnosis not present

## 2012-03-29 DIAGNOSIS — M6281 Muscle weakness (generalized): Secondary | ICD-10-CM | POA: Diagnosis not present

## 2012-03-29 DIAGNOSIS — M810 Age-related osteoporosis without current pathological fracture: Secondary | ICD-10-CM | POA: Diagnosis not present

## 2012-03-29 DIAGNOSIS — R262 Difficulty in walking, not elsewhere classified: Secondary | ICD-10-CM | POA: Diagnosis not present

## 2012-03-29 DIAGNOSIS — R6889 Other general symptoms and signs: Secondary | ICD-10-CM | POA: Diagnosis not present

## 2012-03-29 DIAGNOSIS — R269 Unspecified abnormalities of gait and mobility: Secondary | ICD-10-CM | POA: Diagnosis not present

## 2012-03-29 DIAGNOSIS — R488 Other symbolic dysfunctions: Secondary | ICD-10-CM | POA: Diagnosis not present

## 2012-04-05 DIAGNOSIS — R6889 Other general symptoms and signs: Secondary | ICD-10-CM | POA: Diagnosis not present

## 2012-04-11 DIAGNOSIS — H26499 Other secondary cataract, unspecified eye: Secondary | ICD-10-CM | POA: Diagnosis not present

## 2012-04-30 DIAGNOSIS — Z961 Presence of intraocular lens: Secondary | ICD-10-CM | POA: Diagnosis not present

## 2012-05-07 DIAGNOSIS — I10 Essential (primary) hypertension: Secondary | ICD-10-CM | POA: Diagnosis not present

## 2012-05-07 DIAGNOSIS — M810 Age-related osteoporosis without current pathological fracture: Secondary | ICD-10-CM | POA: Diagnosis not present

## 2012-05-07 DIAGNOSIS — G2581 Restless legs syndrome: Secondary | ICD-10-CM | POA: Diagnosis not present

## 2012-05-07 DIAGNOSIS — E039 Hypothyroidism, unspecified: Secondary | ICD-10-CM | POA: Diagnosis not present

## 2012-06-08 DIAGNOSIS — M2040 Other hammer toe(s) (acquired), unspecified foot: Secondary | ICD-10-CM | POA: Diagnosis not present

## 2012-06-28 DIAGNOSIS — E039 Hypothyroidism, unspecified: Secondary | ICD-10-CM | POA: Diagnosis not present

## 2012-06-28 DIAGNOSIS — I10 Essential (primary) hypertension: Secondary | ICD-10-CM | POA: Diagnosis not present

## 2012-06-28 DIAGNOSIS — E785 Hyperlipidemia, unspecified: Secondary | ICD-10-CM | POA: Diagnosis not present

## 2012-06-29 DIAGNOSIS — E559 Vitamin D deficiency, unspecified: Secondary | ICD-10-CM | POA: Diagnosis not present

## 2012-06-29 DIAGNOSIS — I10 Essential (primary) hypertension: Secondary | ICD-10-CM | POA: Diagnosis not present

## 2012-10-16 DIAGNOSIS — I10 Essential (primary) hypertension: Secondary | ICD-10-CM | POA: Diagnosis not present

## 2012-10-16 DIAGNOSIS — R569 Unspecified convulsions: Secondary | ICD-10-CM | POA: Diagnosis not present

## 2012-10-16 DIAGNOSIS — F329 Major depressive disorder, single episode, unspecified: Secondary | ICD-10-CM | POA: Diagnosis not present

## 2012-10-18 DIAGNOSIS — F329 Major depressive disorder, single episode, unspecified: Secondary | ICD-10-CM | POA: Diagnosis not present

## 2012-10-18 DIAGNOSIS — R569 Unspecified convulsions: Secondary | ICD-10-CM | POA: Diagnosis not present

## 2012-10-18 DIAGNOSIS — I10 Essential (primary) hypertension: Secondary | ICD-10-CM | POA: Diagnosis not present

## 2012-10-26 ENCOUNTER — Emergency Department (HOSPITAL_COMMUNITY)
Admission: EM | Admit: 2012-10-26 | Discharge: 2012-10-26 | Disposition: A | Discharge status: Home / Self Care | Payer: Medicare Other | Attending: Emergency Medicine | Admitting: Emergency Medicine

## 2012-10-26 ENCOUNTER — Encounter (HOSPITAL_COMMUNITY): Payer: Self-pay | Admitting: Emergency Medicine

## 2012-10-26 ENCOUNTER — Emergency Department (HOSPITAL_COMMUNITY): Payer: Medicare Other

## 2012-10-26 DIAGNOSIS — Z8619 Personal history of other infectious and parasitic diseases: Secondary | ICD-10-CM | POA: Diagnosis not present

## 2012-10-26 DIAGNOSIS — Z860100 Personal history of colon polyps, unspecified: Secondary | ICD-10-CM | POA: Insufficient documentation

## 2012-10-26 DIAGNOSIS — F329 Major depressive disorder, single episode, unspecified: Secondary | ICD-10-CM | POA: Insufficient documentation

## 2012-10-26 DIAGNOSIS — F411 Generalized anxiety disorder: Secondary | ICD-10-CM | POA: Diagnosis not present

## 2012-10-26 DIAGNOSIS — I10 Essential (primary) hypertension: Secondary | ICD-10-CM | POA: Insufficient documentation

## 2012-10-26 DIAGNOSIS — R197 Diarrhea, unspecified: Secondary | ICD-10-CM | POA: Diagnosis not present

## 2012-10-26 DIAGNOSIS — F3289 Other specified depressive episodes: Secondary | ICD-10-CM | POA: Insufficient documentation

## 2012-10-26 DIAGNOSIS — R059 Cough, unspecified: Secondary | ICD-10-CM | POA: Insufficient documentation

## 2012-10-26 DIAGNOSIS — G40909 Epilepsy, unspecified, not intractable, without status epilepticus: Secondary | ICD-10-CM | POA: Diagnosis not present

## 2012-10-26 DIAGNOSIS — R5381 Other malaise: Secondary | ICD-10-CM

## 2012-10-26 DIAGNOSIS — R531 Weakness: Secondary | ICD-10-CM

## 2012-10-26 DIAGNOSIS — Z79899 Other long term (current) drug therapy: Secondary | ICD-10-CM | POA: Diagnosis not present

## 2012-10-26 DIAGNOSIS — D649 Anemia, unspecified: Secondary | ICD-10-CM | POA: Diagnosis not present

## 2012-10-26 DIAGNOSIS — R05 Cough: Secondary | ICD-10-CM | POA: Insufficient documentation

## 2012-10-26 HISTORY — DX: Anemia, unspecified: D64.9

## 2012-10-26 LAB — URINALYSIS, ROUTINE W REFLEX MICROSCOPIC
Bilirubin Urine: NEGATIVE
Hgb urine dipstick: NEGATIVE
Ketones, ur: NEGATIVE mg/dL
Specific Gravity, Urine: 1.015 (ref 1.005–1.030)
Urobilinogen, UA: 0.2 mg/dL (ref 0.0–1.0)

## 2012-10-26 LAB — COMPREHENSIVE METABOLIC PANEL
ALT: 13 U/L (ref 0–35)
Alkaline Phosphatase: 84 U/L (ref 39–117)
CO2: 27 mEq/L (ref 19–32)
Chloride: 98 mEq/L (ref 96–112)
GFR calc Af Amer: 80 mL/min — ABNORMAL LOW (ref >90)
GFR calc non Af Amer: 69 mL/min — ABNORMAL LOW (ref >90)
Glucose, Bld: 94 mg/dL (ref 70–99)
Potassium: 4.4 mEq/L (ref 3.5–5.1)
Sodium: 135 mEq/L (ref 135–145)
Total Bilirubin: <0.1 mg/dL — ABNORMAL LOW (ref 0.3–1.2)
Total Protein: 7.7 g/dL (ref 6.0–8.3)

## 2012-10-26 LAB — CBC
Hemoglobin: 8.2 g/dL — ABNORMAL LOW (ref 12.0–15.0)
Platelets: 333 10*3/uL (ref 150–400)
RBC: 4.02 MIL/uL (ref 3.87–5.11)
WBC: 4.8 10*3/uL (ref 4.0–10.5)

## 2012-10-26 LAB — IRON AND TIBC
Iron: 13 ug/dL — ABNORMAL LOW (ref 42–135)
Saturation Ratios: 3 % — ABNORMAL LOW (ref 20–55)
TIBC: 425 ug/dL (ref 250–470)
UIBC: 412 ug/dL — ABNORMAL HIGH (ref 125–400)

## 2012-10-26 LAB — DIFFERENTIAL
Lymphocytes Relative: 18 % (ref 12–46)
Lymphs Abs: 0.9 10*3/uL (ref 0.7–4.0)
Monocytes Relative: 9 % (ref 3–12)
Neutrophils Relative %: 71 % (ref 43–77)

## 2012-10-26 LAB — RETICULOCYTES: Retic Ct Pct: 1.4 % (ref 0.4–3.1)

## 2012-10-26 LAB — PROTIME-INR
INR: 1.13 (ref 0.00–1.49)
Prothrombin Time: 14.3 seconds (ref 11.6–15.2)

## 2012-10-26 LAB — TYPE AND SCREEN
ABO/RH(D): A NEG
Antibody Screen: NEGATIVE

## 2012-10-26 LAB — POCT I-STAT TROPONIN I: Troponin i, poc: 0.01 ng/mL (ref 0.00–0.08)

## 2012-10-26 NOTE — ED Notes (Signed)
Pt's family member reports that she has been weak for 1 week, and her bp has been elevated since 6am today. Also having freq. Bm's,, denies diarrhea, takes multiple stool softeners/laxatives.  No fever or nausea.  Recently changed her bp med. Recently diagnosed w/ anemia

## 2012-10-26 NOTE — ED Notes (Signed)
Per hospitalist pt is ok to d/c home.  Her niece is also ok with this.

## 2012-10-26 NOTE — ED Provider Notes (Addendum)
CSN: [id number]     Arrival date & time 10/26/12  0900 History  This chart was scribed for Kathalene Frames, MD by Jenne Campus, ED Scribe. This patient was seen in room APA19/APA19 and the patient's care was started at 9:42 AM.   Chief Complaint  Patient presents with  . Weakness  . Hypertension    The history is provided by the patient and a relative. No language interpreter was used.    HPI Comments: Brittney Reynolds is a 79 y.o. female who presents to the Emergency Department complaining of one week of gradual onset, gradually worsening, constant generalized weakness with associated increased BMs. Relative reports 6 BMs yesterday and 3 this morning. She admits that the pt was on colace and miralax due to chronic constipation. She stopped the colace earlier this week but continues to have increased BMs. She was recently evaluated by her PCP for the same and was diagnosed with anemia through bloodwork. No rectal exam was performed. Pt denies any prior h/o anemia. Relative also reports concern over elevated BP this morning of 183/95. Relative states that the pt has a h/o HTN and had her medications changed by her PCP one week ago due to her BP being uncontrolled on the prior dosage. BP in the ED is currently 173/68. Pt denies melena, hematochezia, emesis, extremity weakness, numbness, cough, CP and fevers as associated symptoms.    Past Medical History  Diagnosis Date  . Polio     As a child  . Anxiety   . Depression   . HTN (hypertension)   . Seizures   . Colon polyp-over 200 per pt 5 yrs ago    in  [place]  . Anemia    Past Surgical History  Procedure Laterality Date  . Otif  2009    Right ankle  . Foot surgery  11/11    Left foot   No family history on file. History  Substance Use Topics  . Smoking status: Never Smoker   . Smokeless tobacco: Not on file  . Alcohol Use: No   No OB history provided.  Review of Systems  Constitutional: Negative for fever.   Respiratory: Positive for cough (at night). Negative for shortness of breath.   Cardiovascular: Negative for chest pain.  Gastrointestinal: Negative for nausea, vomiting, diarrhea and blood in stool.  Neurological: Positive for weakness. Negative for syncope and numbness.  A complete 10 system review of systems was obtained and all systems are negative except as noted in the HPI and PMH.   Allergies  Review of patient's allergies indicates no known allergies.  Home Medications   Current Outpatient Rx  Name  Route  Sig  Dispense  Refill  . amLODipine (NORVASC) 10 MG tablet   Oral   Take 10 mg by mouth daily.         . Calcium Carbonate-Vitamin D (CALCIUM + D PO)   Oral   Take 600 mg by mouth.           . escitalopram (LEXAPRO) 10 MG tablet   Oral   Take 10 mg by mouth daily.           Marland Kitchen oxybutynin (DITROPAN-XL) 5 MG 24 hr tablet   Oral   Take 5 mg by mouth daily.         . pantoprazole (PROTONIX) 40 MG tablet   Oral   Take 40 mg by mouth daily.         Marland Kitchen  phenytoin (DILANTIN) 100 MG ER capsule   Oral   Take 100 mg by mouth 2 (two) times daily.          . primidone (MYSOLINE) 50 MG tablet   Oral   Take 200 mg by mouth 3 (three) times daily.         . raloxifene (EVISTA) 60 MG tablet   Oral   Take 60 mg by mouth daily.           Marland Kitchen diltiazem (TIAZAC) 360 MG 24 hr capsule   Oral   Take 360 mg by mouth daily.         . metoprolol succinate (TOPROL-XL) 25 MG 24 hr tablet   Oral   Take 25 mg by mouth daily.            Triage Vitals: BP 173/68  Pulse 79  Temp(Src) 98 F (36.7 C) (Oral)  Resp 18  Ht 5\' 6"  (1.676 m)  Wt 166 lb (75.297 kg)  BMI 26.81 kg/m2  SpO2 100%  Physical Exam  Nursing note and vitals reviewed. Constitutional: She appears well-developed and well-nourished. No distress.  HENT:  Head: Normocephalic and atraumatic.  Right Ear: External ear normal.  Left Ear: External ear normal.  Eyes: Conjunctivae are normal. Right  eye exhibits no discharge. Left eye exhibits no discharge. No scleral icterus.  Neck: Neck supple. No tracheal deviation present.  Cardiovascular: Normal rate, regular rhythm and intact distal pulses.   Pulmonary/Chest: Effort normal and breath sounds normal. No stridor. No respiratory distress. She has no wheezes. She has no rales.  Abdominal: Soft. Bowel sounds are normal. She exhibits no distension. There is no tenderness. There is no rebound and no guarding.  Musculoskeletal: She exhibits no edema and no tenderness.  Neurological: She is alert. She has normal strength. No sensory deficit. Cranial nerve deficit:  no gross defecits noted. She exhibits normal muscle tone. She displays no seizure activity. Coordination normal.  Skin: Skin is warm and dry. No rash noted.  Psychiatric: She has a normal mood and affect.    ED Course  Procedures (including critical care time)  DIAGNOSTIC STUDIES: Oxygen Saturation is 100% on room air, normal by my interpretation.    COORDINATION OF CARE: 9:45 AM-Discussed treatment plan which includes CXR, CBC panel, CMP and UA with pt at bedside and pt agreed to plan.   Labs Review Labs Reviewed  CBC - Abnormal; Notable for the following:    Hemoglobin 8.2 (*)    HCT 28.1 (*)    MCV 69.9 (*)    MCH 20.4 (*)    MCHC 29.2 (*)    RDW 18.2 (*)    All other components within normal limits  COMPREHENSIVE METABOLIC PANEL - Abnormal; Notable for the following:    Total Bilirubin <0.1 (*)    GFR calc non Af Amer 69 (*)    GFR calc Af Amer 80 (*)    All other components within normal limits  PROTIME-INR  APTT  DIFFERENTIAL  TROPONIN I  URINALYSIS, ROUTINE W REFLEX MICROSCOPIC  GLUCOSE, CAPILLARY  VITAMIN B12  FOLATE  IRON AND TIBC  FERRITIN  RETICULOCYTES  OCCULT BLOOD, POC DEVICE  POCT I-STAT TROPONIN I  TYPE AND SCREEN   Imaging Review Dg Chest 2 View  10/26/2012   CLINICAL DATA:  Weakness. Hypertension.  EXAM: CHEST  2 VIEW  COMPARISON:   Multiple priors.  FINDINGS: The cardiomediastinal silhouette is unchanged. No focal infiltrate or edema. No pleural effusion  or pneumothorax. No acute osseous abnormality. Hiatal hernia is noted.  IMPRESSION: No active cardiopulmonary disease.   Electronically Signed   By: Donavan Burnet M.D.   On: 10/26/2012 11:32    EKG Interpretation     Ventricular Rate:  71 PR Interval:  148 QRS Duration: 90 QT Interval:  408 QTC Calculation: 443 R Axis:   36 Text Interpretation:  Normal sinus rhythm Normal ECG When compared with ECG of 23-Jun-2010 19:15, T wave amplitude has increased in Inferior leads            MDM   1. Weakness   2. Anemia    The patient presented to the emergency department with progressive weakness. Patient does have significant anemia that is new. 2 results 2 years ago. Hemoglobin is 8.2. Initial guaiac was negative however patient still may have an occult GI bleed her worsening symptoms and degree of anemia consult the medical service regarding admission and further evaluation. May be reasonable to transfuse blood to see if her symptoms improve after that treatment.  No focal neurologic symptoms, I doubt acute stroke  I personally performed the services described in this documentation, which was scribed in my presence.  The recorded information has been reviewed and is accurate.    Kathalene Frames, MD 10/26/12 1206  Pt was seen by the hospitalist, Dr. Jerilee Hoh.  She discussed options with patient and caregiver.  They are comfortable with outpatient follow up.  Kathalene Frames, MD 10/26/12 [phone]

## 2012-10-26 NOTE — Consult Note (Signed)
Requesting physician: Dr. Dorie Rank, EDP  Primary Care Physician: Alonza Bogus, MD  Reason for consultation: Potential admission for weakness, anemia   History of Present Illness: Brittney Reynolds is a pleasant 79 year old woman who is evaluated today in the presence of Brittney caregiver. Brittney Reynolds's main healthcare power of attorney is Brittney Reynolds was unavailable by phone at the moment. Brittney Reynolds's caregiver states that she has been maintained on a regimen of Colace and MiraLAX for constipation, however over the course of the last 2 weeks she has had intermittent bouts of diarrhea for which they have de-escalated Brittney laxative regimen. Brittney Reynolds normally only ambulates minimally with a walker but can transfer to wheelchair on Brittney own and is still able to perform this. She had blood work done at Dr. Luan Pulling office last week and Brittney hemoglobin was reported at 8, they received a letter from his office asking him to followup with him in regards to this value. Today Brittney Reynolds's caregiver decided to bring Brittney into the emergency room for evaluation given the fact that Dr. Luan Pulling office is closed, Brittney known anemia and Brittney slightly increased weakness. We have been asked to evaluate Brittney for potential admission and further recommendations. Brittney Reynolds currently states that she feels better, she would prefer not to stay in the hospital if possible, she says she is able to transfer to Brittney wheelchair on Brittney own, there is no evidence for confusion.  Allergies:  No Known Allergies    Past Medical History  Diagnosis Date  . Polio     As a child  . Anxiety   . Depression   . HTN (hypertension)   . Seizures   . Colon polyp 5 yrs ago    in  [place]  . Anemia     Past Surgical History  Procedure Laterality Date  . Otif  2009    Right ankle  . Foot surgery  11/11    Left foot    Scheduled Meds: Continuous Infusions: PRN Meds:.    Social History:  reports that she has never smoked. She does not have any  smokeless tobacco history on file. She reports that she does not drink alcohol or use illicit drugs.  No family history on file.  Review of Systems:  Constitutional: Denies fever, chills, diaphoresis, appetite change and fatigue.  HEENT: Denies photophobia, eye pain, redness, hearing loss, ear pain, congestion, sore throat, rhinorrhea, sneezing, mouth sores, trouble swallowing, neck pain, neck stiffness and tinnitus.   Respiratory: Denies SOB, DOE, cough, chest tightness,  and wheezing.   Cardiovascular: Denies chest pain, palpitations and leg swelling.  Gastrointestinal: Denies nausea, vomiting, abdominal pain, diarrhea, constipation, blood in stool and abdominal distention.  Genitourinary: Denies dysuria, urgency, frequency, hematuria, flank pain and difficulty urinating.  Endocrine: Denies: hot or cold intolerance, sweats, changes in hair or nails, polyuria, polydipsia. Musculoskeletal: Denies myalgias, back pain, joint swelling, arthralgias and gait problem.  Skin: Denies pallor, rash and wound.  Neurological: Denies dizziness, seizures, syncope, weakness, light-headedness, numbness and headaches.  Hematological: Denies adenopathy. Easy bruising, personal or family bleeding history  Psychiatric/Behavioral: Denies suicidal ideation, mood changes, confusion, nervousness, sleep disturbance and agitation   Physical Exam: Blood pressure 156/59, pulse 65, temperature 98.2 F (36.8 C), temperature source Oral, resp. rate 20, height 5\' 6"  (1.676 m), weight 75.297 kg (166 lb), SpO2 100.00%. General: Alert, awake, oriented x3, no distress, pale. HEENT: Normocephalic, atraumatic, pupils equal and reactive to light, extraocular movements intact, moist mucous membranes. Neck: Supple, no JVD, no lymphadenopathy,  no bruits, no goiter. Cardiovascular: Regular rate and rhythm, no murmurs, rubs or gallops. Lungs: Clear to auscultation bilaterally. Abdomen: Soft, nontender, nondistended, positive bowel  sounds, no masses or organomegaly noted. Extremities: No clubbing, cyanosis or edema, positive pedal pulses. Neurologic: Grossly intact and nonfocal.  Labs on Admission:  Results for orders placed during the hospital encounter of 10/26/12 (from the past 48 hour(s))  OCCULT BLOOD, POC DEVICE     Status: None   Collection Time    10/26/12 10:02 AM      Result Value Range   Fecal Occult Bld NEGATIVE  NEGATIVE  GLUCOSE, CAPILLARY     Status: None   Collection Time    10/26/12 10:09 AM      Result Value Range   Glucose-Capillary 91  70 - 99 mg/dL  URINALYSIS, ROUTINE W REFLEX MICROSCOPIC     Status: None   Collection Time    10/26/12 10:10 AM      Result Value Range   Color, Urine YELLOW  YELLOW   APPearance CLEAR  CLEAR   Specific Gravity, Urine 1.015  1.005 - 1.030   pH 8.0  5.0 - 8.0   Glucose, UA NEGATIVE  NEGATIVE mg/dL   Hgb urine dipstick NEGATIVE  NEGATIVE   Bilirubin Urine NEGATIVE  NEGATIVE   Ketones, ur NEGATIVE  NEGATIVE mg/dL   Protein, ur NEGATIVE  NEGATIVE mg/dL   Urobilinogen, UA 0.2  0.0 - 1.0 mg/dL   Nitrite NEGATIVE  NEGATIVE   Leukocytes, UA NEGATIVE  NEGATIVE   Comment: MICROSCOPIC NOT DONE ON URINES WITH NEGATIVE PROTEIN, BLOOD, LEUKOCYTES, NITRITE, OR GLUCOSE <1000 mg/dL.  PROTIME-INR     Status: None   Collection Time    10/26/12 10:15 AM      Result Value Range   Prothrombin Time 14.3  11.6 - 15.2 seconds   INR 1.13  0.00 - 1.49  APTT     Status: None   Collection Time    10/26/12 10:15 AM      Result Value Range   aPTT 31  24 - 37 seconds  CBC     Status: Abnormal   Collection Time    10/26/12 10:15 AM      Result Value Range   WBC 4.8  4.0 - 10.5 K/uL   RBC 4.02  3.87 - 5.11 MIL/uL   Hemoglobin 8.2 (*) 12.0 - 15.0 g/dL   HCT 28.1 (*) 36.0 - 46.0 %   MCV 69.9 (*) 78.0 - 100.0 fL   MCH 20.4 (*) 26.0 - 34.0 pg   MCHC 29.2 (*) 30.0 - 36.0 g/dL   RDW 18.2 (*) 11.5 - 15.5 %   Platelets 333  150 - 400 K/uL  DIFFERENTIAL     Status: None    Collection Time    10/26/12 10:15 AM      Result Value Range   Neutrophils Relative % 71  43 - 77 %   Neutro Abs 3.4  1.7 - 7.7 K/uL   Lymphocytes Relative 18  12 - 46 %   Lymphs Abs 0.9  0.7 - 4.0 K/uL   Monocytes Relative 9  3 - 12 %   Monocytes Absolute 0.4  0.1 - 1.0 K/uL   Eosinophils Relative 2  0 - 5 %   Eosinophils Absolute 0.1  0.0 - 0.7 K/uL   Basophils Relative 1  0 - 1 %   Basophils Absolute 0.0  0.0 - 0.1 K/uL  COMPREHENSIVE METABOLIC PANEL  Status: Abnormal   Collection Time    10/26/12 10:15 AM      Result Value Range   Sodium 135  135 - 145 mEq/L   Potassium 4.4  3.5 - 5.1 mEq/L   Chloride 98  96 - 112 mEq/L   CO2 27  19 - 32 mEq/L   Glucose, Bld 94  70 - 99 mg/dL   BUN 13  6 - 23 mg/dL   Creatinine, Ser 0.80  0.50 - 1.10 mg/dL   Calcium 8.9  8.4 - 10.5 mg/dL   Total Protein 7.7  6.0 - 8.3 g/dL   Albumin 3.8  3.5 - 5.2 g/dL   AST 27  0 - 37 U/L   ALT 13  0 - 35 U/L   Alkaline Phosphatase 84  39 - 117 U/L   Total Bilirubin <0.1 (*) 0.3 - 1.2 mg/dL   GFR calc non Af Amer 69 (*) >90 mL/min   GFR calc Af Amer 80 (*) >90 mL/min   Comment: (NOTE)     The eGFR has been calculated using the CKD EPI equation.     This calculation has not been validated in all clinical situations.     eGFR's persistently <90 mL/min signify possible Chronic Kidney     Disease.  TROPONIN I     Status: None   Collection Time    10/26/12 10:15 AM      Result Value Range   Troponin I <0.30  <0.30 ng/mL   Comment:            Due to the release kinetics of cTnI,     a negative result within the first hours     of the onset of symptoms does not rule out     myocardial infarction with certainty.     If myocardial infarction is still suspected,     repeat the test at appropriate intervals.  TYPE AND SCREEN     Status: None   Collection Time    10/26/12 10:16 AM      Result Value Range   ABO/RH(D) A NEG     Antibody Screen NEG     Sample Expiration 10/29/2012    POCT I-STAT  TROPONIN I     Status: None   Collection Time    10/26/12 10:27 AM      Result Value Range   Troponin i, poc 0.01  0.00 - 0.08 ng/mL   Comment 3            Comment: Due to the release kinetics of cTnI,     a negative result within the first hours     of the onset of symptoms does not rule out     myocardial infarction with certainty.     If myocardial infarction is still suspected,     repeat the test at appropriate intervals.  RETICULOCYTES     Status: None   Collection Time    10/26/12 12:05 PM      Result Value Range   Retic Ct Pct 1.4  0.4 - 3.1 %   RBC. 4.04  3.87 - 5.11 MIL/uL   Retic Count, Manual 56.6  19.0 - 186.0 K/uL    Radiological Exams on Admission: Dg Chest 2 View  10/26/2012   CLINICAL DATA:  Weakness. Hypertension.  EXAM: CHEST  2 VIEW  COMPARISON:  Multiple priors.  FINDINGS: The cardiomediastinal silhouette is unchanged. No focal infiltrate or edema. No pleural effusion or pneumothorax. No  acute osseous abnormality. Hiatal hernia is noted.  IMPRESSION: No active cardiopulmonary disease.   Electronically Signed   By: Donavan Burnet M.D.   On: 10/26/2012 11:32    Assessment/Plan Active Problems:   Weakness generalized   Anemia   Diarrhea   Generalized weakness -Brittney Reynolds does admit to maybe being a bit weaker than usual, however both she and the caregiver confirm that she is still able to be minimally ambulatory as she has been before as well as perform Brittney wheelchair transfers. -She is certainly not focal, I do not believe she has had a stroke or requires any CVA workup. -Brittney anemia/diarrhea may have somewhat contributed to this, please see below for details. -I do not believe she currently requires hospitalization for these issues, and have advised that they followup with Dr. Luan Pulling next week.  Anemia -Hemoglobin is 8.2. -Prior known value was 12.5 in 2012. It is unclear how quickly of a decline this has been, and given Brittney fairly mild symptoms I  suspect this has happened over the long-term. -FOBT is negative. -New transfusion guidelines recommend PRBC transfusion only when hemoglobin is below 7 or to keep above 9 when there is history of coronary artery disease (which this Brittney Reynolds does not have). -As such, I would not recommend blood transfusion at this point. She will need close followup with Brittney PCP for monitoring of Brittney hemoglobin.  Diarrhea -Brittney Reynolds's caregiver states that she has been maintained on Colace and MiraLAX as she usually suffers from constipation. Over the course of the last 2 weeks she has had anywhere between 2 and 6 bowel movements a day. At one point they altogether stopped Brittney laxatives and the diarrhea resolved, however they restarted him for concerns of potential constipation. -I have recommended that they stop all laxatives at this point until further evaluated by Dr. Luan Pulling. -She has not been on any recent antibiotics and does not have any other risk factors that would make me think of an intestinal infection such as C. difficile.  Disposition -I believe Brittney Reynolds is safe to DC home today with close followup with Dr. Luan Pulling.  Time Spent on Consultation: 65 minutes  [facility]  [phone] 10/26/2012, 1:05 PM

## 2012-10-26 NOTE — ED Notes (Signed)
Pt is getting up to [facility] and getting dressed.  Caretaker is at the bedside

## 2012-10-27 LAB — FOLATE: Folate: >20 ng/mL

## 2012-10-27 LAB — FERRITIN: Ferritin: 6 ng/mL — ABNORMAL LOW (ref 10–291)

## 2012-11-06 DIAGNOSIS — D649 Anemia, unspecified: Secondary | ICD-10-CM | POA: Diagnosis not present

## 2013-02-11 DIAGNOSIS — R05 Cough: Secondary | ICD-10-CM | POA: Diagnosis not present

## 2013-02-11 DIAGNOSIS — R569 Unspecified convulsions: Secondary | ICD-10-CM | POA: Diagnosis not present

## 2013-02-11 DIAGNOSIS — F329 Major depressive disorder, single episode, unspecified: Secondary | ICD-10-CM | POA: Diagnosis not present

## 2013-02-11 DIAGNOSIS — R059 Cough, unspecified: Secondary | ICD-10-CM | POA: Diagnosis not present

## 2013-02-11 DIAGNOSIS — I10 Essential (primary) hypertension: Secondary | ICD-10-CM | POA: Diagnosis not present

## 2013-02-11 DIAGNOSIS — F3289 Other specified depressive episodes: Secondary | ICD-10-CM | POA: Diagnosis not present

## 2013-02-12 DIAGNOSIS — F3289 Other specified depressive episodes: Secondary | ICD-10-CM | POA: Diagnosis not present

## 2013-02-12 DIAGNOSIS — F329 Major depressive disorder, single episode, unspecified: Secondary | ICD-10-CM | POA: Diagnosis not present

## 2013-02-12 DIAGNOSIS — R569 Unspecified convulsions: Secondary | ICD-10-CM | POA: Diagnosis not present

## 2013-02-12 DIAGNOSIS — R5381 Other malaise: Secondary | ICD-10-CM | POA: Diagnosis not present

## 2013-02-12 DIAGNOSIS — I2510 Atherosclerotic heart disease of native coronary artery without angina pectoris: Secondary | ICD-10-CM | POA: Diagnosis not present

## 2013-02-12 DIAGNOSIS — F411 Generalized anxiety disorder: Secondary | ICD-10-CM | POA: Diagnosis not present

## 2013-02-12 DIAGNOSIS — R5383 Other fatigue: Secondary | ICD-10-CM | POA: Diagnosis not present

## 2013-02-12 DIAGNOSIS — I10 Essential (primary) hypertension: Secondary | ICD-10-CM | POA: Diagnosis not present

## 2013-05-25 ENCOUNTER — Encounter (HOSPITAL_COMMUNITY): Payer: Self-pay | Admitting: Emergency Medicine

## 2013-05-25 ENCOUNTER — Emergency Department (HOSPITAL_COMMUNITY)
Admission: EM | Admit: 2013-05-25 | Discharge: 2013-05-25 | Disposition: A | Discharge status: Home / Self Care | Payer: Medicare Other | Attending: Emergency Medicine | Admitting: Emergency Medicine

## 2013-05-25 ENCOUNTER — Emergency Department (HOSPITAL_COMMUNITY): Payer: Medicare Other

## 2013-05-25 DIAGNOSIS — G40909 Epilepsy, unspecified, not intractable, without status epilepticus: Secondary | ICD-10-CM | POA: Insufficient documentation

## 2013-05-25 DIAGNOSIS — F411 Generalized anxiety disorder: Secondary | ICD-10-CM | POA: Diagnosis not present

## 2013-05-25 DIAGNOSIS — Y9389 Activity, other specified: Secondary | ICD-10-CM | POA: Insufficient documentation

## 2013-05-25 DIAGNOSIS — S6980XA Other specified injuries of unspecified wrist, hand and finger(s), initial encounter: Secondary | ICD-10-CM | POA: Diagnosis not present

## 2013-05-25 DIAGNOSIS — Z862 Personal history of diseases of the blood and blood-forming organs and certain disorders involving the immune mechanism: Secondary | ICD-10-CM | POA: Diagnosis not present

## 2013-05-25 DIAGNOSIS — F3289 Other specified depressive episodes: Secondary | ICD-10-CM | POA: Insufficient documentation

## 2013-05-25 DIAGNOSIS — F329 Major depressive disorder, single episode, unspecified: Secondary | ICD-10-CM | POA: Diagnosis not present

## 2013-05-25 DIAGNOSIS — S8990XA Unspecified injury of unspecified lower leg, initial encounter: Secondary | ICD-10-CM | POA: Insufficient documentation

## 2013-05-25 DIAGNOSIS — S99919A Unspecified injury of unspecified ankle, initial encounter: Secondary | ICD-10-CM | POA: Diagnosis not present

## 2013-05-25 DIAGNOSIS — S6990XA Unspecified injury of unspecified wrist, hand and finger(s), initial encounter: Secondary | ICD-10-CM | POA: Diagnosis not present

## 2013-05-25 DIAGNOSIS — M25579 Pain in unspecified ankle and joints of unspecified foot: Secondary | ICD-10-CM | POA: Diagnosis not present

## 2013-05-25 DIAGNOSIS — Z79899 Other long term (current) drug therapy: Secondary | ICD-10-CM | POA: Diagnosis not present

## 2013-05-25 DIAGNOSIS — Z9889 Other specified postprocedural states: Secondary | ICD-10-CM | POA: Insufficient documentation

## 2013-05-25 DIAGNOSIS — I10 Essential (primary) hypertension: Secondary | ICD-10-CM | POA: Diagnosis not present

## 2013-05-25 DIAGNOSIS — Z860100 Personal history of colon polyps, unspecified: Secondary | ICD-10-CM | POA: Insufficient documentation

## 2013-05-25 DIAGNOSIS — S99929A Unspecified injury of unspecified foot, initial encounter: Principal | ICD-10-CM

## 2013-05-25 DIAGNOSIS — Y929 Unspecified place or not applicable: Secondary | ICD-10-CM | POA: Insufficient documentation

## 2013-05-25 DIAGNOSIS — R109 Unspecified abdominal pain: Secondary | ICD-10-CM | POA: Diagnosis not present

## 2013-05-25 DIAGNOSIS — R296 Repeated falls: Secondary | ICD-10-CM | POA: Insufficient documentation

## 2013-05-25 DIAGNOSIS — M79609 Pain in unspecified limb: Secondary | ICD-10-CM | POA: Diagnosis not present

## 2013-05-25 NOTE — Discharge Instructions (Signed)
Use Tylenol, if needed, for pain.

## 2013-05-25 NOTE — ED Notes (Signed)
Pt fell last night in bathroom. States she lost her balance. Pt always ambulates with a walker. Pain to left ankle, left knee, and right 5th finger.

## 2013-05-25 NOTE — ED Provider Notes (Signed)
CSN: [id number]     Arrival date & time 05/25/13  [id number] History  This chart was scribed for Richarda Blade, MD by Einar Pheasant, ED Scribe. This patient was seen in room APA08/APA08 and the patient's care was started at 10:01 AM.     Chief Complaint  Patient presents with  . Fall    The history is provided by the patient and a caregiver. No language interpreter was used.   HPI Comments: Brittney Reynolds is a 80 y.o. female who presents to the Emergency Department complaining of a fall that occurred yesterday. Pt states that she fell off the commode. The caregiver reports that she had 2 other falls while using the bathroom that occurred approximately 2 days ago while being taken care of by another Caregiver. She states that yesterday pt started complaining of left knee pain and left ankle pain. She applied cold compresses to the left knee. Caregiver states that pt is wheelchair bound. She reports that the pt has been eating normally. Pt currently lives with her sister and she has caregivers 24/7. Pt denies being in any pain at the moment.   Pt has a plate in her right mid ankle.   Past Medical History  Diagnosis Date  . Polio     As a child  . Anxiety   . Depression   . HTN (hypertension)   . Seizures   . Colon polyp 5 yrs ago    in  [place]  . Anemia    Past Surgical History  Procedure Laterality Date  . Otif  2009    Right ankle  . Foot surgery  11/11    Left foot   No family history on file. History  Substance Use Topics  . Smoking status: Never Smoker   . Smokeless tobacco: Not on file  . Alcohol Use: No   OB History   Grav Para Term Preterm Abortions TAB SAB Ect Mult Living                 Review of Systems  Musculoskeletal: Positive for arthralgias (left knee) and joint swelling (left ankle).  All other systems reviewed and are negative.     Allergies  Acetaminophen  Home Medications   Prior to Admission medications   Medication Sig Start Date  End Date Taking? Authorizing Provider  amLODipine (NORVASC) 10 MG tablet Take 10 mg by mouth daily. 10/16/12   Historical Provider, MD  Calcium Carbonate-Vitamin D (CALCIUM + D PO) Take 600 mg by mouth.      Historical Provider, MD  diltiazem (TIAZAC) 360 MG 24 hr capsule Take 360 mg by mouth daily. 10/23/12   Historical Provider, MD  escitalopram (LEXAPRO) 10 MG tablet Take 10 mg by mouth daily.      Historical Provider, MD  metoprolol succinate (TOPROL-XL) 25 MG 24 hr tablet Take 25 mg by mouth daily.      Historical Provider, MD  oxybutynin (DITROPAN-XL) 5 MG 24 hr tablet Take 5 mg by mouth daily. 10/16/12   Historical Provider, MD  pantoprazole (PROTONIX) 40 MG tablet Take 40 mg by mouth daily.    Historical Provider, MD  phenytoin (DILANTIN) 100 MG ER capsule Take 100 mg by mouth 2 (two) times daily.     Historical Provider, MD  primidone (MYSOLINE) 50 MG tablet Take 200 mg by mouth 3 (three) times daily.    Historical Provider, MD  raloxifene (EVISTA) 60 MG tablet Take 60 mg by mouth daily.  Historical Provider, MD   BP 145/68  Pulse 77  Temp(Src) 97.3 F (36.3 C) (Oral)  Resp 16  Ht 5\' 6"  (1.676 m)  Wt 170 lb (77.111 kg)  BMI 27.45 kg/m2  SpO2 98%  Physical Exam  Nursing note and vitals reviewed. Constitutional: She is oriented to person, place, and time. She appears well-developed and well-nourished.  HENT:  Head: Normocephalic and atraumatic.  Eyes: Conjunctivae and EOM are normal. Pupils are equal, round, and reactive to light.  Neck: Normal range of motion and phonation normal. Neck supple.  Cardiovascular: Normal rate, regular rhythm and intact distal pulses.   Pulmonary/Chest: Effort normal and breath sounds normal. She exhibits no tenderness.  Abdominal: Soft. She exhibits no distension. There is no tenderness. There is no guarding.  Mild mid abdominal tenderness.   Musculoskeletal: Normal range of motion.  Mild swelling to right 5th PIP. 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   Left Leg: tenderness to left knee and resists flexion secondary to pain. No swelling or deformity. Lateral Tender and swollen left ankle. Left foot is swollen and tender with ecchymosis noted.   Neurological: She is alert and oriented to person, place, and time. She exhibits normal muscle tone.  Skin: Skin is warm and dry.  Psychiatric: She has a normal mood and affect. Her behavior is normal. Judgment and thought content normal.    ED Course  Procedures (including critical care time)  DIAGNOSTIC STUDIES: 10:10 AM- Pt advised of plan for treatment and pt agrees. 11:57 AM-informed pt of her X-ray results. Pt will be discharged.   Oxygen Saturation is 98% on RA, normal by my interpretation.    COORDINATION OF CARE: Medications - No data to display  Patient Vitals for the past 24 hrs:  BP Temp Temp src Pulse Resp SpO2 Height Weight  05/25/13 1200 128/62 mmHg 98.1 F (36.7 C) Oral 64 18 98 % - -  05/25/13 0959 145/68 mmHg 97.3 F (36.3 C) Oral 77 16 98 % 5\' 6"  (1.676 m) 170 lb (77.111 kg)    Imaging Review Dg Ankle Complete Left  05/25/2013   CLINICAL DATA:  pain  EXAM: LEFT ANKLE COMPLETE - 3+ VIEW  COMPARISON:  DG FOOT COMPLETE*L* dated 05/25/2013; DG ANKLE COMPLETE*L* dated 12/16/2009  FINDINGS: Patient is status post open reduction internal fixation distal tibial fracture. Hardware is intact. There is no interval healing of the distal fibular fracture. No acute osseous abnormalities.  IMPRESSION: No acute osseous abnormalities. Hardware is intact. Healed distal tibial and fibular fractures.   Electronically Signed   By: Margaree Mackintosh M.D.   On: 05/25/2013 10:51   Dg Finger Little Right  05/25/2013   CLINICAL DATA:  FALL  EXAM: RIGHT LITTLE FINGER 2+V  COMPARISON:  None.  FINDINGS: There is no evidence of fracture or dislocation. Osteoarthritic changes within the soft tissues unremarkable.  IMPRESSION: No acute osseous abnormalities.  Osteoarthritic changes.    Electronically Signed   By: Margaree Mackintosh M.D.   On: 05/25/2013 11:09   Dg Foot Complete Left  05/25/2013   CLINICAL DATA:  pain status post trauma  EXAM: LEFT FOOT - COMPLETE 3+ VIEW  COMPARISON:  None.  FINDINGS: There is no evidence of fracture or dislocation. Osteoarthritic changes within the proximal and distal interphalangeal joints. Soft tissues are unremarkable. The bones are diffusely osteopenic. The visualized distal tibial hardware demonstrates no gross abnormalities.  IMPRESSION: No acute osseous abnormalities.   Electronically Signed   By: Margaree Mackintosh  M.D.   On: 05/25/2013 10:49    MDM   Final diagnoses:  Leg injury   Contusion from subacute fall, no significant injury.  Nursing Notes Reviewed/ Care Coordinated Applicable Imaging Reviewed Interpretation of Laboratory Data incorporated into ED treatment  The patient appears reasonably screened and/or stabilized for discharge and I doubt any other medical condition or other [facility] requiring further screening, evaluation, or treatment in the ED at this time prior to discharge.  Plan: Home Medications- usual; Home Treatments- rest; return here if the recommended treatment, does not improve the symptoms; Recommended follow up- PCP prn  I personally performed the services described in this documentation, which was scribed in my presence. The recorded information has been reviewed and is accurate.     Richarda Blade, MD 05/25/13 [phone]

## 2013-05-30 DIAGNOSIS — H43819 Vitreous degeneration, unspecified eye: Secondary | ICD-10-CM | POA: Diagnosis not present

## 2013-05-30 DIAGNOSIS — H52229 Regular astigmatism, unspecified eye: Secondary | ICD-10-CM | POA: Diagnosis not present

## 2013-05-30 DIAGNOSIS — H5210 Myopia, unspecified eye: Secondary | ICD-10-CM | POA: Diagnosis not present

## 2013-05-30 DIAGNOSIS — H524 Presbyopia: Secondary | ICD-10-CM | POA: Diagnosis not present

## 2013-06-11 IMAGING — CR DG HAND COMPLETE 3+V*R*
3 series · 3 of 3 positions shown · non-contrast
Comparison: None

CLINICAL DATA: Right hand pain

RIGHT HAND - COMPLETE 3+ VIEW

[view not recorded (1 of 3)]
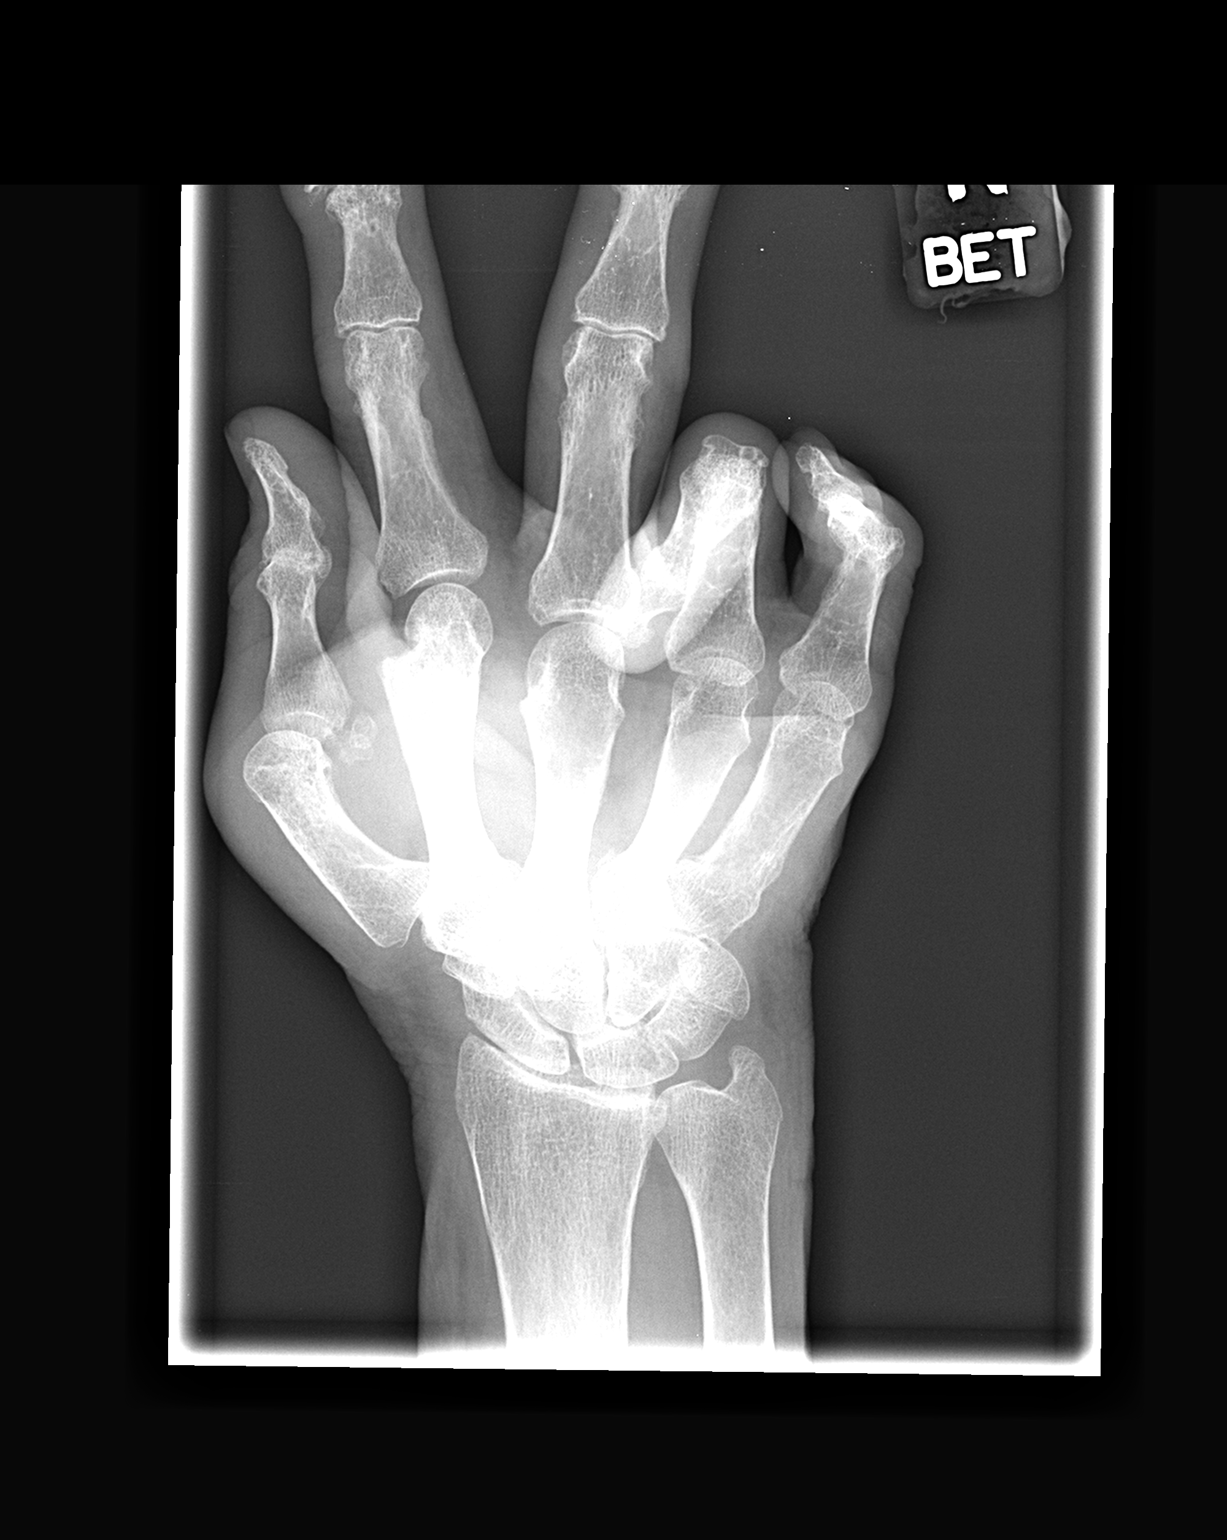

[view not recorded (2 of 3)]
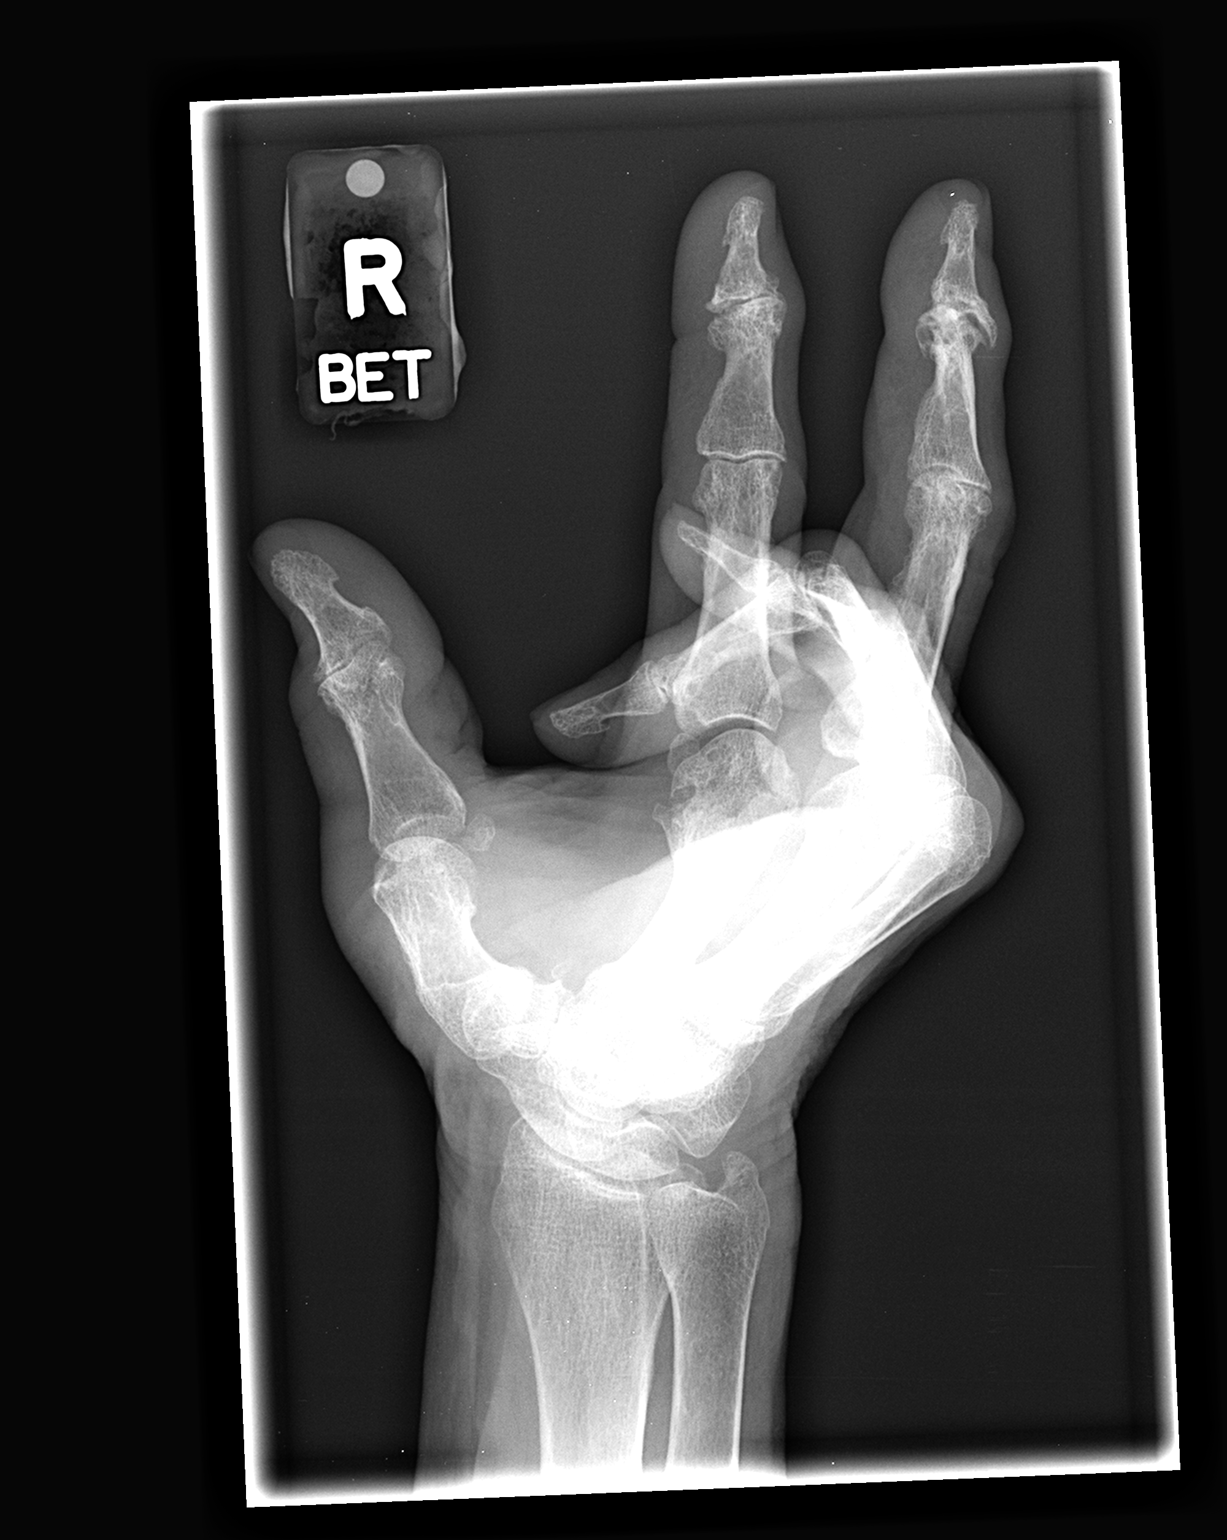

[view not recorded (3 of 3)]
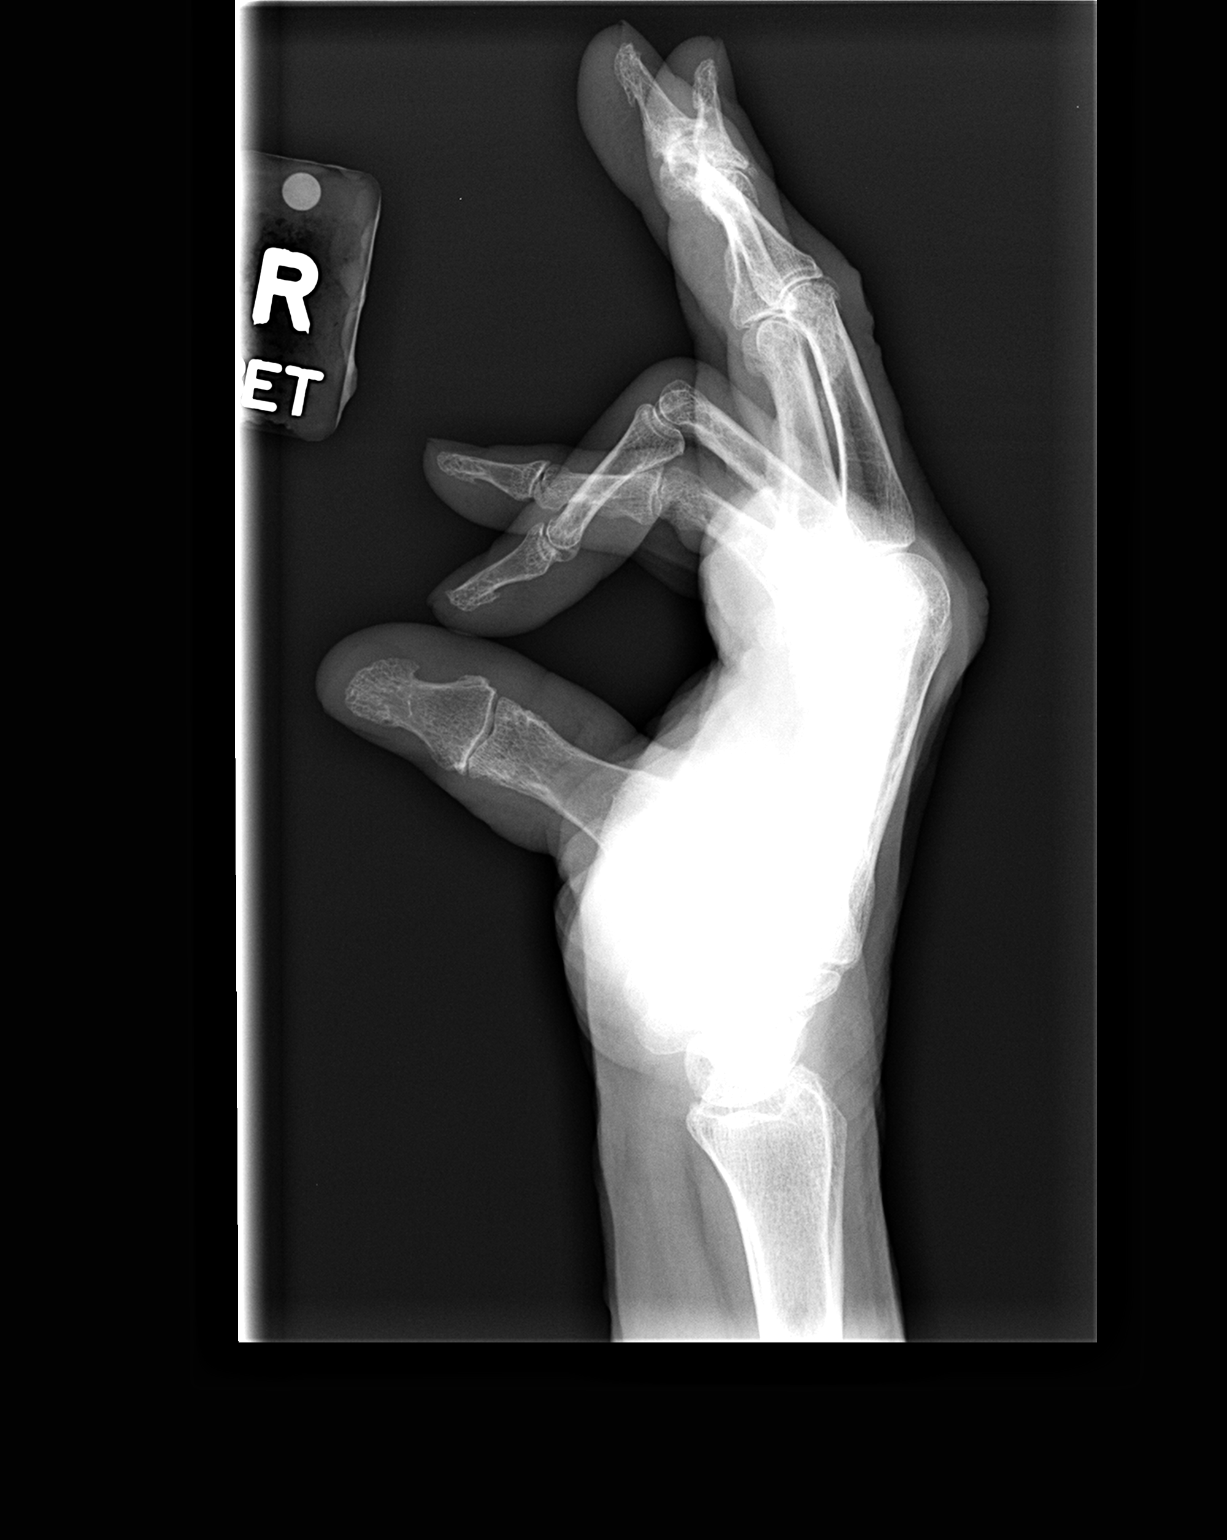

[3 of 3 positions shown; findings below may reference images not displayed]

FINDINGS: The fourth and fifth fingers are flexed at the PIP
joints.

There is moderate to marked cystic degenerative changes involving
the third DIP joint.  Moderate degenerative changes involve the
second DIP joint.

No fractures or subluxations identified.

There is no radio-opaque foreign body or soft tissue calcification.
IMPRESSION: 1.  Degenerative changes as above.
2.  No acute fractures or subluxations.

## 2013-08-15 DIAGNOSIS — G40909 Epilepsy, unspecified, not intractable, without status epilepticus: Secondary | ICD-10-CM | POA: Diagnosis not present

## 2013-08-15 DIAGNOSIS — E039 Hypothyroidism, unspecified: Secondary | ICD-10-CM | POA: Diagnosis not present

## 2013-09-12 DIAGNOSIS — I10 Essential (primary) hypertension: Secondary | ICD-10-CM | POA: Diagnosis not present

## 2013-09-12 DIAGNOSIS — M1990 Unspecified osteoarthritis, unspecified site: Secondary | ICD-10-CM | POA: Diagnosis not present

## 2013-09-12 DIAGNOSIS — R569 Unspecified convulsions: Secondary | ICD-10-CM | POA: Diagnosis not present

## 2013-09-12 DIAGNOSIS — K2100 Gastro-esophageal reflux disease with esophagitis, without bleeding: Secondary | ICD-10-CM | POA: Diagnosis not present

## 2013-10-18 DIAGNOSIS — N390 Urinary tract infection, site not specified: Secondary | ICD-10-CM | POA: Diagnosis not present

## 2013-10-18 DIAGNOSIS — Z23 Encounter for immunization: Secondary | ICD-10-CM | POA: Diagnosis not present

## 2013-12-17 DIAGNOSIS — M7590 Shoulder lesion, unspecified, unspecified shoulder: Secondary | ICD-10-CM | POA: Diagnosis not present

## 2013-12-17 DIAGNOSIS — R569 Unspecified convulsions: Secondary | ICD-10-CM | POA: Diagnosis not present

## 2013-12-17 DIAGNOSIS — M159 Polyosteoarthritis, unspecified: Secondary | ICD-10-CM | POA: Diagnosis not present

## 2013-12-17 DIAGNOSIS — L918 Other hypertrophic disorders of the skin: Secondary | ICD-10-CM | POA: Diagnosis not present

## 2013-12-17 DIAGNOSIS — G40909 Epilepsy, unspecified, not intractable, without status epilepticus: Secondary | ICD-10-CM | POA: Diagnosis not present

## 2013-12-17 DIAGNOSIS — I10 Essential (primary) hypertension: Secondary | ICD-10-CM | POA: Diagnosis not present

## 2014-04-01 DIAGNOSIS — I10 Essential (primary) hypertension: Secondary | ICD-10-CM | POA: Diagnosis not present

## 2014-04-01 DIAGNOSIS — R413 Other amnesia: Secondary | ICD-10-CM | POA: Diagnosis not present

## 2014-04-01 DIAGNOSIS — G40909 Epilepsy, unspecified, not intractable, without status epilepticus: Secondary | ICD-10-CM | POA: Diagnosis not present

## 2014-04-01 DIAGNOSIS — K210 Gastro-esophageal reflux disease with esophagitis: Secondary | ICD-10-CM | POA: Diagnosis not present

## 2014-04-21 DIAGNOSIS — R300 Dysuria: Secondary | ICD-10-CM | POA: Diagnosis not present

## 2014-07-25 DIAGNOSIS — G40909 Epilepsy, unspecified, not intractable, without status epilepticus: Secondary | ICD-10-CM | POA: Diagnosis not present

## 2014-07-25 DIAGNOSIS — I10 Essential (primary) hypertension: Secondary | ICD-10-CM | POA: Diagnosis not present

## 2014-07-25 DIAGNOSIS — N390 Urinary tract infection, site not specified: Secondary | ICD-10-CM | POA: Diagnosis not present

## 2014-11-12 ENCOUNTER — Inpatient Hospital Stay (HOSPITAL_COMMUNITY)
Admission: EM | Admit: 2014-11-12 | Discharge: 2014-11-14 | DRG: 689 | Disposition: A | Discharge status: Home Health | Payer: Medicare Other | Attending: Pulmonary Disease | Admitting: Pulmonary Disease

## 2014-11-12 ENCOUNTER — Encounter (HOSPITAL_COMMUNITY): Payer: Self-pay | Admitting: Emergency Medicine

## 2014-11-12 ENCOUNTER — Emergency Department (HOSPITAL_COMMUNITY): Payer: Medicare Other

## 2014-11-12 DIAGNOSIS — G9341 Metabolic encephalopathy: Secondary | ICD-10-CM | POA: Diagnosis present

## 2014-11-12 DIAGNOSIS — G9389 Other specified disorders of brain: Secondary | ICD-10-CM

## 2014-11-12 DIAGNOSIS — R479 Unspecified speech disturbances: Secondary | ICD-10-CM

## 2014-11-12 DIAGNOSIS — G40909 Epilepsy, unspecified, not intractable, without status epilepticus: Secondary | ICD-10-CM | POA: Diagnosis not present

## 2014-11-12 DIAGNOSIS — N390 Urinary tract infection, site not specified: Principal | ICD-10-CM | POA: Diagnosis present

## 2014-11-12 DIAGNOSIS — R4701 Aphasia: Secondary | ICD-10-CM | POA: Diagnosis not present

## 2014-11-12 DIAGNOSIS — I10 Essential (primary) hypertension: Secondary | ICD-10-CM | POA: Diagnosis not present

## 2014-11-12 DIAGNOSIS — R471 Dysarthria and anarthria: Secondary | ICD-10-CM | POA: Diagnosis not present

## 2014-11-12 DIAGNOSIS — M1990 Unspecified osteoarthritis, unspecified site: Secondary | ICD-10-CM | POA: Diagnosis present

## 2014-11-12 DIAGNOSIS — G14 Postpolio syndrome: Secondary | ICD-10-CM | POA: Diagnosis present

## 2014-11-12 DIAGNOSIS — R410 Disorientation, unspecified: Secondary | ICD-10-CM | POA: Diagnosis not present

## 2014-11-12 DIAGNOSIS — M545 Low back pain: Secondary | ICD-10-CM | POA: Diagnosis not present

## 2014-11-12 DIAGNOSIS — F0150 Vascular dementia without behavioral disturbance: Secondary | ICD-10-CM | POA: Diagnosis not present

## 2014-11-12 DIAGNOSIS — M79605 Pain in left leg: Secondary | ICD-10-CM | POA: Diagnosis not present

## 2014-11-12 DIAGNOSIS — M79604 Pain in right leg: Secondary | ICD-10-CM | POA: Diagnosis not present

## 2014-11-12 DIAGNOSIS — F4489 Other dissociative and conversion disorders: Secondary | ICD-10-CM | POA: Diagnosis not present

## 2014-11-12 DIAGNOSIS — R4182 Altered mental status, unspecified: Secondary | ICD-10-CM | POA: Diagnosis not present

## 2014-11-12 DIAGNOSIS — R569 Unspecified convulsions: Secondary | ICD-10-CM | POA: Diagnosis not present

## 2014-11-12 DIAGNOSIS — R05 Cough: Secondary | ICD-10-CM | POA: Diagnosis not present

## 2014-11-12 DIAGNOSIS — I6350 Cerebral infarction due to unspecified occlusion or stenosis of unspecified cerebral artery: Secondary | ICD-10-CM | POA: Diagnosis not present

## 2014-11-12 LAB — URINALYSIS, ROUTINE W REFLEX MICROSCOPIC
Bilirubin Urine: NEGATIVE
GLUCOSE, UA: NEGATIVE mg/dL
Hgb urine dipstick: NEGATIVE
Ketones, ur: NEGATIVE mg/dL
Nitrite: POSITIVE — AB
PROTEIN: NEGATIVE mg/dL
Specific Gravity, Urine: 1.015 (ref 1.005–1.030)
UROBILINOGEN UA: 0.2 mg/dL (ref 0.0–1.0)
pH: 6.5 (ref 5.0–8.0)

## 2014-11-12 LAB — COMPREHENSIVE METABOLIC PANEL
ALK PHOS: 60 U/L (ref 38–126)
ALT: 15 U/L (ref 14–54)
ANION GAP: 9 (ref 5–15)
AST: 24 U/L (ref 15–41)
Albumin: 4.2 g/dL (ref 3.5–5.0)
BUN: 24 mg/dL — ABNORMAL HIGH (ref 6–20)
CALCIUM: 9.6 mg/dL (ref 8.9–10.3)
CO2: 31 mmol/L (ref 22–32)
Chloride: 97 mmol/L — ABNORMAL LOW (ref 101–111)
Creatinine, Ser: 1.01 mg/dL — ABNORMAL HIGH (ref 0.44–1.00)
GFR calc non Af Amer: 52 mL/min — ABNORMAL LOW (ref >60)
GFR, EST AFRICAN AMERICAN: 60 mL/min — AB (ref >60)
Glucose, Bld: 81 mg/dL (ref 65–99)
Potassium: 3.9 mmol/L (ref 3.5–5.1)
SODIUM: 137 mmol/L (ref 135–145)
Total Bilirubin: 0.4 mg/dL (ref 0.3–1.2)
Total Protein: 7.6 g/dL (ref 6.5–8.1)

## 2014-11-12 LAB — URINE MICROSCOPIC-ADD ON

## 2014-11-12 LAB — CBC
HCT: 37.8 % (ref 36.0–46.0)
HEMOGLOBIN: 12.8 g/dL (ref 12.0–15.0)
MCH: 32.1 pg (ref 26.0–34.0)
MCHC: 33.9 g/dL (ref 30.0–36.0)
MCV: 94.7 fL (ref 78.0–100.0)
Platelets: 192 10*3/uL (ref 150–400)
RBC: 3.99 MIL/uL (ref 3.87–5.11)
RDW: 12.7 % (ref 11.5–15.5)
WBC: 5.5 10*3/uL (ref 4.0–10.5)

## 2014-11-12 LAB — PHENYTOIN LEVEL, TOTAL: PHENYTOIN LVL: 8.8 ug/mL — AB (ref 10.0–20.0)

## 2014-11-12 MED ORDER — METOPROLOL SUCCINATE ER 25 MG PO TB24
25.0000 mg | ORAL_TABLET | Freq: Every evening | ORAL | Status: DC
  Administered 2014-11-12 – 2014-11-13 (×2): 25 mg via ORAL
  Filled 2014-11-12 (×4): qty 1

## 2014-11-12 MED ORDER — PHENYTOIN 125 MG/5ML PO SUSP
125.0000 mg | Freq: Every morning | ORAL | Status: DC
  Administered 2014-11-13 – 2014-11-14 (×2): 125 mg via ORAL
  Filled 2014-11-12 (×5): qty 5

## 2014-11-12 MED ORDER — DEXTROSE 5 % IV SOLN
1.0000 g | Freq: Once | INTRAVENOUS | Status: AC
  Administered 2014-11-12: 1 g via INTRAVENOUS
  Filled 2014-11-12: qty 10

## 2014-11-12 MED ORDER — LEVOTHYROXINE SODIUM 100 MCG PO TABS
100.0000 ug | ORAL_TABLET | Freq: Every day | ORAL | Status: DC
  Administered 2014-11-13 – 2014-11-14 (×2): 100 ug via ORAL
  Filled 2014-11-12 (×3): qty 1

## 2014-11-12 MED ORDER — SODIUM CHLORIDE 0.9 % IV SOLN
INTRAVENOUS | Status: DC
  Administered 2014-11-12 – 2014-11-14 (×2): via INTRAVENOUS

## 2014-11-12 MED ORDER — DILTIAZEM HCL ER BEADS 240 MG PO CP24
360.0000 mg | ORAL_CAPSULE | Freq: Every day | ORAL | Status: DC
  Administered 2014-11-13 – 2014-11-14 (×2): 360 mg via ORAL
  Filled 2014-11-12 (×11): qty 1

## 2014-11-12 MED ORDER — POLYETHYLENE GLYCOL 3350 17 GM/SCOOP PO POWD
17.0000 g | Freq: Every day | ORAL | Status: DC
  Filled 2014-11-12: qty 255

## 2014-11-12 MED ORDER — DEXTROSE 5 % IV SOLN
1.0000 g | INTRAVENOUS | Status: DC
  Administered 2014-11-13: 1 g via INTRAVENOUS
  Filled 2014-11-12 (×4): qty 10

## 2014-11-12 MED ORDER — HYDROCHLOROTHIAZIDE 12.5 MG PO CAPS
12.5000 mg | ORAL_CAPSULE | Freq: Every day | ORAL | Status: DC
  Administered 2014-11-12 – 2014-11-13 (×2): 12.5 mg via ORAL
  Filled 2014-11-12 (×5): qty 1

## 2014-11-12 MED ORDER — LOSARTAN POTASSIUM-HCTZ 100-12.5 MG PO TABS: 1.0000 | ORAL_TABLET | Freq: Every day | ORAL | Status: DC

## 2014-11-12 MED ORDER — PRIMIDONE 50 MG PO TABS
ORAL_TABLET | ORAL | Status: AC
  Filled 2014-11-12: qty 4

## 2014-11-12 MED ORDER — DEXTROSE 5 % IV SOLN: 1.0000 g | INTRAVENOUS | Status: DC

## 2014-11-12 MED ORDER — ESCITALOPRAM OXALATE 10 MG PO TABS
10.0000 mg | ORAL_TABLET | Freq: Every day | ORAL | Status: DC
  Administered 2014-11-13 – 2014-11-14 (×2): 10 mg via ORAL
  Filled 2014-11-12 (×4): qty 1

## 2014-11-12 MED ORDER — PRIMIDONE 50 MG PO TABS
200.0000 mg | ORAL_TABLET | Freq: Three times a day (TID) | ORAL | Status: DC
  Administered 2014-11-12 – 2014-11-13 (×4): 200 mg via ORAL
  Filled 2014-11-12 (×14): qty 4

## 2014-11-12 MED ORDER — STROKE: EARLY STAGES OF RECOVERY BOOK
Freq: Once | Status: DC
  Filled 2014-11-12: qty 1

## 2014-11-12 MED ORDER — OXYBUTYNIN CHLORIDE ER 5 MG PO TB24
5.0000 mg | ORAL_TABLET | Freq: Every day | ORAL | Status: DC
  Administered 2014-11-13: 5 mg via ORAL
  Filled 2014-11-12 (×3): qty 1

## 2014-11-12 MED ORDER — FERROUS SULFATE 325 (65 FE) MG PO TABS
325.0000 mg | ORAL_TABLET | Freq: Two times a day (BID) | ORAL | Status: DC
  Administered 2014-11-13 – 2014-11-14 (×3): 325 mg via ORAL
  Filled 2014-11-12 (×6): qty 1

## 2014-11-12 MED ORDER — RALOXIFENE HCL 60 MG PO TABS
60.0000 mg | ORAL_TABLET | Freq: Every day | ORAL | Status: DC
  Administered 2014-11-13: 60 mg via ORAL
  Filled 2014-11-12 (×3): qty 1

## 2014-11-12 MED ORDER — LOSARTAN POTASSIUM 50 MG PO TABS
100.0000 mg | ORAL_TABLET | Freq: Every day | ORAL | Status: DC
  Administered 2014-11-12 – 2014-11-14 (×3): 100 mg via ORAL
  Filled 2014-11-12 (×6): qty 2

## 2014-11-12 MED ORDER — DONEPEZIL HCL 5 MG PO TABS
5.0000 mg | ORAL_TABLET | Freq: Every day | ORAL | Status: DC
  Administered 2014-11-12 – 2014-11-13 (×2): 5 mg via ORAL
  Filled 2014-11-12 (×4): qty 1

## 2014-11-12 MED ORDER — ENOXAPARIN SODIUM 40 MG/0.4ML ~~LOC~~ SOLN
40.0000 mg | SUBCUTANEOUS | Status: DC
  Administered 2014-11-12 – 2014-11-13 (×2): 40 mg via SUBCUTANEOUS
  Filled 2014-11-12 (×2): qty 0.4

## 2014-11-12 MED ORDER — PANTOPRAZOLE SODIUM 40 MG PO TBEC
40.0000 mg | DELAYED_RELEASE_TABLET | Freq: Every day | ORAL | Status: DC
  Administered 2014-11-12 – 2014-11-14 (×3): 40 mg via ORAL
  Filled 2014-11-12 (×3): qty 1

## 2014-11-12 NOTE — ED Provider Notes (Signed)
CSN: [id number]     Arrival date & time 11/12/14  1804 History   First MD Initiated Contact with Patient 11/12/14 1853     Chief Complaint  Patient presents with  . Leg Pain     (Consider location/radiation/quality/duration/timing/severity/associated sxs/prior Treatment) Patient is a 81 y.o. female presenting with leg pain. The history is provided by the patient.  Leg Pain Associated symptoms: back pain   Associated symptoms: no fever    patient presents with some confusion. Reportedly has had slurred speech since yesterday. Patient's niece is with her now. Patient's niece states that the patient's caregiver stated the speech is slurred. Patient is reportedly been confused also. Has had some pain in her upper back. Radiates down her back into her legs. No chest pain. No abdominal pain. No numbness or weakness. Speech is just slurred. This is reportedly not her baseline. No headache. No vision changes.  Past Medical History  Diagnosis Date  . Polio     As a child  . Anxiety   . Depression   . HTN (hypertension)   . Seizures ([place])   . Colon polyp 5 yrs ago    in  [place]  . Anemia    Past Surgical History  Procedure Laterality Date  . Otif  2009    Right ankle  . Foot surgery  11/11    Left foot   History reviewed. No pertinent family history. Social History  Substance Use Topics  . Smoking status: Never Smoker   . Smokeless tobacco: None  . Alcohol Use: No   OB History    No data available     Review of Systems  Constitutional: Negative for fever and appetite change.  HENT: Negative for facial swelling and trouble swallowing.   Eyes: Negative for visual disturbance.  Respiratory: Negative for chest tightness and shortness of breath.   Cardiovascular: Negative for chest pain.  Gastrointestinal: Negative for abdominal pain.  Musculoskeletal: Positive for back pain.  Skin: Negative for pallor.  Neurological: Positive for speech difficulty.   Psychiatric/Behavioral: Positive for confusion.      Allergies  Acetaminophen  Home Medications   Prior to Admission medications   Medication Sig Start Date End Date Taking? Authorizing Provider  Calcium Carbonate-Vitamin D (CALCIUM + D PO) Take 600 mg by mouth 2 (two) times daily.    Yes Historical Provider, MD  diltiazem (TIAZAC) 360 MG 24 hr capsule Take 360 mg by mouth daily. 10/23/12  Yes Historical Provider, MD  donepezil (ARICEPT) 5 MG tablet Take 5 mg by mouth at bedtime.   Yes Historical Provider, MD  escitalopram (LEXAPRO) 10 MG tablet Take 10 mg by mouth daily.     Yes Historical Provider, MD  ferrous sulfate 325 (65 FE) MG tablet Take 325 mg by mouth 2 (two) times daily with a meal.   Yes Historical Provider, MD  levothyroxine (SYNTHROID, LEVOTHROID) 100 MCG tablet Take 100 mcg by mouth daily before breakfast.   Yes Historical Provider, MD  losartan-hydrochlorothiazide (HYZAAR) 100-12.5 MG per tablet Take 1 tablet by mouth daily. 05/09/13  Yes Historical Provider, MD  metoprolol succinate (TOPROL-XL) 25 MG 24 hr tablet Take 25 mg by mouth every evening.    Yes Historical Provider, MD  oxybutynin (DITROPAN-XL) 5 MG 24 hr tablet Take 5 mg by mouth daily. 10/16/12  Yes Historical Provider, MD  pantoprazole (PROTONIX) 40 MG tablet Take 40 mg by mouth daily.   Yes Historical Provider, MD  phenytoin (DILANTIN) 125 MG/5ML suspension  Take 125 mg by mouth every morning.   Yes Historical Provider, MD  polyethylene glycol powder (GLYCOLAX/MIRALAX) powder Take 17 g by mouth daily. 04/15/13  Yes Historical Provider, MD  primidone (MYSOLINE) 50 MG tablet Take 200 mg by mouth 3 (three) times daily.   Yes Historical Provider, MD  raloxifene (EVISTA) 60 MG tablet Take 60 mg by mouth daily.     Yes Historical Provider, MD   BP 150/77 mmHg  Pulse 59  Temp(Src) 97.5 F (36.4 C) (Oral)  Resp 20  Ht 5\' 6"  (1.676 m)  Wt 180 lb 1.9 oz (81.7 kg)  BMI 29.09 kg/m2  SpO2 100% Physical Exam   Constitutional: She appears well-developed.  HENT:  Head: Normocephalic.  Cardiovascular: Normal rate and regular rhythm.   Pulmonary/Chest: Effort normal.  Abdominal: Soft.  Neurological: She is alert.  Good grip strength bilateral. Face symmetric. Extraocular movements intact.  speech is somewhat slurred.  Skin: Skin is warm.    ED Course  Procedures (including critical care time) Labs Review Labs Reviewed  COMPREHENSIVE METABOLIC PANEL - Abnormal; Notable for the following:    Chloride 97 (*)    BUN 24 (*)    Creatinine, Ser 1.01 (*)    GFR calc non Af Amer 52 (*)    GFR calc Af Amer 60 (*)    All other components within normal limits  URINALYSIS, ROUTINE W REFLEX MICROSCOPIC (NOT AT [facility]) - Abnormal; Notable for the following:    Nitrite POSITIVE (*)    Leukocytes, UA TRACE (*)    All other components within normal limits  PHENYTOIN LEVEL, TOTAL - Abnormal; Notable for the following:    Phenytoin Lvl 8.8 (*)    All other components within normal limits  URINE MICROSCOPIC-ADD ON - Abnormal; Notable for the following:    Squamous Epithelial / LPF MANY (*)    Bacteria, UA MANY (*)    All other components within normal limits  URINE CULTURE  CBC  HEMOGLOBIN A1C  LIPID PANEL    Imaging Review Dg Chest 2 View  11/12/2014  CLINICAL DATA:  Confusion and slurred speech.  Chronic cough EXAM: CHEST  2 VIEW COMPARISON:  October 26, 2012 FINDINGS: There is mild bibasilar atelectatic type change. There is no frank edema or consolidation. The heart size and pulmonary vascularity are normal. There is a hiatal hernia present. No bone lesions. IMPRESSION: Mild bibasilar atelectasis. No edema or consolidation. Hiatal hernia present, unchanged. Heart size within normal limits. Electronically Signed   By: Lowella Grip III M.D.   On: 11/12/2014 19:47   Ct Head Wo Contrast  11/12/2014  CLINICAL DATA:  Confusion and slurred speech for 1 day EXAM: CT HEAD WITHOUT CONTRAST TECHNIQUE:  Contiguous axial images were obtained from the base of the skull through the vertex without intravenous contrast. COMPARISON:  June 23, 2010 FINDINGS: There is marked cerebellar atrophy, a stable finding. The ventricles are normal in size and configuration. There is no intracranial mass, hemorrhage, extra-axial fluid collection, or midline shift. There is a stable prior right temporal and anterior parietal infarct with encephalomalacia on the right. Elsewhere there is slight periventricular small vessel disease. There is no new gray-white compartment lesion. No acute infarct. Bony calvarium appears intact. The mastoid air cells are clear. No intraorbital lesions are appreciable on this study. 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 Slight periventricular small vessel disease. No hemorrhage, mass, or acute appearing infarct. Advanced cerebellar atrophy is stable. Electronically Signed  By: Lowella Grip III M.D.   On: 11/12/2014 19:53   I have personally reviewed and evaluated these images and lab results as part of my medical decision-making.   EKG Interpretation   Date/Time:  Wednesday November 12 2014 18:11:57 EDT Ventricular Rate:  58 PR Interval:  174 QRS Duration: 95 QT Interval:  466 QTC Calculation: 458 R Axis:   -2 Text Interpretation:  Sinus rhythm Abnormal R-wave progression, early  transition No significant change was found Confirmed by Wyvonnia Dusky  MD,  STEPHEN [phone]) on 11/12/2014 6:25:24 PM      MDM   Final diagnoses:  Lower urinary tract infection  Difficulty speaking    Patient with some difficulty speaking. Has apparent UTI. Also will likely need a stroke rule out. Last normal was possibly yesterday. Not a TPA candidate because of this. Admit to internal medicine.    Davonna Belling, MD 11/13/14 [phone]

## 2014-11-12 NOTE — ED Notes (Signed)
Pt from home, EMS reports lower back pain and bilateral leg pain since this am. Per EMS, pt family reports confusion since 8am this am. Pt alert and oriented. Airway patent. Pt family denies any known injury.

## 2014-11-12 NOTE — ED Notes (Signed)
MD at bedside. 

## 2014-11-12 NOTE — H&P (Signed)
[facility] History and Physical  Brittney Reynolds [id number] DOB: 01/06/1934 DOA: 11/12/2014  Referring physician: ER PCP: Alonza Bogus, MD   Chief Complaint: Dysarthria  HPI: Brittney Reynolds is a 81 y.o. female  This is a 81 year old lady who gives a 2 day history of slurred speech. There is no vision problems or change in balance/mobility. She normally is in a wheelchair. She has a previous history of polio. She denies any nausea, vomiting, abdominal pain, dysuria. She also complained of back pain today. Evaluation in the emergency room shows it to have a UTI, CT brain scan is not showing any acute findings. There is concern over a stroke and she is now being admitted for further investigation and management.   Review of Systems:  Apart from symptoms above, all systems are negative.  Past Medical History  Diagnosis Date  . Polio     As a child  . Anxiety   . Depression   . HTN (hypertension)   . Seizures ([place])   . Colon polyp 5 yrs ago    in  [place]  . Anemia    Past Surgical History  Procedure Laterality Date  . Otif  2009    Right ankle  . Foot surgery  11/11    Left foot   Social History:  reports that she has never smoked. She does not have any smokeless tobacco history on file. She reports that she does not drink alcohol or use illicit drugs.  Allergies  Allergen Reactions  . Acetaminophen     Patient was instructed to only take Ibuprofen by MD     Family history: No history of stroke or polio in the family.   Prior to Admission medications   Medication Sig Start Date End Date Taking? Authorizing Provider  Calcium Carbonate-Vitamin D (CALCIUM + D PO) Take 600 mg by mouth 2 (two) times daily.    Yes Historical Provider, MD  diltiazem (TIAZAC) 360 MG 24 hr capsule Take 360 mg by mouth daily. 10/23/12  Yes Historical Provider, MD  donepezil (ARICEPT) 5 MG tablet Take 5 mg by mouth at bedtime.   Yes Historical Provider, MD  escitalopram  (LEXAPRO) 10 MG tablet Take 10 mg by mouth daily.     Yes Historical Provider, MD  ferrous sulfate 325 (65 FE) MG tablet Take 325 mg by mouth 2 (two) times daily with a meal.   Yes Historical Provider, MD  levothyroxine (SYNTHROID, LEVOTHROID) 100 MCG tablet Take 100 mcg by mouth daily before breakfast.   Yes Historical Provider, MD  losartan-hydrochlorothiazide (HYZAAR) 100-12.5 MG per tablet Take 1 tablet by mouth daily. 05/09/13  Yes Historical Provider, MD  metoprolol succinate (TOPROL-XL) 25 MG 24 hr tablet Take 25 mg by mouth every evening.    Yes Historical Provider, MD  oxybutynin (DITROPAN-XL) 5 MG 24 hr tablet Take 5 mg by mouth daily. 10/16/12  Yes Historical Provider, MD  pantoprazole (PROTONIX) 40 MG tablet Take 40 mg by mouth daily.   Yes Historical Provider, MD  phenytoin (DILANTIN) 125 MG/5ML suspension Take 125 mg by mouth every morning.   Yes Historical Provider, MD  polyethylene glycol powder (GLYCOLAX/MIRALAX) powder Take 17 g by mouth daily. 04/15/13  Yes Historical Provider, MD  primidone (MYSOLINE) 50 MG tablet Take 200 mg by mouth 3 (three) times daily.   Yes Historical Provider, MD  raloxifene (EVISTA) 60 MG tablet Take 60 mg by mouth daily.     Yes Historical Provider, MD  Physical Exam: Filed Vitals:   11/12/14 1806 11/12/14 1830 11/12/14 2038  BP: 150/75 140/62 133/51  Pulse: 92 57 63  Temp: 97.5 F (36.4 C)    TempSrc: Oral    Resp: 18 16 16   Height: 5\' 7"  (1.702 m)    Weight: 77.111 kg (170 lb)    SpO2: 98% 98% 91%    Wt Readings from Last 3 Encounters:  11/12/14 77.111 kg (170 lb)  05/25/13 77.111 kg (170 lb)  10/26/12 75.297 kg (166 lb)    General:  Appears calm and comfortable. She clearly has dysarthria. Eyes: PERRL, normal lids, irises & conjunctiva ENT: grossly normal hearing, lips & tongue Neck: no LAD, masses or thyromegaly Cardiovascular: RRR, no m/r/g. No LE edema. Telemetry: SR, no arrhythmias  Respiratory: CTA bilaterally, no w/r/r. Normal  respiratory effort. Abdomen: soft, ntnd Skin: no rash or induration seen on limited exam Musculoskeletal: grossly normal tone BUE/BLE Psychiatric: grossly normal mood and affect, speech fluent and appropriate Neurologic: grossly non-focal. apart from dysarthria, there are no other cerebellar signs and no other focal neurological signs.           Labs on Admission:  Basic Metabolic Panel:  Recent Labs Lab 11/12/14 1823  NA 137  K 3.9  CL 97*  CO2 31  GLUCOSE 81  BUN 24*  CREATININE 1.01*  CALCIUM 9.6   Liver Function Tests:  Recent Labs Lab 11/12/14 1823  AST 24  ALT 15  ALKPHOS 60  BILITOT 0.4  PROT 7.6  ALBUMIN 4.2   No results for input(s): LIPASE, AMYLASE in the last 168 hours. No results for input(s): AMMONIA in the last 168 hours. CBC:  Recent Labs Lab 11/12/14 1823  WBC 5.5  HGB 12.8  HCT 37.8  MCV 94.7  PLT 192   Cardiac Enzymes: No results for input(s): CKTOTAL, CKMB, CKMBINDEX, TROPONINI in the last 168 hours.  BNP (last 3 results) No results for input(s): BNP in the last 8760 hours.  ProBNP (last 3 results) No results for input(s): PROBNP in the last 8760 hours.  CBG: No results for input(s): GLUCAP in the last 168 hours.  Radiological Exams on Admission: Dg Chest 2 View  11/12/2014  CLINICAL DATA:  Confusion and slurred speech.  Chronic cough EXAM: CHEST  2 VIEW COMPARISON:  October 26, 2012 FINDINGS: There is mild bibasilar atelectatic type change. There is no frank edema or consolidation. The heart size and pulmonary vascularity are normal. There is a hiatal hernia present. No bone lesions. IMPRESSION: Mild bibasilar atelectasis. No edema or consolidation. Hiatal hernia present, unchanged. Heart size within normal limits. Electronically Signed   By: Lowella Grip III M.D.   On: 11/12/2014 19:47   Ct Head Wo Contrast  11/12/2014  CLINICAL DATA:  Confusion and slurred speech for 1 day EXAM: CT HEAD WITHOUT CONTRAST TECHNIQUE:  Contiguous axial images were obtained from the base of the skull through the vertex without intravenous contrast. COMPARISON:  June 23, 2010 FINDINGS: There is marked cerebellar atrophy, a stable finding. The ventricles are normal in size and configuration. There is no intracranial mass, hemorrhage, extra-axial fluid collection, or midline shift. There is a stable prior right temporal and anterior parietal infarct with encephalomalacia on the right. Elsewhere there is slight periventricular small vessel disease. There is no new gray-white compartment lesion. No acute infarct. Bony calvarium appears intact. The mastoid air cells are clear. No intraorbital lesions are appreciable on this study. IMPRESSION: Prior right temporal and anterior parietal infarct  with encephalomalacia, stable. Slight periventricular small vessel disease. No hemorrhage, mass, or acute appearing infarct. Advanced cerebellar atrophy is stable. Electronically Signed   By: Lowella Grip III M.D.   On: 11/12/2014 19:53      Assessment/Plan   1. Dysarthria. This may represent a stroke. I will initiate stroke workup. Neurology consultation. 2. UTI. Intravenous Rocephin. 3. Hypertension. Continue with all medications. 4. Seizure disorder. Continue with anticonvulsants.  She'll be admitted to telemetry. Further recommendations will depend on patient's hospital progress.   Code Status: Full code.  DVT Prophylaxis: Lovenox.  Family Communication: I discussed the plan with the patient at the bedside.   Disposition Plan: Home when medically stable.   Time spent: 60 minutes.  Doree Albee [facility] Pager [phone].

## 2014-11-13 ENCOUNTER — Inpatient Hospital Stay (HOSPITAL_COMMUNITY): Payer: Medicare Other

## 2014-11-13 DIAGNOSIS — I6350 Cerebral infarction due to unspecified occlusion or stenosis of unspecified cerebral artery: Secondary | ICD-10-CM

## 2014-11-13 DIAGNOSIS — I10 Essential (primary) hypertension: Secondary | ICD-10-CM

## 2014-11-13 LAB — LIPID PANEL
CHOL/HDL RATIO: 3.3 ratio
Cholesterol: 184 mg/dL (ref 0–200)
HDL: 55 mg/dL (ref >40)
LDL Cholesterol: 120 mg/dL — ABNORMAL HIGH (ref 0–99)
TRIGLYCERIDES: 43 mg/dL (ref <150)
VLDL: 9 mg/dL (ref 0–40)

## 2014-11-13 MED ORDER — POLYETHYLENE GLYCOL 3350 17 G PO PACK
17.0000 g | PACK | Freq: Every day | ORAL | Status: DC
  Administered 2014-11-14: 17 g via ORAL
  Filled 2014-11-13: qty 1

## 2014-11-13 MED ORDER — ATORVASTATIN CALCIUM 20 MG PO TABS
20.0000 mg | ORAL_TABLET | Freq: Every day | ORAL | Status: DC
  Administered 2014-11-13: 20 mg via ORAL
  Filled 2014-11-13: qty 1

## 2014-11-13 NOTE — Consult Note (Signed)
[place] A. Merlene Laughter, MD     [email or web address]          Brittney Reynolds is an 82 y.o. female.   ASSESSMENT/PLAN: Acute alteration in mentation likely due to toxic metabolic problems from UTI. Worsening gait impairment likely due to above. Progressive gait impairment likely multifactorial including osteoarthritis, post polio syndrome, previous brain insult (congenital abnormalities versus stroke) and aging. Epilepsy likely due to previous brain insults.  RECOMMENDATION: Continue with current care. Check phenobarbital and Mysoline levels. Continue with Mysoline and Dilantin at the current doses.  The patient is a 81 year old white female who presents with acute onset of dysarthria, altered mental status and worsening gait impairment. The patient's niece reports that she has had deterioration in in her mobility to ambulate over the last 2-3 years. She currently stays in a wheelchair most times although she does move around in the house with much assistance. The patient workup has been significant for UTI. She has been treated appropriately and has had improvement in cognition. She denies any headaches, focal numbness or weakness. There is no chest pain or shortness of breath. There is no GI GU symptoms. The review of systems otherwise negative.  GENERAL: This a very pleasant female in no acute distress.  HEENT: Supple. Atraumatic normocephalic.   ABDOMEN: soft  EXTREMITIES: No edema. There is marked arthritic changes of the hands and knees. There is contracture of the right little finger and adjacent finger due to severe arthritic changes. It is associated with ligamental hypertrophy.   BACK: Normal.  SKIN: Normal by inspection.    MENTAL STATUS: Alert and oriented. Speech, language and cognition are generally intact. Judgment and insight normal.   CRANIAL NERVES: Pupils are equal, round and reactive to light and accommodation; extra ocular movements are  full, there is no significant nystagmus; visual fields are full; upper and lower facial muscles are normal in strength and symmetric, there is no flattening of the nasolabial folds; tongue is midline; uvula is midline; shoulder elevation is normal.  MOTOR: Normal tone, bulk and strength; no pronator drift.  COORDINATION: Left finger to nose is normal, right finger to nose is normal, No rest tremor; no intention tremor; no postural tremor; no bradykinesia.  REFLEXES: Deep tendon reflexes are symmetrical and normal. Babinski reflexes are flexor bilaterally.   SENSATION: Normal to light touch.   Brain MRI is reviewed in person. There is a large encephalomalacia involving the right frontotemporal region consistent with previous insult or infarct. Nothing acute is seen on diffusion imaging. There is mild to moderate global atrophy. There is mild atrophy of the vermis but I do not appreciate extensive cerebral atrophy.   Blood pressure 128/56, pulse 74, temperature 97.3 F (36.3 C), temperature source Oral, resp. rate 18, height 5' 6"  (1.676 m), weight 81.7 kg (180 lb 1.9 oz), SpO2 95 %.  Past Medical History  Diagnosis Date  . Polio     As a child  . Anxiety   . Depression   . HTN (hypertension)   . Seizures ([place])   . Colon polyp 5 yrs ago    in  [place]  . Anemia     Past Surgical History  Procedure Laterality Date  . Otif  2009    Right ankle  . Foot surgery  11/11    Left foot    History reviewed. No pertinent family history.  Social History:  reports that she has never smoked. She does not have any smokeless tobacco history  on file. She reports that she does not drink alcohol or use illicit drugs.  Allergies:  Allergies  Allergen Reactions  . Acetaminophen     Patient was instructed to only take Ibuprofen by MD    Medications: Prior to Admission medications   Medication Sig Start Date End Date Taking? Authorizing Provider  Calcium Carbonate-Vitamin D (CALCIUM  + D PO) Take 600 mg by mouth 2 (two) times daily.    Yes Historical Provider, MD  diltiazem (TIAZAC) 360 MG 24 hr capsule Take 360 mg by mouth daily. 10/23/12  Yes Historical Provider, MD  donepezil (ARICEPT) 5 MG tablet Take 5 mg by mouth at bedtime.   Yes Historical Provider, MD  escitalopram (LEXAPRO) 10 MG tablet Take 10 mg by mouth daily.     Yes Historical Provider, MD  ferrous sulfate 325 (65 FE) MG tablet Take 325 mg by mouth 2 (two) times daily with a meal.   Yes Historical Provider, MD  levothyroxine (SYNTHROID, LEVOTHROID) 100 MCG tablet Take 100 mcg by mouth daily before breakfast.   Yes Historical Provider, MD  losartan-hydrochlorothiazide (HYZAAR) 100-12.5 MG per tablet Take 1 tablet by mouth daily. 05/09/13  Yes Historical Provider, MD  metoprolol succinate (TOPROL-XL) 25 MG 24 hr tablet Take 25 mg by mouth every evening.    Yes Historical Provider, MD  oxybutynin (DITROPAN-XL) 5 MG 24 hr tablet Take 5 mg by mouth daily. 10/16/12  Yes Historical Provider, MD  pantoprazole (PROTONIX) 40 MG tablet Take 40 mg by mouth daily.   Yes Historical Provider, MD  phenytoin (DILANTIN) 125 MG/5ML suspension Take 125 mg by mouth every morning.   Yes Historical Provider, MD  polyethylene glycol powder (GLYCOLAX/MIRALAX) powder Take 17 g by mouth daily. 04/15/13  Yes Historical Provider, MD  primidone (MYSOLINE) 50 MG tablet Take 200 mg by mouth 3 (three) times daily.   Yes Historical Provider, MD  raloxifene (EVISTA) 60 MG tablet Take 60 mg by mouth daily.     Yes Historical Provider, MD    Scheduled Meds: .  stroke: mapping our early stages of recovery book   Does not apply Once  . atorvastatin  20 mg Oral q1800  . cefTRIAXone (ROCEPHIN)  IV  1 g Intravenous Q24H  . diltiazem  360 mg Oral Daily  . donepezil  5 mg Oral QHS  . enoxaparin (LOVENOX) injection  40 mg Subcutaneous Q24H  . escitalopram  10 mg Oral Daily  . ferrous sulfate  325 mg Oral BID WC  . hydrochlorothiazide  12.5 mg Oral Daily    . levothyroxine  100 mcg Oral QAC breakfast  . losartan  100 mg Oral Daily  . metoprolol succinate  25 mg Oral QPM  . oxybutynin  5 mg Oral Daily  . pantoprazole  40 mg Oral Daily  . phenytoin  125 mg Oral q morning - 10a  . polyethylene glycol  17 g Oral Daily  . primidone  200 mg Oral TID  . raloxifene  60 mg Oral Daily   Continuous Infusions: . sodium chloride 50 mL/hr at 11/12/14 2345   PRN Meds:.     Results for orders placed or performed during the hospital encounter of 11/12/14 (from the past 48 hour(s))  Comprehensive metabolic panel     Status: Abnormal   Collection Time: 11/12/14  6:23 PM  Result Value Ref Range   Sodium 137 135 - 145 mmol/L   Potassium 3.9 3.5 - 5.1 mmol/L   Chloride 97 (L) 101 -  111 mmol/L   CO2 31 22 - 32 mmol/L   Glucose, Bld 81 65 - 99 mg/dL   BUN 24 (H) 6 - 20 mg/dL   Creatinine, Ser 1.01 (H) 0.44 - 1.00 mg/dL   Calcium 9.6 8.9 - 10.3 mg/dL   Total Protein 7.6 6.5 - 8.1 g/dL   Albumin 4.2 3.5 - 5.0 g/dL   AST 24 15 - 41 U/L   ALT 15 14 - 54 U/L   Alkaline Phosphatase 60 38 - 126 U/L   Total Bilirubin 0.4 0.3 - 1.2 mg/dL   GFR calc non Af Amer 52 (L) >60 mL/min   GFR calc Af Amer 60 (L) >60 mL/min    Comment: (NOTE) The eGFR has been calculated using the CKD EPI equation. This calculation has not been validated in all clinical situations. eGFR's persistently <60 mL/min signify possible Chronic Kidney Disease.    Anion gap 9 5 - 15  CBC     Status: None   Collection Time: 11/12/14  6:23 PM  Result Value Ref Range   WBC 5.5 4.0 - 10.5 K/uL   RBC 3.99 3.87 - 5.11 MIL/uL   Hemoglobin 12.8 12.0 - 15.0 g/dL   HCT 37.8 36.0 - 46.0 %   MCV 94.7 78.0 - 100.0 fL   MCH 32.1 26.0 - 34.0 pg   MCHC 33.9 30.0 - 36.0 g/dL   RDW 12.7 11.5 - 15.5 %   Platelets 192 150 - 400 K/uL  Phenytoin level, total     Status: Abnormal   Collection Time: 11/12/14  6:23 PM  Result Value Ref Range   Phenytoin Lvl 8.8 (L) 10.0 - 20.0 ug/mL  Urinalysis,  Routine w reflex microscopic (not at [facility])     Status: Abnormal   Collection Time: 11/12/14  7:10 PM  Result Value Ref Range   Color, Urine YELLOW YELLOW   APPearance CLEAR CLEAR   Specific Gravity, Urine 1.015 1.005 - 1.030   pH 6.5 5.0 - 8.0   Glucose, UA NEGATIVE NEGATIVE mg/dL   Hgb urine dipstick NEGATIVE NEGATIVE   Bilirubin Urine NEGATIVE NEGATIVE   Ketones, ur NEGATIVE NEGATIVE mg/dL   Protein, ur NEGATIVE NEGATIVE mg/dL   Urobilinogen, UA 0.2 0.0 - 1.0 mg/dL   Nitrite POSITIVE (A) NEGATIVE   Leukocytes, UA TRACE (A) NEGATIVE  Urine microscopic-add on     Status: Abnormal   Collection Time: 11/12/14  7:10 PM  Result Value Ref Range   Squamous Epithelial / LPF MANY (A) RARE   WBC, UA 3-6 <3 WBC/hpf   RBC / HPF 0-2 <3 RBC/hpf   Bacteria, UA MANY (A) RARE  Lipid panel     Status: Abnormal   Collection Time: 11/13/14  5:39 AM  Result Value Ref Range   Cholesterol 184 0 - 200 mg/dL   Triglycerides 43 <150 mg/dL   HDL 55 >40 mg/dL   Total CHOL/HDL Ratio 3.3 RATIO   VLDL 9 0 - 40 mg/dL   LDL Cholesterol 120 (H) 0 - 99 mg/dL    Comment:        Total Cholesterol/HDL:CHD Risk Coronary Heart Disease Risk Table                     Men   Women  1/2 Average Risk   3.4   3.3  Average Risk       5.0   4.4  2 X Average Risk   9.6   7.1  3 X  Average Risk  23.4   11.0        Use the calculated Patient Ratio above and the CHD Risk Table to determine the patient's CHD Risk.        ATP III CLASSIFICATION (LDL):  <100     mg/dL   Optimal  100-129  mg/dL   Near or Above                    Optimal  130-159  mg/dL   Borderline  160-189  mg/dL   High  >190     mg/dL   Very High     Studies/Results: BRAIN MRI/MRA IMPRESSION: Significantly motion degraded scan. Marked cerebellar atrophy out of proportion to the degree of cerebral atrophy, favor remote manifestation of poliomyelitis. Remote ischemic event versus congenital brain substance loss RIGHT hemisphere, also stable from  2010. No acute stroke is evident. No large vessel occlusion is demonstrated.    Brittney Reynolds A. Merlene Laughter, M.D.  Diplomate, Tax adviser of Psychiatry and Neurology ( Neurology). 11/13/2014, 5:24 PM

## 2014-11-13 NOTE — Evaluation (Signed)
Physical Therapy Evaluation Patient Details Name: Brittney Reynolds MRN: [id number] DOB: 01-25-1933 Today's Date: 11/13/2014   History of Present Illness  This is a 81 year old lady who gives a 2 day history of slurred speech. There is no vision problems or change in balance/mobility. She normally is in a wheelchair. She has a previous history of polio. She denies any nausea, vomiting, abdominal pain, dysuria. She also complained of back pain today. Evaluation in the emergency room shows it to have a UTI, CT brain scan is not showing any acute findings. There is concern over a stroke and she is now being admitted for further investigation and management.  Clinical Impression  Pt was seen for evaluation and found to be at functional baseline.  She is normally able to ambulate with a standard walker and supervision for limited distances in the home.  She has excellent support, all scans have been negative for stroke.  She has adequate DME.  No further PT is needed.    Follow Up Recommendations No PT follow up    Equipment Recommendations  None recommended by PT    Recommendations for Other Services   none    Precautions / Restrictions Precautions Precautions: Fall Restrictions Weight Bearing Restrictions: No      Mobility  Bed Mobility Overal bed mobility: Needs Assistance Bed Mobility: Supine to Sit     Supine to sit: Supervision;HOB elevated        Transfers Overall transfer level: Needs assistance Equipment used: Standard walker Transfers: Sit to/from Stand Sit to Stand: Supervision            Ambulation/Gait Ambulation/Gait assistance: Modified independent (Device/Increase time) Ambulation Distance (Feet): 15 Feet Assistive device: Standard walker Gait Pattern/deviations: WFL(Within Functional Limits) Gait velocity: appropriate for situation Gait velocity interpretation: <1.8 ft/sec, indicative of risk for recurrent falls    Stairs             Wheelchair Mobility    Modified Rankin (Stroke Patients Only)       Balance Overall balance assessment: No apparent balance deficits (not formally assessed)                                           Pertinent Vitals/Pain Pain Assessment: No/denies pain Pain Score: 3  Pain Location: left hip Pain Descriptors / Indicators: Aching Pain Intervention(s): Limited activity within patient's tolerance;Monitored during session    Home Living Family/patient expects to be discharged to:: Private residence Living Arrangements: Other relatives Available Help at Discharge: Personal care attendant;Available 24 hours/day Type of Home: House         Home Equipment: Wheelchair - manual;Bedside commode;Shower seat      Prior Function Level of Independence: Needs assistance   Gait / Transfers Assistance Needed: pt able to transfer with SBA, uses a w/c independently  ADL's / Homemaking Assistance Needed: Pt requires assistance for all ADL tasks, aide is present 24/7 to assist pt and her sister        Hand Dominance   Dominant Hand: Right    Extremity/Trunk Assessment   Upper Extremity Assessment: Defer to OT evaluation           Lower Extremity Assessment: LLE deficits/detail   LLE Deficits / Details: mild weakness LLE as compared to the RLE due to old polio     Communication   Communication:  (mild slurring of speech)  Cognition  Arousal/Alertness: Awake/alert Behavior During Therapy: WFL for tasks assessed/performed Overall Cognitive Status: Within Functional Limits for tasks assessed       Memory: Decreased short-term memory              General Comments      Exercises        Assessment/Plan    PT Assessment Patent does not need any further PT services  PT Diagnosis     PT Problem List    PT Treatment Interventions     PT Goals (Current goals can be found in the Care Plan section) Acute Rehab PT Goals PT Goal Formulation: All  assessment and education complete, DC therapy    Frequency     Barriers to discharge        Co-evaluation               End of Session Equipment Utilized During Treatment: Gait belt Activity Tolerance: Patient tolerated treatment well Patient left: in chair;with call bell/phone within reach Nurse Communication: Mobility status    Functional Assessment Tool Used: clinical judgement Functional Limitation: Mobility: Walking and moving around Mobility: Walking and Moving Around Current Status [id number]): At least 1 percent but less than 20 percent impaired, limited or restricted Mobility: Walking and Moving Around Goal Status [phone]): At least 1 percent but less than 20 percent impaired, limited or restricted Mobility: Walking and Moving Around Discharge Status [phone]): At least 1 percent but less than 20 percent impaired, limited or restricted    Time: 0940-1010 PT Time Calculation (min) (ACUTE ONLY): 30 min   Charges:   PT Evaluation $Initial PT Evaluation Tier I: 1 Procedure     PT G Codes:   PT G-Codes **NOT FOR INPATIENT CLASS** Functional Assessment Tool Used: clinical judgement Functional Limitation: Mobility: Walking and moving around Mobility: Walking and Moving Around Current Status [id number]): At least 1 percent but less than 20 percent impaired, limited or restricted Mobility: Walking and Moving Around Goal Status [phone]): At least 1 percent but less than 20 percent impaired, limited or restricted Mobility: Walking and Moving Around Discharge Status [phone]): At least 1 percent but less than 20 percent impaired, limited or restricted    Sable Feil  PT 11/13/2014, 10:15 AM [phone]

## 2014-11-13 NOTE — Progress Notes (Signed)
1840 - Patient reports dizziness improving.

## 2014-11-13 NOTE — Progress Notes (Signed)
Patient sitting on edge of bed eating evening meal.  Reports feeling dizzy.  VS WNL (see 1722 flowsheet).  Dr. Everette Rank paged.  Patient resting in bed.  Dr. Everette Rank returned page and notified.  No new orders.  Continue to monitor.  Dr. Merlene Laughter to see patient this evening.

## 2014-11-13 NOTE — Progress Notes (Signed)
Subjective: She was admitted yesterday with altered mental status, slurred speech and difficulty with balance. She has what appears to be a urinary tract infection. CT did not show an acute stroke and she is scheduled for MRI today. she says she feels better now. She has less slurred speech and she is more alert  Objective: Vital signs in last 24 hours: Temp:  [97 F (36.1 C)-97.5 F (36.4 C)] 97 F (36.1 C) (10/27 0653) Pulse Rate:  [53-92] 60 (10/27 0653) Resp:  [16-20] 20 (10/27 0653) BP: (128-150)/(51-93) 148/65 mmHg (10/27 0653) SpO2:  [91 %-100 %] 96 % (10/27 0653) Weight:  [77.111 kg (170 lb)-81.7 kg (180 lb 1.9 oz)] 81.7 kg (180 lb 1.9 oz) (10/26 2253) Weight change:  Last BM Date: 11/12/14  Intake/Output from previous day: 10/26 0701 - 10/27 0700 In: 327.5 [I.V.:327.5] Out: -   PHYSICAL EXAM General appearance: alert, cooperative and mild distress Resp: clear to auscultation bilaterally Cardio: regular rate and rhythm, S1, S2 normal, no murmur, click, rub or gallop GI: soft, non-tender; bowel sounds normal; no masses,  no organomegaly Extremities: extremities normal, atraumatic, no cyanosis or edema  Lab Results:  Results for orders placed or performed during the hospital encounter of 11/12/14 (from the past 48 hour(s))  Comprehensive metabolic panel     Status: Abnormal   Collection Time: 11/12/14  6:23 PM  Result Value Ref Range   Sodium 137 135 - 145 mmol/L   Potassium 3.9 3.5 - 5.1 mmol/L   Chloride 97 (L) 101 - 111 mmol/L   CO2 31 22 - 32 mmol/L   Glucose, Bld 81 65 - 99 mg/dL   BUN 24 (H) 6 - 20 mg/dL   Creatinine, Ser 1.01 (H) 0.44 - 1.00 mg/dL   Calcium 9.6 8.9 - 10.3 mg/dL   Total Protein 7.6 6.5 - 8.1 g/dL   Albumin 4.2 3.5 - 5.0 g/dL   AST 24 15 - 41 U/L   ALT 15 14 - 54 U/L   Alkaline Phosphatase 60 38 - 126 U/L   Total Bilirubin 0.4 0.3 - 1.2 mg/dL   GFR calc non Af Amer 52 (L) >60 mL/min   GFR calc Af Amer 60 (L) >60 mL/min    Comment:  (NOTE) The eGFR has been calculated using the CKD EPI equation. This calculation has not been validated in all clinical situations. eGFR's persistently <60 mL/min signify possible Chronic Kidney Disease.    Anion gap 9 5 - 15  CBC     Status: None   Collection Time: 11/12/14  6:23 PM  Result Value Ref Range   WBC 5.5 4.0 - 10.5 K/uL   RBC 3.99 3.87 - 5.11 MIL/uL   Hemoglobin 12.8 12.0 - 15.0 g/dL   HCT 37.8 36.0 - 46.0 %   MCV 94.7 78.0 - 100.0 fL   MCH 32.1 26.0 - 34.0 pg   MCHC 33.9 30.0 - 36.0 g/dL   RDW 12.7 11.5 - 15.5 %   Platelets 192 150 - 400 K/uL  Phenytoin level, total     Status: Abnormal   Collection Time: 11/12/14  6:23 PM  Result Value Ref Range   Phenytoin Lvl 8.8 (L) 10.0 - 20.0 ug/mL  Urinalysis, Routine w reflex microscopic (not at [facility])     Status: Abnormal   Collection Time: 11/12/14  7:10 PM  Result Value Ref Range   Color, Urine YELLOW YELLOW   APPearance CLEAR CLEAR   Specific Gravity, Urine 1.015 1.005 - 1.030  pH 6.5 5.0 - 8.0   Glucose, UA NEGATIVE NEGATIVE mg/dL   Hgb urine dipstick NEGATIVE NEGATIVE   Bilirubin Urine NEGATIVE NEGATIVE   Ketones, ur NEGATIVE NEGATIVE mg/dL   Protein, ur NEGATIVE NEGATIVE mg/dL   Urobilinogen, UA 0.2 0.0 - 1.0 mg/dL   Nitrite POSITIVE (A) NEGATIVE   Leukocytes, UA TRACE (A) NEGATIVE  Urine microscopic-add on     Status: Abnormal   Collection Time: 11/12/14  7:10 PM  Result Value Ref Range   Squamous Epithelial / LPF MANY (A) RARE   WBC, UA 3-6 <3 WBC/hpf   RBC / HPF 0-2 <3 RBC/hpf   Bacteria, UA MANY (A) RARE  Lipid panel     Status: Abnormal   Collection Time: 11/13/14  5:39 AM  Result Value Ref Range   Cholesterol 184 0 - 200 mg/dL   Triglycerides 43 <150 mg/dL   HDL 55 >40 mg/dL   Total CHOL/HDL Ratio 3.3 RATIO   VLDL 9 0 - 40 mg/dL   LDL Cholesterol 120 (H) 0 - 99 mg/dL    Comment:        Total Cholesterol/HDL:CHD Risk Coronary Heart Disease Risk Table                     Men   Women  1/2  Average Risk   3.4   3.3  Average Risk       5.0   4.4  2 X Average Risk   9.6   7.1  3 X Average Risk  23.4   11.0        Use the calculated Patient Ratio above and the CHD Risk Table to determine the patient's CHD Risk.        ATP III CLASSIFICATION (LDL):  <100     mg/dL   Optimal  100-129  mg/dL   Near or Above                    Optimal  130-159  mg/dL   Borderline  160-189  mg/dL   High  >190     mg/dL   Very High     ABGS No results for input(s): PHART, PO2ART, TCO2, HCO3 in the last 72 hours.  Invalid input(s): PCO2 CULTURES No results found for this or any previous visit (from the past 240 hour(s)). Studies/Results: Dg Chest 2 View  11/12/2014  CLINICAL DATA:  Confusion and slurred speech.  Chronic cough EXAM: CHEST  2 VIEW COMPARISON:  October 26, 2012 FINDINGS: There is mild bibasilar atelectatic type change. There is no frank edema or consolidation. The heart size and pulmonary vascularity are normal. There is a hiatal hernia present. No bone lesions. IMPRESSION: Mild bibasilar atelectasis. No edema or consolidation. Hiatal hernia present, unchanged. Heart size within normal limits. Electronically Signed   By: Lowella Grip III M.D.   On: 11/12/2014 19:47   Ct Head Wo Contrast  11/12/2014  CLINICAL DATA:  Confusion and slurred speech for 1 day EXAM: CT HEAD WITHOUT CONTRAST TECHNIQUE: Contiguous axial images were obtained from the base of the skull through the vertex without intravenous contrast. COMPARISON:  June 23, 2010 FINDINGS: There is marked cerebellar atrophy, a stable finding. The ventricles are normal in size and configuration. There is no intracranial mass, hemorrhage, extra-axial fluid collection, or midline shift. There is a stable prior right temporal and anterior parietal infarct with encephalomalacia on the right. Elsewhere there is slight periventricular small vessel disease. There is  no new gray-white compartment lesion. No acute infarct. Bony  calvarium appears intact. The mastoid air cells are clear. No intraorbital lesions are appreciable on this study. 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 Slight periventricular small vessel disease. No hemorrhage, mass, or acute appearing infarct. Advanced cerebellar atrophy is stable. Electronically Signed   By: Lowella Grip III M.D.   On: 11/12/2014 19:53   Mr Jodene Nam Head Wo Contrast  11/13/2014  CLINICAL DATA:  LEFT-sided weakness and confusion for 2 days. EXAM: MRI HEAD WITHOUT CONTRAST MRA HEAD WITHOUT CONTRAST TECHNIQUE: Multiplanar, multiecho pulse sequences of the brain and surrounding structures were obtained without intravenous contrast. Angiographic images of the head were obtained using MRA technique without contrast. COMPARISON:  CT head 11/12/2014, also CT head 04/21/2008. FINDINGS: MRI HEAD FINDINGS The patient was unable to remain motionless for the exam. Small or subtle lesions could be overlooked. Marked cerebellar atrophy, out of proportion to the degree of cerebral atrophy. This could be a central (bulbar) manifestation of poliomyelitis. Remote ischemic event (versus congenital brain substance loss) RIGHT hemisphere affecting the frontal, temporal, and parietal lobes stable from 2010. Hypertrophic change of the adjacent calvarium indicates longstanding process. No restricted diffusion, acute hemorrhage, mass lesion, hydrocephalus, or extra-axial fluid collection. Minor white matter disease, nonspecific. Mild gliosis along the margin of the RIGHT hemisphere cerebral volume loss. Flow voids are maintained in the carotid, basilar, and LEFT vertebral arteries; RIGHT vertebral appears to terminate in PICA. Pituitary and cerebellar tonsils unremarkable. No acute sinus or mastoid disease. BILATERAL cataract extraction. MRA HEAD FINDINGS Motion degraded exam.  Images are marginally diagnostic. Gross patency of the internal carotid arteries and  basilar artery is established. The LEFT vertebral is dominant with no definite flow reducing stenosis. Gross patency of the anterior, middle, and posterior cerebral arteries is noted. There is a fetal origin to the RIGHT PCA. No large intracranial aneurysm is observed. IMPRESSION: Significantly motion degraded scan. Marked cerebellar atrophy out of proportion to the degree of cerebral atrophy, favor remote manifestation of poliomyelitis. Remote ischemic event versus congenital brain substance loss RIGHT hemisphere, also stable from 2010. No acute stroke is evident. No large vessel occlusion is demonstrated. Electronically Signed   By: Staci Righter M.D.   On: 11/13/2014 08:33   Mr Brain Wo Contrast  11/13/2014  CLINICAL DATA:  LEFT-sided weakness and confusion for 2 days. EXAM: MRI HEAD WITHOUT CONTRAST MRA HEAD WITHOUT CONTRAST TECHNIQUE: Multiplanar, multiecho pulse sequences of the brain and surrounding structures were obtained without intravenous contrast. Angiographic images of the head were obtained using MRA technique without contrast. COMPARISON:  CT head 11/12/2014, also CT head 04/21/2008. FINDINGS: MRI HEAD FINDINGS The patient was unable to remain motionless for the exam. Small or subtle lesions could be overlooked. Marked cerebellar atrophy, out of proportion to the degree of cerebral atrophy. This could be a central (bulbar) manifestation of poliomyelitis. Remote ischemic event (versus congenital brain substance loss) RIGHT hemisphere affecting the frontal, temporal, and parietal lobes stable from 2010. Hypertrophic change of the adjacent calvarium indicates longstanding process. No restricted diffusion, acute hemorrhage, mass lesion, hydrocephalus, or extra-axial fluid collection. Minor white matter disease, nonspecific. Mild gliosis along the margin of the RIGHT hemisphere cerebral volume loss. Flow voids are maintained in the carotid, basilar, and LEFT vertebral arteries; RIGHT vertebral appears  to terminate in PICA. Pituitary and cerebellar tonsils unremarkable. No acute sinus or mastoid disease. BILATERAL cataract extraction. MRA HEAD FINDINGS Motion degraded exam.  Images are  marginally diagnostic. Gross patency of the internal carotid arteries and basilar artery is established. The LEFT vertebral is dominant with no definite flow reducing stenosis. Gross patency of the anterior, middle, and posterior cerebral arteries is noted. There is a fetal origin to the RIGHT PCA. No large intracranial aneurysm is observed. IMPRESSION: Significantly motion degraded scan. Marked cerebellar atrophy out of proportion to the degree of cerebral atrophy, favor remote manifestation of poliomyelitis. Remote ischemic event versus congenital brain substance loss RIGHT hemisphere, also stable from 2010. No acute stroke is evident. No large vessel occlusion is demonstrated. Electronically Signed   By: Staci Righter M.D.   On: 11/13/2014 08:33    Medications:  Prior to Admission:  Prescriptions prior to admission  Medication Sig Dispense Refill Last Dose  . Calcium Carbonate-Vitamin D (CALCIUM + D PO) Take 600 mg by mouth 2 (two) times daily.    11/12/2014 at Unknown time  . diltiazem (TIAZAC) 360 MG 24 hr capsule Take 360 mg by mouth daily.   11/12/2014 at Unknown time  . donepezil (ARICEPT) 5 MG tablet Take 5 mg by mouth at bedtime.   11/11/2014 at Unknown time  . escitalopram (LEXAPRO) 10 MG tablet Take 10 mg by mouth daily.     11/12/2014 at Unknown time  . ferrous sulfate 325 (65 FE) MG tablet Take 325 mg by mouth 2 (two) times daily with a meal.   11/12/2014 at Unknown time  . levothyroxine (SYNTHROID, LEVOTHROID) 100 MCG tablet Take 100 mcg by mouth daily before breakfast.   11/12/2014 at Unknown time  . losartan-hydrochlorothiazide (HYZAAR) 100-12.5 MG per tablet Take 1 tablet by mouth daily.   11/12/2014 at Unknown time  . metoprolol succinate (TOPROL-XL) 25 MG 24 hr tablet Take 25 mg by mouth every  evening.    11/11/2014 at 2000  . oxybutynin (DITROPAN-XL) 5 MG 24 hr tablet Take 5 mg by mouth daily.   11/12/2014 at Unknown time  . pantoprazole (PROTONIX) 40 MG tablet Take 40 mg by mouth daily.   11/12/2014 at Unknown time  . phenytoin (DILANTIN) 125 MG/5ML suspension Take 125 mg by mouth every morning.   11/12/2014 at [place]  . polyethylene glycol powder (GLYCOLAX/MIRALAX) powder Take 17 g by mouth daily.   11/12/2014 at Unknown time  . primidone (MYSOLINE) 50 MG tablet Take 200 mg by mouth 3 (three) times daily.   11/12/2014 at Unknown time  . raloxifene (EVISTA) 60 MG tablet Take 60 mg by mouth daily.     11/12/2014 at Unknown time   Scheduled: .  stroke: mapping our early stages of recovery book   Does not apply Once  . atorvastatin  20 mg Oral q1800  . cefTRIAXone (ROCEPHIN)  IV  1 g Intravenous Q24H  . diltiazem  360 mg Oral Daily  . donepezil  5 mg Oral QHS  . enoxaparin (LOVENOX) injection  40 mg Subcutaneous Q24H  . escitalopram  10 mg Oral Daily  . ferrous sulfate  325 mg Oral BID WC  . hydrochlorothiazide  12.5 mg Oral Daily  . levothyroxine  100 mcg Oral QAC breakfast  . losartan  100 mg Oral Daily  . metoprolol succinate  25 mg Oral QPM  . oxybutynin  5 mg Oral Daily  . pantoprazole  40 mg Oral Daily  . phenytoin  125 mg Oral q morning - 10a  . polyethylene glycol  17 g Oral Daily  . primidone  200 mg Oral TID  . raloxifene  60 mg  Oral Daily   Continuous: . sodium chloride 50 mL/hr at 11/12/14 2345   PRN:  Assesment: She has urinary tract infection. She had slurred speech and difficulty with balance which may be related to the urinary tract infection but there is concern that she may have had a stroke.  She has seizure disorder but has not had any seizures that have been witnessed Active Problems:   UTI (lower urinary tract infection)   Dysarthria   Hypertension   Seizure disorder ([place])    Plan: MRI today. Continue other treatments. Neurology  consultation    LOS: 1 day   Harleigh Civello L 11/13/2014, 8:45 AM

## 2014-11-13 NOTE — Care Management Note (Signed)
Case Management Note  Patient Details  Name: Brittney Reynolds MRN: [id number] Date of Birth: Aug 16, 1933  Subjective/Objective:                  Pt admitted from home with UTI/altered mental status. Pt lives with her sister and will return home at discharge. Pt also has 24 hour caregivers in the home. Pt has a cane, walker, and w/c for home use.  Action/Plan: Will continue to follow for discharge planning needs. ? Need for [facility] services at discharge.  Expected Discharge Date:                  Expected Discharge Plan:  Home/Self Care  In-House Referral:  NA  Discharge planning Services  CM Consult  Post Acute Care Choice:  NA Choice offered to:  NA  DME Arranged:    DME Agency:     HH Arranged:    HH Agency:     Status of Service:  In process, will continue to follow  Medicare Important Message Given:    Date Medicare IM Given:    Medicare IM give by:    Date Additional Medicare IM Given:    Additional Medicare Important Message give by:     If discussed at [place] of Stay Meetings, dates discussed:    Additional Comments:  Joylene Draft, RN 11/13/2014, 3:11 PM

## 2014-11-13 NOTE — Evaluation (Signed)
Occupational Therapy Evaluation Patient Details Name: Brittney Reynolds MRN: [id number] DOB: [birth year] Today's Date: 11/13/2014    History of Present Illness This is a 81 year old lady who gives a 2 day history of slurred speech. There is no vision problems or change in balance/mobility. She normally is in a wheelchair. She has a previous history of polio. She denies any nausea, vomiting, abdominal pain, dysuria. She also complained of back pain today. Evaluation in the emergency room shows it to have a UTI, CT brain scan is not showing any acute findings. There is concern over a stroke and she is now being admitted for further investigation and management.   Clinical Impression   Pt awake, alert, and oriented x3 this am. Pt is at functional baseline with ADL tasks. Pt lives with sister and has caregivers 24/7 who assist with all ADL tasks. No further OT services required.     Follow Up Recommendations  No OT follow up    Equipment Recommendations  None recommended by OT       Precautions / Restrictions Precautions Precautions: Fall Restrictions Weight Bearing Restrictions: No      Mobility Bed Mobility Overal bed mobility: Needs Assistance Bed Mobility: Supine to Sit     Supine to sit: Supervision;HOB elevated        Transfers Overall transfer level: Needs assistance Equipment used: Standard walker Transfers: Sit to/from Stand Sit to Stand: Supervision              Balance Overall balance assessment: No apparent balance deficits (not formally assessed)                                          ADL Overall ADL's : Needs assistance/impaired;At baseline                                       General ADL Comments: Pt lives with sister and has caregivers 24/7 who assist with all ADL and housekeeping tasks.      Vision Vision Assessment?: No apparent visual deficits          Pertinent Vitals/Pain Pain Assessment:  No/denies pain Pain Score: 3  Pain Location: left hip Pain Descriptors / Indicators: Aching Pain Intervention(s): Limited activity within patient's tolerance;Monitored during session     Hand Dominance Right   Extremity/Trunk Assessment Upper Extremity Assessment Upper Extremity Assessment: Defer to OT evaluation LUE Coordination: decreased gross motor;decreased fine motor (at baseline)   Lower Extremity Assessment Lower Extremity Assessment: LLE deficits/detail LLE Deficits / Details: mild weakness LLE as compared to the RLE due to old polio       Communication Communication Communication:  (mild slurring of speech)   Cognition Arousal/Alertness: Awake/alert Behavior During Therapy: WFL for tasks assessed/performed Overall Cognitive Status: Within Functional Limits for tasks assessed       Memory: Decreased short-term memory                        Home Living Family/patient expects to be discharged to:: Private residence Living Arrangements: Other relatives Available Help at Discharge: Personal care attendant;Available 24 hours/day Type of Home: House             Bathroom Shower/Tub: Teacher, early years/pre: Standard     Home Equipment:  Wheelchair - [employer];Shower seat          Prior Functioning/Environment Level of Independence: Needs assistance  Gait / Transfers Assistance Needed: pt able to transfer with SBA, uses a w/c independently ADL's / Homemaking Assistance Needed: Pt requires assistance for all ADL tasks, aide is present 24/7 to assist pt and her sister         End of Session    Activity Tolerance: Patient tolerated treatment well Patient left: in bed;with call bell/phone within reach;with bed alarm set;with nursing/sitter in room   Time: 0820-0853 OT Time Calculation (min): 33 min Charges:  OT General Charges $OT Visit: 1 Procedure OT Evaluation $Initial OT Evaluation Tier I: 1 Procedure  Guadelupe Sabin, OTR/L  [phone]  11/13/2014, 11:32 AM

## 2014-11-14 DIAGNOSIS — G9389 Other specified disorders of brain: Secondary | ICD-10-CM

## 2014-11-14 DIAGNOSIS — G9341 Metabolic encephalopathy: Secondary | ICD-10-CM | POA: Diagnosis present

## 2014-11-14 LAB — PHENOBARBITAL LEVEL: PHENOBARBITAL: 40.7 ug/mL — AB (ref 15.0–40.0)

## 2014-11-14 LAB — HEMOGLOBIN A1C
HEMOGLOBIN A1C: 5.6 % (ref 4.8–5.6)
MEAN PLASMA GLUCOSE: 114 mg/dL

## 2014-11-14 MED ORDER — CEFUROXIME AXETIL 250 MG PO TABS: 250.0000 mg | ORAL_TABLET | Freq: Two times a day (BID) | ORAL | Status: DC

## 2014-11-14 NOTE — Discharge Summary (Signed)
Physician Discharge Summary  Patient ID: ANETHA KORST MRN: [id number] DOB/AGE: Jul 03, 1933 81 y.o. Primary Care Physician:Chanc Kervin L, MD Admit date: 11/12/2014 Discharge date: 11/14/2014    Discharge Diagnoses:   Active Problems:   UTI (lower urinary tract infection)   Dysarthria   Hypertension   Seizure disorder ([place])   Encephalomalacia on imaging study   Encephalopathy, metabolic     Medication List    TAKE these medications        CALCIUM + D PO  Take 600 mg by mouth 2 (two) times daily.     cefUROXime 250 MG tablet  Commonly known as:  CEFTIN  Take 1 tablet (250 mg total) by mouth 2 (two) times daily with a meal.     DILANTIN 125 MG/5ML suspension  Generic drug:  phenytoin  Take 125 mg by mouth every morning.     diltiazem 360 MG 24 hr capsule  Commonly known as:  TIAZAC  Take 360 mg by mouth daily.     donepezil 5 MG tablet  Commonly known as:  ARICEPT  Take 5 mg by mouth at bedtime.     escitalopram 10 MG tablet  Commonly known as:  LEXAPRO  Take 10 mg by mouth daily.     ferrous sulfate 325 (65 FE) MG tablet  Take 325 mg by mouth 2 (two) times daily with a meal.     levothyroxine 100 MCG tablet  Commonly known as:  SYNTHROID, LEVOTHROID  Take 100 mcg by mouth daily before breakfast.     losartan-hydrochlorothiazide 100-12.5 MG tablet  Commonly known as:  HYZAAR  Take 1 tablet by mouth daily.     metoprolol succinate 25 MG 24 hr tablet  Commonly known as:  TOPROL-XL  Take 25 mg by mouth every evening.     oxybutynin 5 MG 24 hr tablet  Commonly known as:  DITROPAN-XL  Take 5 mg by mouth daily.     pantoprazole 40 MG tablet  Commonly known as:  PROTONIX  Take 40 mg by mouth daily.     polyethylene glycol powder powder  Commonly known as:  GLYCOLAX/MIRALAX  Take 17 g by mouth daily.     primidone 50 MG tablet  Commonly known as:  MYSOLINE  Take 200 mg by mouth 3 (three) times daily.     raloxifene 60 MG tablet  Commonly  known as:  EVISTA  Take 60 mg by mouth daily.        Discharged Condition: Improved    Consults: None  Significant Diagnostic Studies: Dg Chest 2 View  11/12/2014  CLINICAL DATA:  Confusion and slurred speech.  Chronic cough EXAM: CHEST  2 VIEW COMPARISON:  October 26, 2012 FINDINGS: There is mild bibasilar atelectatic type change. There is no frank edema or consolidation. The heart size and pulmonary vascularity are normal. There is a hiatal hernia present. No bone lesions. IMPRESSION: Mild bibasilar atelectasis. No edema or consolidation. Hiatal hernia present, unchanged. Heart size within normal limits. Electronically Signed   By: Lowella Grip III M.D.   On: 11/12/2014 19:47   Ct Head Wo Contrast  11/12/2014  CLINICAL DATA:  Confusion and slurred speech for 1 day EXAM: CT HEAD WITHOUT CONTRAST TECHNIQUE: Contiguous axial images were obtained from the base of the skull through the vertex without intravenous contrast. COMPARISON:  June 23, 2010 FINDINGS: There is marked cerebellar atrophy, a stable finding. The ventricles are normal in size and configuration. There is no intracranial mass, hemorrhage, extra-axial fluid  collection, or midline shift. There is a stable prior right temporal and anterior parietal infarct with encephalomalacia on the right. Elsewhere there is slight periventricular small vessel disease. There is no new gray-white compartment lesion. No acute infarct. Bony calvarium appears intact. The mastoid air cells are clear. No intraorbital lesions are appreciable on this study. 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 Slight periventricular small vessel disease. No hemorrhage, mass, or acute appearing infarct. Advanced cerebellar atrophy is stable. Electronically Signed   By: Lowella Grip III M.D.   On: 11/12/2014 19:53   Mr Jodene Nam Head Wo Contrast  11/13/2014  CLINICAL DATA:  LEFT-sided weakness and confusion for 2  days. EXAM: MRI HEAD WITHOUT CONTRAST MRA HEAD WITHOUT CONTRAST TECHNIQUE: Multiplanar, multiecho pulse sequences of the brain and surrounding structures were obtained without intravenous contrast. Angiographic images of the head were obtained using MRA technique without contrast. COMPARISON:  CT head 11/12/2014, also CT head 04/21/2008. FINDINGS: MRI HEAD FINDINGS The patient was unable to remain motionless for the exam. Small or subtle lesions could be overlooked. Marked cerebellar atrophy, out of proportion to the degree of cerebral atrophy. This could be a central (bulbar) manifestation of poliomyelitis. Remote ischemic event (versus congenital brain substance loss) RIGHT hemisphere affecting the frontal, temporal, and parietal lobes stable from 2010. Hypertrophic change of the adjacent calvarium indicates longstanding process. No restricted diffusion, acute hemorrhage, mass lesion, hydrocephalus, or extra-axial fluid collection. Minor white matter disease, nonspecific. Mild gliosis along the margin of the RIGHT hemisphere cerebral volume loss. Flow voids are maintained in the carotid, basilar, and LEFT vertebral arteries; RIGHT vertebral appears to terminate in PICA. Pituitary and cerebellar tonsils unremarkable. No acute sinus or mastoid disease. BILATERAL cataract extraction. MRA HEAD FINDINGS Motion degraded exam.  Images are marginally diagnostic. Gross patency of the internal carotid arteries and basilar artery is established. The LEFT vertebral is dominant with no definite flow reducing stenosis. Gross patency of the anterior, middle, and posterior cerebral arteries is noted. There is a fetal origin to the RIGHT PCA. No large intracranial aneurysm is observed. IMPRESSION: Significantly motion degraded scan. Marked cerebellar atrophy out of proportion to the degree of cerebral atrophy, favor remote manifestation of poliomyelitis. Remote ischemic event versus congenital brain substance loss RIGHT  hemisphere, also stable from 2010. No acute stroke is evident. No large vessel occlusion is demonstrated. Electronically Signed   By: Staci Righter M.D.   On: 11/13/2014 08:33   Mr Brain Wo Contrast  11/13/2014  CLINICAL DATA:  LEFT-sided weakness and confusion for 2 days. EXAM: MRI HEAD WITHOUT CONTRAST MRA HEAD WITHOUT CONTRAST TECHNIQUE: Multiplanar, multiecho pulse sequences of the brain and surrounding structures were obtained without intravenous contrast. Angiographic images of the head were obtained using MRA technique without contrast. COMPARISON:  CT head 11/12/2014, also CT head 04/21/2008. FINDINGS: MRI HEAD FINDINGS The patient was unable to remain motionless for the exam. Small or subtle lesions could be overlooked. Marked cerebellar atrophy, out of proportion to the degree of cerebral atrophy. This could be a central (bulbar) manifestation of poliomyelitis. Remote ischemic event (versus congenital brain substance loss) RIGHT hemisphere affecting the frontal, temporal, and parietal lobes stable from 2010. Hypertrophic change of the adjacent calvarium indicates longstanding process. No restricted diffusion, acute hemorrhage, mass lesion, hydrocephalus, or extra-axial fluid collection. Minor white matter disease, nonspecific. Mild gliosis along the margin of the RIGHT hemisphere cerebral volume loss. Flow voids are maintained in the carotid, basilar, and LEFT vertebral arteries; RIGHT  vertebral appears to terminate in PICA. Pituitary and cerebellar tonsils unremarkable. No acute sinus or mastoid disease. BILATERAL cataract extraction. MRA HEAD FINDINGS Motion degraded exam.  Images are marginally diagnostic. Gross patency of the internal carotid arteries and basilar artery is established. The LEFT vertebral is dominant with no definite flow reducing stenosis. Gross patency of the anterior, middle, and posterior cerebral arteries is noted. There is a fetal origin to the RIGHT PCA. No large intracranial  aneurysm is observed. IMPRESSION: Significantly motion degraded scan. Marked cerebellar atrophy out of proportion to the degree of cerebral atrophy, favor remote manifestation of poliomyelitis. Remote ischemic event versus congenital brain substance loss RIGHT hemisphere, also stable from 2010. No acute stroke is evident. No large vessel occlusion is demonstrated. Electronically Signed   By: Staci Righter M.D.   On: 11/13/2014 08:33    Lab Results: Basic Metabolic Panel:  Recent Labs  11/12/14 1823  NA 137  K 3.9  CL 97*  CO2 31  GLUCOSE 81  BUN 24*  CREATININE 1.01*  CALCIUM 9.6   Liver Function Tests:  Recent Labs  11/12/14 1823  AST 24  ALT 15  ALKPHOS 60  BILITOT 0.4  PROT 7.6  ALBUMIN 4.2     CBC:  Recent Labs  11/12/14 1823  WBC 5.5  HGB 12.8  HCT 37.8  MCV 94.7  PLT 192    No results found for this or any previous visit (from the past 240 hour(s)).   Hospital Course: This is a 81 year old who had about a 2 day history of dysarthria and increased problems with balance. At baseline she has severe trouble with balance. There was concern that she might have had a stroke but she was also urinating frequently. When she came to the emergency department she had altered mental status and had positive urinalysis. CT did not show a stroke. MRI was ordered and also did not show a stroke. She improved remarkably with the use of intravenous antibiotics and was ready for discharge and back at baseline.  Discharge Exam: Blood pressure 144/65, pulse 71, temperature 97.1 F (36.2 C), temperature source Oral, resp. rate 20, height 5\' 6"  (1.676 m), weight 81.7 kg (180 lb 1.9 oz), SpO2 98 %. She is awake and alert. She is off balance at baseline. Her chest is clear.  Disposition: Home on Ceftin. Her culture is pending and this may need to be modified      Discharge Instructions    Discharge patient    Complete by:  As directed              Signed: Shawnelle Spoerl  L   11/14/2014, 8:49 AM

## 2014-11-14 NOTE — Care Management Note (Signed)
Case Management Note  Patient Details  Name: Brittney Reynolds MRN: [id number] Date of Birth: 15-Mar-1933  Subjective/Objective:                    Action/Plan:   Expected Discharge Date:                  Expected Discharge Plan:  Home/Self Care  In-House Referral:  NA  Discharge planning Services  CM Consult  Post Acute Care Choice:  NA Choice offered to:  NA  DME Arranged:    DME Agency:     HH Arranged:    [place] Agency:     Status of Service:  Completed, signed off  Medicare Important Message Given:  N/A - LOS <3 / Initial given by admissions Date Medicare IM Given:    Medicare IM give by:    Date Additional Medicare IM Given:    Additional Medicare Important Message give by:     If discussed at [place] of Stay Meetings, dates discussed:    Additional Comments: Pt discharged home today. No CM needs noted. Christinia Gully Trent, RN 11/14/2014, 9:21 AM

## 2014-11-14 NOTE — Progress Notes (Signed)
Subjective: She says she feels much better. She did not have a stroke on MRI. I think this is related to urinary tract infection plus her chronic bowel and skin disorder which is probably related to polio in childhood  Objective: Vital signs in last 24 hours: Temp:  [97.1 F (36.2 C)-98.5 F (36.9 C)] 97.1 F (36.2 C) (10/28 0453) Pulse Rate:  [60-81] 71 (10/28 0453) Resp:  [16-20] 20 (10/28 0453) BP: (101-145)/(50-72) 144/65 mmHg (10/28 0453) SpO2:  [95 %-100 %] 98 % (10/28 0453) Weight change:  Last BM Date: 11/12/14  Intake/Output from previous day: 10/27 0701 - 10/28 0700 In: 1989.2 [P.O.:720; I.V.:1219.2; IV Piggyback:50] Out: -   PHYSICAL EXAM General appearance: alert, cooperative and no distress Resp: clear to auscultation bilaterally Cardio: regular rate and rhythm, S1, S2 normal, no murmur, click, rub or gallop GI: soft, non-tender; bowel sounds normal; no masses,  no organomegaly Extremities: extremities normal, atraumatic, no cyanosis or edema  Lab Results:  Results for orders placed or performed during the hospital encounter of 11/12/14 (from the past 48 hour(s))  Comprehensive metabolic panel     Status: Abnormal   Collection Time: 11/12/14  6:23 PM  Result Value Ref Range   Sodium 137 135 - 145 mmol/L   Potassium 3.9 3.5 - 5.1 mmol/L   Chloride 97 (L) 101 - 111 mmol/L   CO2 31 22 - 32 mmol/L   Glucose, Bld 81 65 - 99 mg/dL   BUN 24 (H) 6 - 20 mg/dL   Creatinine, Ser 1.01 (H) 0.44 - 1.00 mg/dL   Calcium 9.6 8.9 - 10.3 mg/dL   Total Protein 7.6 6.5 - 8.1 g/dL   Albumin 4.2 3.5 - 5.0 g/dL   AST 24 15 - 41 U/L   ALT 15 14 - 54 U/L   Alkaline Phosphatase 60 38 - 126 U/L   Total Bilirubin 0.4 0.3 - 1.2 mg/dL   GFR calc non Af Amer 52 (L) >60 mL/min   GFR calc Af Amer 60 (L) >60 mL/min    Comment: (NOTE) The eGFR has been calculated using the CKD EPI equation. This calculation has not been validated in all clinical situations. eGFR's persistently <60  mL/min signify possible Chronic Kidney Disease.    Anion gap 9 5 - 15  CBC     Status: None   Collection Time: 11/12/14  6:23 PM  Result Value Ref Range   WBC 5.5 4.0 - 10.5 K/uL   RBC 3.99 3.87 - 5.11 MIL/uL   Hemoglobin 12.8 12.0 - 15.0 g/dL   HCT 37.8 36.0 - 46.0 %   MCV 94.7 78.0 - 100.0 fL   MCH 32.1 26.0 - 34.0 pg   MCHC 33.9 30.0 - 36.0 g/dL   RDW 12.7 11.5 - 15.5 %   Platelets 192 150 - 400 K/uL  Phenytoin level, total     Status: Abnormal   Collection Time: 11/12/14  6:23 PM  Result Value Ref Range   Phenytoin Lvl 8.8 (L) 10.0 - 20.0 ug/mL  Hemoglobin A1c     Status: None   Collection Time: 11/12/14  6:23 PM  Result Value Ref Range   Hgb A1c MFr Bld 5.6 4.8 - 5.6 %    Comment: (NOTE)         Pre-diabetes: 5.7 - 6.4         Diabetes: >6.4         Glycemic control for adults with diabetes: <7.0    Mean Plasma Glucose  114 mg/dL    Comment: (NOTE) Performed At: [facility] [place], [name] [id number] Lindon Romp MD [id number]   Urinalysis, Routine w reflex microscopic (not at [facility])     Status: Abnormal   Collection Time: 11/12/14  7:10 PM  Result Value Ref Range   Color, Urine YELLOW YELLOW   APPearance CLEAR CLEAR   Specific Gravity, Urine 1.015 1.005 - 1.030   pH 6.5 5.0 - 8.0   Glucose, UA NEGATIVE NEGATIVE mg/dL   Hgb urine dipstick NEGATIVE NEGATIVE   Bilirubin Urine NEGATIVE NEGATIVE   Ketones, ur NEGATIVE NEGATIVE mg/dL   Protein, ur NEGATIVE NEGATIVE mg/dL   Urobilinogen, UA 0.2 0.0 - 1.0 mg/dL   Nitrite POSITIVE (A) NEGATIVE   Leukocytes, UA TRACE (A) NEGATIVE  Urine microscopic-add on     Status: Abnormal   Collection Time: 11/12/14  7:10 PM  Result Value Ref Range   Squamous Epithelial / LPF MANY (A) RARE   WBC, UA 3-6 <3 WBC/hpf   RBC / HPF 0-2 <3 RBC/hpf   Bacteria, UA MANY (A) RARE  Lipid panel     Status: Abnormal   Collection Time: 11/13/14  5:39 AM  Result Value Ref Range   Cholesterol 184 0 - 200 mg/dL    Triglycerides 43 <150 mg/dL   HDL 55 >40 mg/dL   Total CHOL/HDL Ratio 3.3 RATIO   VLDL 9 0 - 40 mg/dL   LDL Cholesterol 120 (H) 0 - 99 mg/dL    Comment:        Total Cholesterol/HDL:CHD Risk Coronary Heart Disease Risk Table                     Men   Women  1/2 Average Risk   3.4   3.3  Average Risk       5.0   4.4  2 X Average Risk   9.6   7.1  3 X Average Risk  23.4   11.0        Use the calculated Patient Ratio above and the CHD Risk Table to determine the patient's CHD Risk.        ATP III CLASSIFICATION (LDL):  <100     mg/dL   Optimal  100-129  mg/dL   Near or Above                    Optimal  130-159  mg/dL   Borderline  160-189  mg/dL   High  >190     mg/dL   Very High     ABGS No results for input(s): PHART, PO2ART, TCO2, HCO3 in the last 72 hours.  Invalid input(s): PCO2 CULTURES No results found for this or any previous visit (from the past 240 hour(s)). Studies/Results: Dg Chest 2 View  11/12/2014  CLINICAL DATA:  Confusion and slurred speech.  Chronic cough EXAM: CHEST  2 VIEW COMPARISON:  October 26, 2012 FINDINGS: There is mild bibasilar atelectatic type change. There is no frank edema or consolidation. The heart size and pulmonary vascularity are normal. There is a hiatal hernia present. No bone lesions. IMPRESSION: Mild bibasilar atelectasis. No edema or consolidation. Hiatal hernia present, unchanged. Heart size within normal limits. Electronically Signed   By: Lowella Grip III M.D.   On: 11/12/2014 19:47   Ct Head Wo Contrast  11/12/2014  CLINICAL DATA:  Confusion and slurred speech for 1 day EXAM: CT HEAD WITHOUT CONTRAST TECHNIQUE: Contiguous axial images  were obtained from the base of the skull through the vertex without intravenous contrast. COMPARISON:  June 23, 2010 FINDINGS: There is marked cerebellar atrophy, a stable finding. The ventricles are normal in size and configuration. There is no intracranial mass, hemorrhage, extra-axial fluid  collection, or midline shift. There is a stable prior right temporal and anterior parietal infarct with encephalomalacia on the right. Elsewhere there is slight periventricular small vessel disease. There is no new gray-white compartment lesion. No acute infarct. Bony calvarium appears intact. The mastoid air cells are clear. No intraorbital lesions are appreciable on this study. 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 Slight periventricular small vessel disease. No hemorrhage, mass, or acute appearing infarct. Advanced cerebellar atrophy is stable. Electronically Signed   By: Lowella Grip III M.D.   On: 11/12/2014 19:53   Mr Jodene Nam Head Wo Contrast  11/13/2014  CLINICAL DATA:  LEFT-sided weakness and confusion for 2 days. EXAM: MRI HEAD WITHOUT CONTRAST MRA HEAD WITHOUT CONTRAST TECHNIQUE: Multiplanar, multiecho pulse sequences of the brain and surrounding structures were obtained without intravenous contrast. Angiographic images of the head were obtained using MRA technique without contrast. COMPARISON:  CT head 11/12/2014, also CT head 04/21/2008. FINDINGS: MRI HEAD FINDINGS The patient was unable to remain motionless for the exam. Small or subtle lesions could be overlooked. Marked cerebellar atrophy, out of proportion to the degree of cerebral atrophy. This could be a central (bulbar) manifestation of poliomyelitis. Remote ischemic event (versus congenital brain substance loss) RIGHT hemisphere affecting the frontal, temporal, and parietal lobes stable from 2010. Hypertrophic change of the adjacent calvarium indicates longstanding process. No restricted diffusion, acute hemorrhage, mass lesion, hydrocephalus, or extra-axial fluid collection. Minor white matter disease, nonspecific. Mild gliosis along the margin of the RIGHT hemisphere cerebral volume loss. Flow voids are maintained in the carotid, basilar, and LEFT vertebral arteries; RIGHT vertebral  appears to terminate in PICA. Pituitary and cerebellar tonsils unremarkable. No acute sinus or mastoid disease. BILATERAL cataract extraction. MRA HEAD FINDINGS Motion degraded exam.  Images are marginally diagnostic. Gross patency of the internal carotid arteries and basilar artery is established. The LEFT vertebral is dominant with no definite flow reducing stenosis. Gross patency of the anterior, middle, and posterior cerebral arteries is noted. There is a fetal origin to the RIGHT PCA. No large intracranial aneurysm is observed. IMPRESSION: Significantly motion degraded scan. Marked cerebellar atrophy out of proportion to the degree of cerebral atrophy, favor remote manifestation of poliomyelitis. Remote ischemic event versus congenital brain substance loss RIGHT hemisphere, also stable from 2010. No acute stroke is evident. No large vessel occlusion is demonstrated. Electronically Signed   By: Staci Righter M.D.   On: 11/13/2014 08:33   Mr Brain Wo Contrast  11/13/2014  CLINICAL DATA:  LEFT-sided weakness and confusion for 2 days. EXAM: MRI HEAD WITHOUT CONTRAST MRA HEAD WITHOUT CONTRAST TECHNIQUE: Multiplanar, multiecho pulse sequences of the brain and surrounding structures were obtained without intravenous contrast. Angiographic images of the head were obtained using MRA technique without contrast. COMPARISON:  CT head 11/12/2014, also CT head 04/21/2008. FINDINGS: MRI HEAD FINDINGS The patient was unable to remain motionless for the exam. Small or subtle lesions could be overlooked. Marked cerebellar atrophy, out of proportion to the degree of cerebral atrophy. This could be a central (bulbar) manifestation of poliomyelitis. Remote ischemic event (versus congenital brain substance loss) RIGHT hemisphere affecting the frontal, temporal, and parietal lobes stable from 2010. Hypertrophic change of the adjacent calvarium indicates  longstanding process. No restricted diffusion, acute hemorrhage, mass lesion,  hydrocephalus, or extra-axial fluid collection. Minor white matter disease, nonspecific. Mild gliosis along the margin of the RIGHT hemisphere cerebral volume loss. Flow voids are maintained in the carotid, basilar, and LEFT vertebral arteries; RIGHT vertebral appears to terminate in PICA. Pituitary and cerebellar tonsils unremarkable. No acute sinus or mastoid disease. BILATERAL cataract extraction. MRA HEAD FINDINGS Motion degraded exam.  Images are marginally diagnostic. Gross patency of the internal carotid arteries and basilar artery is established. The LEFT vertebral is dominant with no definite flow reducing stenosis. Gross patency of the anterior, middle, and posterior cerebral arteries is noted. There is a fetal origin to the RIGHT PCA. No large intracranial aneurysm is observed. IMPRESSION: Significantly motion degraded scan. Marked cerebellar atrophy out of proportion to the degree of cerebral atrophy, favor remote manifestation of poliomyelitis. Remote ischemic event versus congenital brain substance loss RIGHT hemisphere, also stable from 2010. No acute stroke is evident. No large vessel occlusion is demonstrated. Electronically Signed   By: Staci Righter M.D.   On: 11/13/2014 08:33    Medications:  Prior to Admission:  Prescriptions prior to admission  Medication Sig Dispense Refill Last Dose  . Calcium Carbonate-Vitamin D (CALCIUM + D PO) Take 600 mg by mouth 2 (two) times daily.    11/12/2014 at Unknown time  . diltiazem (TIAZAC) 360 MG 24 hr capsule Take 360 mg by mouth daily.   11/12/2014 at Unknown time  . donepezil (ARICEPT) 5 MG tablet Take 5 mg by mouth at bedtime.   11/11/2014 at Unknown time  . escitalopram (LEXAPRO) 10 MG tablet Take 10 mg by mouth daily.     11/12/2014 at Unknown time  . ferrous sulfate 325 (65 FE) MG tablet Take 325 mg by mouth 2 (two) times daily with a meal.   11/12/2014 at Unknown time  . levothyroxine (SYNTHROID, LEVOTHROID) 100 MCG tablet Take 100 mcg by  mouth daily before breakfast.   11/12/2014 at Unknown time  . losartan-hydrochlorothiazide (HYZAAR) 100-12.5 MG per tablet Take 1 tablet by mouth daily.   11/12/2014 at Unknown time  . metoprolol succinate (TOPROL-XL) 25 MG 24 hr tablet Take 25 mg by mouth every evening.    11/11/2014 at 2000  . oxybutynin (DITROPAN-XL) 5 MG 24 hr tablet Take 5 mg by mouth daily.   11/12/2014 at Unknown time  . pantoprazole (PROTONIX) 40 MG tablet Take 40 mg by mouth daily.   11/12/2014 at Unknown time  . phenytoin (DILANTIN) 125 MG/5ML suspension Take 125 mg by mouth every morning.   11/12/2014 at [place]  . polyethylene glycol powder (GLYCOLAX/MIRALAX) powder Take 17 g by mouth daily.   11/12/2014 at Unknown time  . primidone (MYSOLINE) 50 MG tablet Take 200 mg by mouth 3 (three) times daily.   11/12/2014 at Unknown time  . raloxifene (EVISTA) 60 MG tablet Take 60 mg by mouth daily.     11/12/2014 at Unknown time   Scheduled: .  stroke: mapping our early stages of recovery book   Does not apply Once  . atorvastatin  20 mg Oral q1800  . cefTRIAXone (ROCEPHIN)  IV  1 g Intravenous Q24H  . diltiazem  360 mg Oral Daily  . donepezil  5 mg Oral QHS  . enoxaparin (LOVENOX) injection  40 mg Subcutaneous Q24H  . escitalopram  10 mg Oral Daily  . ferrous sulfate  325 mg Oral BID WC  . hydrochlorothiazide  12.5 mg Oral Daily  .  levothyroxine  100 mcg Oral QAC breakfast  . losartan  100 mg Oral Daily  . metoprolol succinate  25 mg Oral QPM  . oxybutynin  5 mg Oral Daily  . pantoprazole  40 mg Oral Daily  . phenytoin  125 mg Oral q morning - 10a  . polyethylene glycol  17 g Oral Daily  . primidone  200 mg Oral TID  . raloxifene  60 mg Oral Daily   Continuous: . sodium chloride 50 mL/hr at 11/14/14 0059   PRN:  Assesment: She was admitted with urinary tract infection and had significant issues with altered mental status etc. She is much improved. She is ready for discharge Active Problems:   UTI (lower urinary  tract infection)   Dysarthria   Hypertension   Seizure disorder ([place])    Plan: Discharge home. She has responded to IV Rocephin so I'll send her home on a cephalosporin. Culture is still pending    LOS: 2 days   Abdulkarim Eberlin L 11/14/2014, 8:45 AM

## 2014-11-14 NOTE — Care Management Important Message (Signed)
Important Message  Patient Details  Name: Brittney Reynolds MRN: [id number] Date of Birth: 1934/01/13   Medicare Important Message Given:  N/A - LOS <3 / Initial given by admissions    Joylene Draft, RN 11/14/2014, 9:16 AM

## 2014-11-15 LAB — URINE CULTURE

## 2015-02-28 ENCOUNTER — Emergency Department (HOSPITAL_COMMUNITY): Payer: Medicare Other

## 2015-02-28 ENCOUNTER — Emergency Department (HOSPITAL_COMMUNITY)
Admission: EM | Admit: 2015-02-28 | Discharge: 2015-02-28 | Disposition: A | Discharge status: Home / Self Care | Payer: Medicare Other | Attending: Emergency Medicine | Admitting: Emergency Medicine

## 2015-02-28 ENCOUNTER — Encounter (HOSPITAL_COMMUNITY): Payer: Self-pay | Admitting: Emergency Medicine

## 2015-02-28 DIAGNOSIS — F329 Major depressive disorder, single episode, unspecified: Secondary | ICD-10-CM | POA: Diagnosis not present

## 2015-02-28 DIAGNOSIS — F0390 Unspecified dementia without behavioral disturbance: Secondary | ICD-10-CM | POA: Insufficient documentation

## 2015-02-28 DIAGNOSIS — Z8612 Personal history of poliomyelitis: Secondary | ICD-10-CM | POA: Diagnosis not present

## 2015-02-28 DIAGNOSIS — S3992XA Unspecified injury of lower back, initial encounter: Secondary | ICD-10-CM | POA: Insufficient documentation

## 2015-02-28 DIAGNOSIS — M546 Pain in thoracic spine: Secondary | ICD-10-CM | POA: Diagnosis not present

## 2015-02-28 DIAGNOSIS — W19XXXA Unspecified fall, initial encounter: Secondary | ICD-10-CM

## 2015-02-28 DIAGNOSIS — N390 Urinary tract infection, site not specified: Secondary | ICD-10-CM | POA: Diagnosis not present

## 2015-02-28 DIAGNOSIS — Z86010 Personal history of colonic polyps: Secondary | ICD-10-CM | POA: Diagnosis not present

## 2015-02-28 DIAGNOSIS — M549 Dorsalgia, unspecified: Secondary | ICD-10-CM | POA: Diagnosis not present

## 2015-02-28 DIAGNOSIS — Y9289 Other specified places as the place of occurrence of the external cause: Secondary | ICD-10-CM | POA: Diagnosis not present

## 2015-02-28 DIAGNOSIS — X58XXXA Exposure to other specified factors, initial encounter: Secondary | ICD-10-CM | POA: Insufficient documentation

## 2015-02-28 DIAGNOSIS — I10 Essential (primary) hypertension: Secondary | ICD-10-CM | POA: Insufficient documentation

## 2015-02-28 DIAGNOSIS — Y9389 Activity, other specified: Secondary | ICD-10-CM | POA: Insufficient documentation

## 2015-02-28 DIAGNOSIS — Y998 Other external cause status: Secondary | ICD-10-CM | POA: Insufficient documentation

## 2015-02-28 DIAGNOSIS — Z79899 Other long term (current) drug therapy: Secondary | ICD-10-CM | POA: Insufficient documentation

## 2015-02-28 DIAGNOSIS — Z79810 Long term (current) use of selective estrogen receptor modulators (SERMs): Secondary | ICD-10-CM | POA: Diagnosis not present

## 2015-02-28 DIAGNOSIS — M545 Low back pain: Secondary | ICD-10-CM | POA: Diagnosis not present

## 2015-02-28 DIAGNOSIS — D649 Anemia, unspecified: Secondary | ICD-10-CM | POA: Diagnosis not present

## 2015-02-28 DIAGNOSIS — M79604 Pain in right leg: Secondary | ICD-10-CM | POA: Diagnosis not present

## 2015-02-28 DIAGNOSIS — F419 Anxiety disorder, unspecified: Secondary | ICD-10-CM | POA: Diagnosis not present

## 2015-02-28 DIAGNOSIS — M25561 Pain in right knee: Secondary | ICD-10-CM | POA: Diagnosis not present

## 2015-02-28 DIAGNOSIS — T148 Other injury of unspecified body region: Secondary | ICD-10-CM | POA: Diagnosis not present

## 2015-02-28 LAB — BASIC METABOLIC PANEL
Anion gap: 9 (ref 5–15)
BUN: 22 mg/dL — AB (ref 6–20)
CALCIUM: 9.1 mg/dL (ref 8.9–10.3)
CO2: 32 mmol/L (ref 22–32)
Chloride: 99 mmol/L — ABNORMAL LOW (ref 101–111)
Creatinine, Ser: 1.03 mg/dL — ABNORMAL HIGH (ref 0.44–1.00)
GFR calc Af Amer: 58 mL/min — ABNORMAL LOW (ref >60)
GFR calc non Af Amer: 50 mL/min — ABNORMAL LOW (ref >60)
GLUCOSE: 59 mg/dL — AB (ref 65–99)
Potassium: 3.4 mmol/L — ABNORMAL LOW (ref 3.5–5.1)
Sodium: 140 mmol/L (ref 135–145)

## 2015-02-28 LAB — URINE MICROSCOPIC-ADD ON

## 2015-02-28 LAB — CBC WITH DIFFERENTIAL/PLATELET
BASOS PCT: 0 %
Basophils Absolute: 0 10*3/uL (ref 0.0–0.1)
EOS ABS: 0.1 10*3/uL (ref 0.0–0.7)
EOS PCT: 1 %
HEMATOCRIT: 39 % (ref 36.0–46.0)
Hemoglobin: 13 g/dL (ref 12.0–15.0)
Lymphocytes Relative: 39 %
Lymphs Abs: 2.9 10*3/uL (ref 0.7–4.0)
MCH: 31.6 pg (ref 26.0–34.0)
MCHC: 33.3 g/dL (ref 30.0–36.0)
MCV: 94.9 fL (ref 78.0–100.0)
MONO ABS: 1 10*3/uL (ref 0.1–1.0)
MONOS PCT: 13 %
Neutro Abs: 3.6 10*3/uL (ref 1.7–7.7)
Neutrophils Relative %: 47 %
PLATELETS: 192 10*3/uL (ref 150–400)
RBC: 4.11 MIL/uL (ref 3.87–5.11)
RDW: 12.8 % (ref 11.5–15.5)
WBC: 7.6 10*3/uL (ref 4.0–10.5)

## 2015-02-28 LAB — URINALYSIS, ROUTINE W REFLEX MICROSCOPIC
BILIRUBIN URINE: NEGATIVE
GLUCOSE, UA: NEGATIVE mg/dL
Hgb urine dipstick: NEGATIVE
Ketones, ur: NEGATIVE mg/dL
Nitrite: POSITIVE — AB
PH: 6 (ref 5.0–8.0)
PROTEIN: NEGATIVE mg/dL
Specific Gravity, Urine: 1.02 (ref 1.005–1.030)

## 2015-02-28 MED ORDER — NITROFURANTOIN MONOHYD MACRO 100 MG PO CAPS
100.0000 mg | ORAL_CAPSULE | Freq: Two times a day (BID) | ORAL | Status: DC
Start: 2015-02-28 — End: 2022-12-26

## 2015-02-28 NOTE — ED Provider Notes (Signed)
CSN: [id number]     Arrival date & time 02/28/15  0932 History   First MD Initiated Contact with Patient 02/28/15 0945     Chief Complaint  Patient presents with  . Fall     (Consider location/radiation/quality/duration/timing/severity/associated sxs/prior Treatment) Patient is a 82 y.o. female presenting with fall. The history is provided by a caregiver and the patient. History limited by: patient has hx of dementia.  Fall This is a recurrent problem. The current episode started today. Episode frequency: patient has fell twice this week. The problem has been gradually improving. Associated symptoms include urinary symptoms. Pertinent negatives include no chest pain, chills, coughing, fever, headaches, numbness, rash or weakness. The symptoms are aggravated by bending and standing. She has tried nothing for the symptoms.     HAZELINE BACHRACH is a 82 y.o. female who presents to the Emergency Department complaining of frequent fall and urinary symptoms.  PAtient's care giver states that she has been complaining of burning with urination, urinary frequency and caregiver reports malodorous urine for several days.  She contacted her PMD and was called in cipro which she has been taking since Thursday.  Caregiver states that she fell this morning and landed on her buttocks.  Patient reports mild pain to her lower back and bilateral hips.  Caregiver states that she did not strike her head or neck, no LOC, AMS.   Past Medical History  Diagnosis Date  . Polio     As a child  . Anxiety   . Depression   . HTN (hypertension)   . Seizures ([place])   . Colon polyp 5 yrs ago    in  [place]  . Anemia    Past Surgical History  Procedure Laterality Date  . Otif  2009    Right ankle  . Foot surgery  11/11    Left foot   No family history on file. Social History  Substance Use Topics  . Smoking status: Never Smoker   . Smokeless tobacco: None  . Alcohol Use: No   OB History    No data  available     Review of Systems  Constitutional: Negative for fever, chills and appetite change.  Respiratory: Negative for cough, chest tightness and shortness of breath.   Cardiovascular: Negative for chest pain.  Genitourinary: Positive for dysuria, frequency and difficulty urinating.  Skin: Negative for rash.  Neurological: Negative for dizziness, syncope, speech difficulty, weakness, numbness and headaches.  Psychiatric/Behavioral:       Hx of dementia, pt at baseline per caregiver      Allergies  Acetaminophen  Home Medications   Prior to Admission medications   Medication Sig Start Date End Date Taking? Authorizing Provider  Calcium Carbonate-Vitamin D (CALCIUM + D PO) Take 600 mg by mouth 2 (two) times daily.     Historical Provider, MD  cefUROXime (CEFTIN) 250 MG tablet Take 1 tablet (250 mg total) by mouth 2 (two) times daily with a meal. 11/14/14   Sinda Du, MD  diltiazem [facility]) 360 MG 24 hr capsule Take 360 mg by mouth daily. 10/23/12   Historical Provider, MD  donepezil (ARICEPT) 5 MG tablet Take 5 mg by mouth at bedtime.    Historical Provider, MD  escitalopram (LEXAPRO) 10 MG tablet Take 10 mg by mouth daily.      Historical Provider, MD  ferrous sulfate 325 (65 FE) MG tablet Take 325 mg by mouth 2 (two) times daily with a meal.  Historical Provider, MD  levothyroxine (SYNTHROID, LEVOTHROID) 100 MCG tablet Take 100 mcg by mouth daily before breakfast.    Historical Provider, MD  losartan-hydrochlorothiazide (HYZAAR) 100-12.5 MG per tablet Take 1 tablet by mouth daily. 05/09/13   Historical Provider, MD  metoprolol succinate (TOPROL-XL) 25 MG 24 hr tablet Take 25 mg by mouth every evening.     Historical Provider, MD  oxybutynin (DITROPAN-XL) 5 MG 24 hr tablet Take 5 mg by mouth daily. 10/16/12   Historical Provider, MD  pantoprazole (PROTONIX) 40 MG tablet Take 40 mg by mouth daily.    Historical Provider, MD  phenytoin (DILANTIN) 125 MG/5ML suspension Take 125  mg by mouth every morning.    Historical Provider, MD  polyethylene glycol powder (GLYCOLAX/MIRALAX) powder Take 17 g by mouth daily. 04/15/13   Historical Provider, MD  primidone (MYSOLINE) 50 MG tablet Take 200 mg by mouth 3 (three) times daily.    Historical Provider, MD  raloxifene (EVISTA) 60 MG tablet Take 60 mg by mouth daily.      Historical Provider, MD   BP 154/68 mmHg  Pulse 78  Temp(Src) 97.6 F (36.4 C) (Oral)  Resp 18  Ht 5\' 6"  (1.676 m)  Wt 78.472 kg  BMI 27.94 kg/m2  SpO2 100% Physical Exam  Constitutional: She appears well-developed and well-nourished. No distress.  HENT:  Head: Normocephalic and atraumatic.  Eyes: Conjunctivae and EOM are normal. Pupils are equal, round, and reactive to light.  Neck: Normal range of motion. Neck supple.  Cardiovascular: Normal rate, regular rhythm, normal heart sounds and intact distal pulses.   No murmur heard. Pulmonary/Chest: Effort normal and breath sounds normal. No respiratory distress. She exhibits no tenderness.  Abdominal: Soft. She exhibits no distension. There is no tenderness. There is no rebound.  Musculoskeletal:  Mild ttp of the lower lumbar spine and bilateral paraspinal muscles.  Pt has full ROM of the bilateral hips.  No shortening or external rotation  Neurological: She is alert. She exhibits normal muscle tone. Coordination normal.  Skin: Skin is warm and dry. No rash noted.  No abrasions, edema or ecchymosis  Nursing note and vitals reviewed.   ED Course  Procedures (including critical care time) Labs Review Labs Reviewed  URINALYSIS, ROUTINE W REFLEX MICROSCOPIC (NOT AT [facility]) - Abnormal; Notable for the following:    Nitrite POSITIVE (*)    Leukocytes, UA TRACE (*)    All other components within normal limits  BASIC METABOLIC PANEL - Abnormal; Notable for the following:    Potassium 3.4 (*)    Chloride 99 (*)    Glucose, Bld 59 (*)    BUN 22 (*)    Creatinine, Ser 1.03 (*)    GFR calc non Af Amer 50  (*)    GFR calc Af Amer 58 (*)    All other components within normal limits  URINE MICROSCOPIC-ADD ON - Abnormal; Notable for the following:    Squamous Epithelial / LPF 6-30 (*)    Bacteria, UA FEW (*)    All other components within normal limits  URINE CULTURE  CBC WITH DIFFERENTIAL/PLATELET    Imaging Review Dg Lumbar Spine Complete  02/28/2015  CLINICAL DATA:  Fall earlier today with low back pain radiating into right lower extremity. Initial encounter. EXAM: LUMBAR SPINE - COMPLETE 4+ VIEW COMPARISON:  None. FINDINGS: No acute fracture or subluxation is identified. Bones appear osteopenic. No bony lesions are identified. No significant disc space narrowing. In the frontal projection, visualized bony pelvis,  sacrum and sacroiliac joints appear normal. IMPRESSION: No acute findings. Electronically Signed   By: Aletta Edouard M.D.   On: 02/28/2015 11:42   Dg Hips Bilat With Pelvis 2v  02/28/2015  CLINICAL DATA:  Fall this morning with right leg pain. Initial encounter. EXAM: DG HIP (WITH OR WITHOUT PELVIS) 2V BILAT COMPARISON:  None. FINDINGS: Remote proximal left femur fracture status post ORIF. No evidence for hardware complication. No evidence of acute hip or pelvic ring fracture. No notable degenerative hip narrowing or spurring. Osteopenia. IMPRESSION: Negative. Electronically Signed   By: Monte Fantasia M.D.   On: 02/28/2015 11:38   I have personally reviewed and evaluated these images and lab results as part of my medical decision-making.   EKG Interpretation None      MDM   Final diagnoses:  UTI (urinary tract infection), uncomplicated  Fall, initial encounter   Pt is well appearing.  Vitals stable.  Resting comfortably  Pt seen by Dr. Jeneen Rinks as well and care plan discussed.  Previous urine culture from 10/16 showed resistance to Cipro and pt has hx of seizures, so I will d/c the cipro and start macrobid. urine culture pending. Caregiver Advised to arrange close PMD f/u ,  ER return if needed     Kem Parkinson, Hershal Coria 03/03/15 2216  Tanna Furry, MD 03/12/15 [phone]

## 2015-02-28 NOTE — ED Notes (Signed)
Per pt sister, pt niece is on the way to receive d/c information and provide d/c transportation.

## 2015-02-28 NOTE — ED Notes (Signed)
Per EMS- pt fell around 0200 this am and complained of pain to right lower back radiating down right leg. Pt family also reported frequent urination. Pt has dementia and lives with sister. Pt denies pain at present, but states she thinks she has a UTI. Pt alert and oriented to person, place and circumstance. Nad noted.

## 2015-02-28 NOTE — Discharge Instructions (Signed)

## 2015-03-02 LAB — URINE CULTURE

## 2015-04-03 DIAGNOSIS — I10 Essential (primary) hypertension: Secondary | ICD-10-CM | POA: Diagnosis not present

## 2015-04-03 DIAGNOSIS — J449 Chronic obstructive pulmonary disease, unspecified: Secondary | ICD-10-CM | POA: Diagnosis not present

## 2015-04-03 DIAGNOSIS — G40909 Epilepsy, unspecified, not intractable, without status epilepticus: Secondary | ICD-10-CM | POA: Diagnosis not present

## 2015-04-03 DIAGNOSIS — F329 Major depressive disorder, single episode, unspecified: Secondary | ICD-10-CM | POA: Diagnosis not present

## 2015-04-03 DIAGNOSIS — Z23 Encounter for immunization: Secondary | ICD-10-CM | POA: Diagnosis not present

## 2015-04-14 DIAGNOSIS — M159 Polyosteoarthritis, unspecified: Secondary | ICD-10-CM | POA: Diagnosis not present

## 2015-04-14 DIAGNOSIS — F329 Major depressive disorder, single episode, unspecified: Secondary | ICD-10-CM | POA: Diagnosis not present

## 2015-04-14 DIAGNOSIS — M6281 Muscle weakness (generalized): Secondary | ICD-10-CM | POA: Diagnosis not present

## 2015-04-14 DIAGNOSIS — R296 Repeated falls: Secondary | ICD-10-CM | POA: Diagnosis not present

## 2015-04-14 DIAGNOSIS — G40909 Epilepsy, unspecified, not intractable, without status epilepticus: Secondary | ICD-10-CM | POA: Diagnosis not present

## 2015-04-14 DIAGNOSIS — K210 Gastro-esophageal reflux disease with esophagitis: Secondary | ICD-10-CM | POA: Diagnosis not present

## 2015-04-14 DIAGNOSIS — I10 Essential (primary) hypertension: Secondary | ICD-10-CM | POA: Diagnosis not present

## 2015-04-14 DIAGNOSIS — R413 Other amnesia: Secondary | ICD-10-CM | POA: Diagnosis not present

## 2015-04-16 DIAGNOSIS — I10 Essential (primary) hypertension: Secondary | ICD-10-CM | POA: Diagnosis not present

## 2015-04-16 DIAGNOSIS — R296 Repeated falls: Secondary | ICD-10-CM | POA: Diagnosis not present

## 2015-04-16 DIAGNOSIS — M159 Polyosteoarthritis, unspecified: Secondary | ICD-10-CM | POA: Diagnosis not present

## 2015-04-16 DIAGNOSIS — M6281 Muscle weakness (generalized): Secondary | ICD-10-CM | POA: Diagnosis not present

## 2015-04-16 DIAGNOSIS — G40909 Epilepsy, unspecified, not intractable, without status epilepticus: Secondary | ICD-10-CM | POA: Diagnosis not present

## 2015-04-16 DIAGNOSIS — R413 Other amnesia: Secondary | ICD-10-CM | POA: Diagnosis not present

## 2015-04-21 DIAGNOSIS — G40909 Epilepsy, unspecified, not intractable, without status epilepticus: Secondary | ICD-10-CM | POA: Diagnosis not present

## 2015-04-21 DIAGNOSIS — M159 Polyosteoarthritis, unspecified: Secondary | ICD-10-CM | POA: Diagnosis not present

## 2015-04-21 DIAGNOSIS — R296 Repeated falls: Secondary | ICD-10-CM | POA: Diagnosis not present

## 2015-04-21 DIAGNOSIS — R413 Other amnesia: Secondary | ICD-10-CM | POA: Diagnosis not present

## 2015-04-21 DIAGNOSIS — M6281 Muscle weakness (generalized): Secondary | ICD-10-CM | POA: Diagnosis not present

## 2015-04-21 DIAGNOSIS — I10 Essential (primary) hypertension: Secondary | ICD-10-CM | POA: Diagnosis not present

## 2015-04-23 DIAGNOSIS — R413 Other amnesia: Secondary | ICD-10-CM | POA: Diagnosis not present

## 2015-04-23 DIAGNOSIS — R296 Repeated falls: Secondary | ICD-10-CM | POA: Diagnosis not present

## 2015-04-23 DIAGNOSIS — I10 Essential (primary) hypertension: Secondary | ICD-10-CM | POA: Diagnosis not present

## 2015-04-23 DIAGNOSIS — M159 Polyosteoarthritis, unspecified: Secondary | ICD-10-CM | POA: Diagnosis not present

## 2015-04-23 DIAGNOSIS — G40909 Epilepsy, unspecified, not intractable, without status epilepticus: Secondary | ICD-10-CM | POA: Diagnosis not present

## 2015-04-23 DIAGNOSIS — M6281 Muscle weakness (generalized): Secondary | ICD-10-CM | POA: Diagnosis not present

## 2015-04-24 DIAGNOSIS — G40909 Epilepsy, unspecified, not intractable, without status epilepticus: Secondary | ICD-10-CM | POA: Diagnosis not present

## 2015-04-24 DIAGNOSIS — M6281 Muscle weakness (generalized): Secondary | ICD-10-CM | POA: Diagnosis not present

## 2015-04-24 DIAGNOSIS — I10 Essential (primary) hypertension: Secondary | ICD-10-CM | POA: Diagnosis not present

## 2015-04-24 DIAGNOSIS — R296 Repeated falls: Secondary | ICD-10-CM | POA: Diagnosis not present

## 2015-04-24 DIAGNOSIS — R413 Other amnesia: Secondary | ICD-10-CM | POA: Diagnosis not present

## 2015-04-24 DIAGNOSIS — M159 Polyosteoarthritis, unspecified: Secondary | ICD-10-CM | POA: Diagnosis not present

## 2015-04-28 DIAGNOSIS — M6281 Muscle weakness (generalized): Secondary | ICD-10-CM | POA: Diagnosis not present

## 2015-04-28 DIAGNOSIS — M159 Polyosteoarthritis, unspecified: Secondary | ICD-10-CM | POA: Diagnosis not present

## 2015-04-28 DIAGNOSIS — I10 Essential (primary) hypertension: Secondary | ICD-10-CM | POA: Diagnosis not present

## 2015-04-28 DIAGNOSIS — R296 Repeated falls: Secondary | ICD-10-CM | POA: Diagnosis not present

## 2015-04-28 DIAGNOSIS — R413 Other amnesia: Secondary | ICD-10-CM | POA: Diagnosis not present

## 2015-04-28 DIAGNOSIS — G40909 Epilepsy, unspecified, not intractable, without status epilepticus: Secondary | ICD-10-CM | POA: Diagnosis not present

## 2015-04-30 DIAGNOSIS — I10 Essential (primary) hypertension: Secondary | ICD-10-CM | POA: Diagnosis not present

## 2015-04-30 DIAGNOSIS — M159 Polyosteoarthritis, unspecified: Secondary | ICD-10-CM | POA: Diagnosis not present

## 2015-04-30 DIAGNOSIS — G40909 Epilepsy, unspecified, not intractable, without status epilepticus: Secondary | ICD-10-CM | POA: Diagnosis not present

## 2015-04-30 DIAGNOSIS — R413 Other amnesia: Secondary | ICD-10-CM | POA: Diagnosis not present

## 2015-04-30 DIAGNOSIS — R296 Repeated falls: Secondary | ICD-10-CM | POA: Diagnosis not present

## 2015-04-30 DIAGNOSIS — M6281 Muscle weakness (generalized): Secondary | ICD-10-CM | POA: Diagnosis not present

## 2015-05-01 DIAGNOSIS — R296 Repeated falls: Secondary | ICD-10-CM | POA: Diagnosis not present

## 2015-05-01 DIAGNOSIS — M159 Polyosteoarthritis, unspecified: Secondary | ICD-10-CM | POA: Diagnosis not present

## 2015-05-01 DIAGNOSIS — M6281 Muscle weakness (generalized): Secondary | ICD-10-CM | POA: Diagnosis not present

## 2015-05-01 DIAGNOSIS — G40909 Epilepsy, unspecified, not intractable, without status epilepticus: Secondary | ICD-10-CM | POA: Diagnosis not present

## 2015-05-01 DIAGNOSIS — I10 Essential (primary) hypertension: Secondary | ICD-10-CM | POA: Diagnosis not present

## 2015-05-01 DIAGNOSIS — R413 Other amnesia: Secondary | ICD-10-CM | POA: Diagnosis not present

## 2015-05-05 DIAGNOSIS — G40909 Epilepsy, unspecified, not intractable, without status epilepticus: Secondary | ICD-10-CM | POA: Diagnosis not present

## 2015-05-05 DIAGNOSIS — M6281 Muscle weakness (generalized): Secondary | ICD-10-CM | POA: Diagnosis not present

## 2015-05-05 DIAGNOSIS — I10 Essential (primary) hypertension: Secondary | ICD-10-CM | POA: Diagnosis not present

## 2015-05-05 DIAGNOSIS — R413 Other amnesia: Secondary | ICD-10-CM | POA: Diagnosis not present

## 2015-05-05 DIAGNOSIS — R296 Repeated falls: Secondary | ICD-10-CM | POA: Diagnosis not present

## 2015-05-05 DIAGNOSIS — M159 Polyosteoarthritis, unspecified: Secondary | ICD-10-CM | POA: Diagnosis not present

## 2015-05-07 DIAGNOSIS — G40909 Epilepsy, unspecified, not intractable, without status epilepticus: Secondary | ICD-10-CM | POA: Diagnosis not present

## 2015-05-07 DIAGNOSIS — R413 Other amnesia: Secondary | ICD-10-CM | POA: Diagnosis not present

## 2015-05-07 DIAGNOSIS — R296 Repeated falls: Secondary | ICD-10-CM | POA: Diagnosis not present

## 2015-05-07 DIAGNOSIS — M159 Polyosteoarthritis, unspecified: Secondary | ICD-10-CM | POA: Diagnosis not present

## 2015-05-07 DIAGNOSIS — I10 Essential (primary) hypertension: Secondary | ICD-10-CM | POA: Diagnosis not present

## 2015-05-07 DIAGNOSIS — M6281 Muscle weakness (generalized): Secondary | ICD-10-CM | POA: Diagnosis not present

## 2015-05-12 DIAGNOSIS — R296 Repeated falls: Secondary | ICD-10-CM | POA: Diagnosis not present

## 2015-05-12 DIAGNOSIS — R413 Other amnesia: Secondary | ICD-10-CM | POA: Diagnosis not present

## 2015-05-12 DIAGNOSIS — I10 Essential (primary) hypertension: Secondary | ICD-10-CM | POA: Diagnosis not present

## 2015-05-12 DIAGNOSIS — M159 Polyosteoarthritis, unspecified: Secondary | ICD-10-CM | POA: Diagnosis not present

## 2015-05-12 DIAGNOSIS — M6281 Muscle weakness (generalized): Secondary | ICD-10-CM | POA: Diagnosis not present

## 2015-05-12 DIAGNOSIS — G40909 Epilepsy, unspecified, not intractable, without status epilepticus: Secondary | ICD-10-CM | POA: Diagnosis not present

## 2015-05-14 DIAGNOSIS — M6281 Muscle weakness (generalized): Secondary | ICD-10-CM | POA: Diagnosis not present

## 2015-05-14 DIAGNOSIS — G40909 Epilepsy, unspecified, not intractable, without status epilepticus: Secondary | ICD-10-CM | POA: Diagnosis not present

## 2015-05-14 DIAGNOSIS — I10 Essential (primary) hypertension: Secondary | ICD-10-CM | POA: Diagnosis not present

## 2015-05-14 DIAGNOSIS — R413 Other amnesia: Secondary | ICD-10-CM | POA: Diagnosis not present

## 2015-05-14 DIAGNOSIS — M159 Polyosteoarthritis, unspecified: Secondary | ICD-10-CM | POA: Diagnosis not present

## 2015-05-14 DIAGNOSIS — R296 Repeated falls: Secondary | ICD-10-CM | POA: Diagnosis not present

## 2015-05-19 DIAGNOSIS — R296 Repeated falls: Secondary | ICD-10-CM | POA: Diagnosis not present

## 2015-05-19 DIAGNOSIS — I10 Essential (primary) hypertension: Secondary | ICD-10-CM | POA: Diagnosis not present

## 2015-05-19 DIAGNOSIS — M6281 Muscle weakness (generalized): Secondary | ICD-10-CM | POA: Diagnosis not present

## 2015-05-19 DIAGNOSIS — G40909 Epilepsy, unspecified, not intractable, without status epilepticus: Secondary | ICD-10-CM | POA: Diagnosis not present

## 2015-05-19 DIAGNOSIS — M159 Polyosteoarthritis, unspecified: Secondary | ICD-10-CM | POA: Diagnosis not present

## 2015-05-19 DIAGNOSIS — R413 Other amnesia: Secondary | ICD-10-CM | POA: Diagnosis not present

## 2015-05-22 DIAGNOSIS — M159 Polyosteoarthritis, unspecified: Secondary | ICD-10-CM | POA: Diagnosis not present

## 2015-05-22 DIAGNOSIS — M6281 Muscle weakness (generalized): Secondary | ICD-10-CM | POA: Diagnosis not present

## 2015-05-22 DIAGNOSIS — R296 Repeated falls: Secondary | ICD-10-CM | POA: Diagnosis not present

## 2015-05-22 DIAGNOSIS — R413 Other amnesia: Secondary | ICD-10-CM | POA: Diagnosis not present

## 2015-05-22 DIAGNOSIS — I10 Essential (primary) hypertension: Secondary | ICD-10-CM | POA: Diagnosis not present

## 2015-05-22 DIAGNOSIS — G40909 Epilepsy, unspecified, not intractable, without status epilepticus: Secondary | ICD-10-CM | POA: Diagnosis not present

## 2015-05-26 DIAGNOSIS — M159 Polyosteoarthritis, unspecified: Secondary | ICD-10-CM | POA: Diagnosis not present

## 2015-05-26 DIAGNOSIS — R296 Repeated falls: Secondary | ICD-10-CM | POA: Diagnosis not present

## 2015-05-26 DIAGNOSIS — G40909 Epilepsy, unspecified, not intractable, without status epilepticus: Secondary | ICD-10-CM | POA: Diagnosis not present

## 2015-05-26 DIAGNOSIS — R413 Other amnesia: Secondary | ICD-10-CM | POA: Diagnosis not present

## 2015-05-26 DIAGNOSIS — M6281 Muscle weakness (generalized): Secondary | ICD-10-CM | POA: Diagnosis not present

## 2015-05-26 DIAGNOSIS — I10 Essential (primary) hypertension: Secondary | ICD-10-CM | POA: Diagnosis not present

## 2015-05-28 DIAGNOSIS — M6281 Muscle weakness (generalized): Secondary | ICD-10-CM | POA: Diagnosis not present

## 2015-05-28 DIAGNOSIS — I10 Essential (primary) hypertension: Secondary | ICD-10-CM | POA: Diagnosis not present

## 2015-05-28 DIAGNOSIS — R296 Repeated falls: Secondary | ICD-10-CM | POA: Diagnosis not present

## 2015-05-28 DIAGNOSIS — R413 Other amnesia: Secondary | ICD-10-CM | POA: Diagnosis not present

## 2015-05-28 DIAGNOSIS — G40909 Epilepsy, unspecified, not intractable, without status epilepticus: Secondary | ICD-10-CM | POA: Diagnosis not present

## 2015-05-28 DIAGNOSIS — M159 Polyosteoarthritis, unspecified: Secondary | ICD-10-CM | POA: Diagnosis not present

## 2015-05-29 DIAGNOSIS — R413 Other amnesia: Secondary | ICD-10-CM | POA: Diagnosis not present

## 2015-05-29 DIAGNOSIS — M159 Polyosteoarthritis, unspecified: Secondary | ICD-10-CM | POA: Diagnosis not present

## 2015-05-29 DIAGNOSIS — R296 Repeated falls: Secondary | ICD-10-CM | POA: Diagnosis not present

## 2015-05-29 DIAGNOSIS — G40909 Epilepsy, unspecified, not intractable, without status epilepticus: Secondary | ICD-10-CM | POA: Diagnosis not present

## 2015-05-29 DIAGNOSIS — I10 Essential (primary) hypertension: Secondary | ICD-10-CM | POA: Diagnosis not present

## 2015-05-29 DIAGNOSIS — M6281 Muscle weakness (generalized): Secondary | ICD-10-CM | POA: Diagnosis not present

## 2015-06-02 DIAGNOSIS — R413 Other amnesia: Secondary | ICD-10-CM | POA: Diagnosis not present

## 2015-06-02 DIAGNOSIS — G40909 Epilepsy, unspecified, not intractable, without status epilepticus: Secondary | ICD-10-CM | POA: Diagnosis not present

## 2015-06-02 DIAGNOSIS — M159 Polyosteoarthritis, unspecified: Secondary | ICD-10-CM | POA: Diagnosis not present

## 2015-06-02 DIAGNOSIS — R296 Repeated falls: Secondary | ICD-10-CM | POA: Diagnosis not present

## 2015-06-02 DIAGNOSIS — I10 Essential (primary) hypertension: Secondary | ICD-10-CM | POA: Diagnosis not present

## 2015-06-02 DIAGNOSIS — M6281 Muscle weakness (generalized): Secondary | ICD-10-CM | POA: Diagnosis not present

## 2015-06-03 DIAGNOSIS — G40909 Epilepsy, unspecified, not intractable, without status epilepticus: Secondary | ICD-10-CM | POA: Diagnosis not present

## 2015-06-03 DIAGNOSIS — R296 Repeated falls: Secondary | ICD-10-CM | POA: Diagnosis not present

## 2015-06-03 DIAGNOSIS — R413 Other amnesia: Secondary | ICD-10-CM | POA: Diagnosis not present

## 2015-06-03 DIAGNOSIS — I10 Essential (primary) hypertension: Secondary | ICD-10-CM | POA: Diagnosis not present

## 2015-06-03 DIAGNOSIS — M6281 Muscle weakness (generalized): Secondary | ICD-10-CM | POA: Diagnosis not present

## 2015-06-03 DIAGNOSIS — M159 Polyosteoarthritis, unspecified: Secondary | ICD-10-CM | POA: Diagnosis not present

## 2015-06-04 DIAGNOSIS — R296 Repeated falls: Secondary | ICD-10-CM | POA: Diagnosis not present

## 2015-06-04 DIAGNOSIS — M159 Polyosteoarthritis, unspecified: Secondary | ICD-10-CM | POA: Diagnosis not present

## 2015-06-04 DIAGNOSIS — M6281 Muscle weakness (generalized): Secondary | ICD-10-CM | POA: Diagnosis not present

## 2015-06-04 DIAGNOSIS — I10 Essential (primary) hypertension: Secondary | ICD-10-CM | POA: Diagnosis not present

## 2015-06-04 DIAGNOSIS — G40909 Epilepsy, unspecified, not intractable, without status epilepticus: Secondary | ICD-10-CM | POA: Diagnosis not present

## 2015-06-04 DIAGNOSIS — R413 Other amnesia: Secondary | ICD-10-CM | POA: Diagnosis not present

## 2015-06-09 DIAGNOSIS — I10 Essential (primary) hypertension: Secondary | ICD-10-CM | POA: Diagnosis not present

## 2015-06-09 DIAGNOSIS — M6281 Muscle weakness (generalized): Secondary | ICD-10-CM | POA: Diagnosis not present

## 2015-06-09 DIAGNOSIS — R413 Other amnesia: Secondary | ICD-10-CM | POA: Diagnosis not present

## 2015-06-09 DIAGNOSIS — G40909 Epilepsy, unspecified, not intractable, without status epilepticus: Secondary | ICD-10-CM | POA: Diagnosis not present

## 2015-06-09 DIAGNOSIS — R296 Repeated falls: Secondary | ICD-10-CM | POA: Diagnosis not present

## 2015-06-09 DIAGNOSIS — M159 Polyosteoarthritis, unspecified: Secondary | ICD-10-CM | POA: Diagnosis not present

## 2015-06-12 DIAGNOSIS — R413 Other amnesia: Secondary | ICD-10-CM | POA: Diagnosis not present

## 2015-06-12 DIAGNOSIS — M159 Polyosteoarthritis, unspecified: Secondary | ICD-10-CM | POA: Diagnosis not present

## 2015-06-12 DIAGNOSIS — R296 Repeated falls: Secondary | ICD-10-CM | POA: Diagnosis not present

## 2015-06-12 DIAGNOSIS — G40909 Epilepsy, unspecified, not intractable, without status epilepticus: Secondary | ICD-10-CM | POA: Diagnosis not present

## 2015-06-12 DIAGNOSIS — I10 Essential (primary) hypertension: Secondary | ICD-10-CM | POA: Diagnosis not present

## 2015-06-12 DIAGNOSIS — M6281 Muscle weakness (generalized): Secondary | ICD-10-CM | POA: Diagnosis not present

## 2015-06-25 DIAGNOSIS — B078 Other viral warts: Secondary | ICD-10-CM | POA: Diagnosis not present

## 2015-06-25 DIAGNOSIS — L570 Actinic keratosis: Secondary | ICD-10-CM | POA: Diagnosis not present

## 2015-06-25 DIAGNOSIS — X32XXXA Exposure to sunlight, initial encounter: Secondary | ICD-10-CM | POA: Diagnosis not present

## 2015-06-25 DIAGNOSIS — L918 Other hypertrophic disorders of the skin: Secondary | ICD-10-CM | POA: Diagnosis not present

## 2015-06-25 DIAGNOSIS — D225 Melanocytic nevi of trunk: Secondary | ICD-10-CM | POA: Diagnosis not present

## 2015-07-01 ENCOUNTER — Emergency Department (HOSPITAL_COMMUNITY): Payer: Medicare Other

## 2015-07-01 ENCOUNTER — Encounter (HOSPITAL_COMMUNITY): Payer: Self-pay | Admitting: Emergency Medicine

## 2015-07-01 ENCOUNTER — Emergency Department (HOSPITAL_COMMUNITY)
Admission: EM | Admit: 2015-07-01 | Discharge: 2015-07-01 | Disposition: A | Discharge status: Home / Self Care | Payer: Medicare Other | Attending: Emergency Medicine | Admitting: Emergency Medicine

## 2015-07-01 DIAGNOSIS — Y939 Activity, unspecified: Secondary | ICD-10-CM | POA: Diagnosis not present

## 2015-07-01 DIAGNOSIS — R51 Headache: Secondary | ICD-10-CM | POA: Diagnosis not present

## 2015-07-01 DIAGNOSIS — I10 Essential (primary) hypertension: Secondary | ICD-10-CM | POA: Insufficient documentation

## 2015-07-01 DIAGNOSIS — F329 Major depressive disorder, single episode, unspecified: Secondary | ICD-10-CM | POA: Insufficient documentation

## 2015-07-01 DIAGNOSIS — R29898 Other symptoms and signs involving the musculoskeletal system: Secondary | ICD-10-CM | POA: Diagnosis not present

## 2015-07-01 DIAGNOSIS — W06XXXA Fall from bed, initial encounter: Secondary | ICD-10-CM | POA: Insufficient documentation

## 2015-07-01 DIAGNOSIS — M25561 Pain in right knee: Secondary | ICD-10-CM | POA: Diagnosis not present

## 2015-07-01 DIAGNOSIS — Y92009 Unspecified place in unspecified non-institutional (private) residence as the place of occurrence of the external cause: Secondary | ICD-10-CM | POA: Diagnosis not present

## 2015-07-01 DIAGNOSIS — W19XXXA Unspecified fall, initial encounter: Secondary | ICD-10-CM

## 2015-07-01 DIAGNOSIS — Y999 Unspecified external cause status: Secondary | ICD-10-CM | POA: Diagnosis not present

## 2015-07-01 DIAGNOSIS — R42 Dizziness and giddiness: Secondary | ICD-10-CM | POA: Insufficient documentation

## 2015-07-01 DIAGNOSIS — S0990XA Unspecified injury of head, initial encounter: Secondary | ICD-10-CM | POA: Diagnosis not present

## 2015-07-01 LAB — I-STAT CHEM 8, ED
BUN: 24 mg/dL — ABNORMAL HIGH (ref 6–20)
CHLORIDE: 93 mmol/L — AB (ref 101–111)
Calcium, Ion: 1.15 mmol/L (ref 1.13–1.30)
Creatinine, Ser: 1.1 mg/dL — ABNORMAL HIGH (ref 0.44–1.00)
GLUCOSE: 86 mg/dL (ref 65–99)
HCT: 42 % (ref 36.0–46.0)
HEMOGLOBIN: 14.3 g/dL (ref 12.0–15.0)
POTASSIUM: 3.9 mmol/L (ref 3.5–5.1)
SODIUM: 136 mmol/L (ref 135–145)
TCO2: 31 mmol/L (ref 0–100)

## 2015-07-01 NOTE — ED Provider Notes (Signed)
CSN: [id number]     Arrival date & time 07/01/15  0744 History   None    Chief Complaint  Patient presents with  . Knee Pain     (Consider location/radiation/quality/duration/timing/severity/associated sxs/prior Treatment) HPI Brittney Reynolds is a 82 y/o with a PMHx of polio in childhood, anxiety, depression, HTN, seizures and anemia bougth in by EMS with knee pain after slipping out of bed this morning.   She notes this morning around 7 AM systolic sitting to get out of bed unassisted, she slipped and fell on her right knee. She notes her patella hit the carpeted directly. She notes inability to weight bear or move the knee immediately after the fall. She noted mild swelling immediately after the fall. She did not note a popping sensation with the injury. She denies any other trauma. No injury to the head or LOC.  At baseline she states she normally uses a wheelchair. She has caretakers 24hrs per day per her report but she is not accompanied by anyone right now.   Past Medical History  Diagnosis Date  . Polio     As a child  . Anxiety   . Depression   . HTN (hypertension)   . Seizures ([place])   . Colon polyp 5 yrs ago    in  [place]  . Anemia    Past Surgical History  Procedure Laterality Date  . Otif  2009    Right ankle  . Foot surgery  11/11    Left foot   History reviewed. No pertinent family history. Social History  Substance Use Topics  . Smoking status: Never Smoker   . Smokeless tobacco: None  . Alcohol Use: No   OB History    No data available     Review of Systems  Constitutional: Negative for fever, chills, diaphoresis, activity change and fatigue.  HENT: Negative.   Eyes: Negative for photophobia, pain and visual disturbance.  Respiratory: Negative for cough, chest tightness, shortness of breath, wheezing and stridor.   Cardiovascular: Negative for chest pain, palpitations and leg swelling.  Gastrointestinal: Negative for nausea, vomiting, abdominal  pain, blood in stool and abdominal distention.  Endocrine: Negative.   Genitourinary: Negative.   Musculoskeletal: Positive for myalgias, joint swelling, arthralgias and gait problem. Negative for back pain, neck pain and neck stiffness.  Skin: Negative for rash.  Allergic/Immunologic: Negative.   Neurological: Negative for dizziness, tremors, syncope, speech difficulty, weakness, light-headedness, numbness and headaches.  Hematological: Negative.   Psychiatric/Behavioral: Negative for confusion.      Allergies  Acetaminophen  Home Medications   Prior to Admission medications   Medication Sig Start Date End Date Taking? Authorizing Provider  Calcium Carbonate-Vitamin D (CALCIUM + D PO) Take 600 mg by mouth 2 (two) times daily.     Historical Provider, MD  diltiazem (TIAZAC) 360 MG 24 hr capsule Take 360 mg by mouth daily. 10/23/12   Historical Provider, MD  donepezil (ARICEPT) 5 MG tablet Take 5 mg by mouth at bedtime.    Historical Provider, MD  escitalopram (LEXAPRO) 10 MG tablet Take 10 mg by mouth daily.      Historical Provider, MD  ferrous sulfate 325 (65 FE) MG tablet Take 325 mg by mouth 2 (two) times daily with a meal.    Historical Provider, MD  levothyroxine (SYNTHROID, LEVOTHROID) 100 MCG tablet Take 100 mcg by mouth daily before breakfast.    Historical Provider, MD  losartan-hydrochlorothiazide (HYZAAR) 100-12.5 MG per tablet Take 1 tablet  by mouth daily. 05/09/13   Historical Provider, MD  metoprolol succinate (TOPROL-XL) 25 MG 24 hr tablet Take 25 mg by mouth every evening.     Historical Provider, MD  nitrofurantoin, macrocrystal-monohydrate, (MACROBID) 100 MG capsule Take 1 capsule (100 mg total) by mouth 2 (two) times daily. For 7 days 02/28/15   Tammy Triplett, PA-C  oxybutynin (DITROPAN-XL) 5 MG 24 hr tablet Take 5 mg by mouth daily. 10/16/12   Historical Provider, MD  pantoprazole (PROTONIX) 40 MG tablet Take 40 mg by mouth daily.    Historical Provider, MD  phenytoin  (DILANTIN) 125 MG/5ML suspension Take 125 mg by mouth every morning.    Historical Provider, MD  polyethylene glycol powder (GLYCOLAX/MIRALAX) powder Take 17 g by mouth daily. 04/15/13   Historical Provider, MD  primidone (MYSOLINE) 50 MG tablet Take 200 mg by mouth 3 (three) times daily.    Historical Provider, MD  raloxifene (EVISTA) 60 MG tablet Take 60 mg by mouth daily.      Historical Provider, MD   BP 141/79 mmHg  Pulse 64  Temp(Src) 97.7 F (36.5 C) (Oral)  Resp 16  Ht 5\' 6"  (1.676 m)  Wt 80.74 kg  BMI 28.74 kg/m2  SpO2 99% Physical Exam  Constitutional: She appears well-developed and well-nourished. No distress.  HENT:  Head: Normocephalic and atraumatic.  Mouth/Throat: Oropharynx is clear and moist. No oropharyngeal exudate.  Eyes: Pupils are equal, round, and reactive to light. Right eye exhibits no discharge. Left eye exhibits no discharge. No scleral icterus.  Neck: Neck supple. No JVD present.  Cardiovascular: Normal rate, regular rhythm, normal heart sounds and intact distal pulses.  Exam reveals no gallop and no friction rub.   No murmur heard. Pulmonary/Chest: Effort normal and breath sounds normal. No respiratory distress. She has no wheezes. She has no rales.  Abdominal: Soft. Bowel sounds are normal. She exhibits no distension. There is no tenderness. There is no rebound and no guarding.  Musculoskeletal: Normal range of motion.  Right knee: Mild effusion noted on the medial aspect of the knee. No point tenderness. Normal patellar glide apprehension. Normal range of motion when compared to the left knee. Patient endorses pain with terminal flexion at the medial aspect of her right knee, with associated tenderness to palpation with this movement. Negative McMurray's. Solid endpoints on anterior cruciate ligament, PCL, LCL, MCL testing. 5/5 strength when compated to the left on my exam. Normal posterior tibialis pulses. Brisk capillary refill distally.  Left knee: Erythema  over the anterior aspect appearing like carpet burn. Normal to exam otherwise. No ecchymosis or point tenderness over the hips, shoulders, spine, elbows, hands on my exam.  Neurological: She is alert. She exhibits normal muscle tone. Coordination normal.  Skin: Skin is warm. No rash noted. She is not diaphoretic.  Psychiatric: She has a normal mood and affect.    ED Course  Procedures (including critical care time)  930: patient noted to have erythema and mild swelling over the left lateral aspect of her face, new from initial examination. NO tenderness over the area. Neurologic exam non-focal- Alert. Speech clear. PERRLA,  EOMI, Uvula and tongue midline. Facial movements symmetric. 5/5 strength in the upper extremities and lower extremities bilaterally. Sensation intact bilaterally. Given her age, will get a CT head w/o contrast.  Labs Review Labs Reviewed  I-STAT CHEM 8, ED - Abnormal; Notable for the following:    Chloride 93 (*)    BUN 24 (*)    Creatinine, Ser  1.10 (*)    All other components within normal limits    Imaging Review Ct Head Wo Contrast  07/01/2015  CLINICAL DATA:  Pain following fall.  History of poliomyelitis EXAM: CT HEAD WITHOUT CONTRAST TECHNIQUE: Contiguous axial images were obtained from the base of the skull through the vertex without intravenous contrast. COMPARISON:  Head CT November 12, 2014; brain MRI November 13, 2014 FINDINGS: The ventricles are normal in size and configuration. Marked generalized cerebellar atrophy is a stable finding. There is no intracranial mass, hemorrhage, extra-axial fluid collection, or midline shift. There is evidence of a prior infarct on the right with involvement of portions of the right posterior frontal, superior right temporal, and anterior right parietal lobes, stable with encephalomalacia in these areas. There is evidence of a prior infarct in the right splenium of the corpus callosum, stable. No new gray-white compartment  lesions are identified. No acute infarct evident. The bony calvarium appears intact. The mastoid air cells are clear. No intraorbital lesions are evident. IMPRESSION: Extensive cerebellar atrophy, stable and likely due to poliomyelitis residua. 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 Small prior infarct right splenium of corpus callosum. No acute infarct evident. No hemorrhage or mass effect. Electronically Signed   By: Lowella Grip III M.D.   On: 07/01/2015 09:58   Dg Knee Complete 4 Views Right  07/01/2015  CLINICAL DATA:  Acute right knee pain after fall at home today. Initial encounter. EXAM: RIGHT KNEE - COMPLETE 4+ VIEW COMPARISON:  None. FINDINGS: No evidence of fracture, dislocation, or joint effusion. Mild narrowing of medial lateral joint spaces is noted with mild osteophyte formation. Soft tissues are unremarkable. IMPRESSION: Mild degenerative joint disease. No acute abnormality seen in the right knee. Electronically Signed   By: Marijo Conception, M.D.   On: 07/01/2015 09:08   I have personally reviewed and evaluated these images and lab results as part of my medical decision-making.   EKG Interpretation None      MDM   Final diagnoses:  Fall, initial encounter  Knee pain, acute, right    Brittney Reynolds is an 82 year old female with past medical history of polio in childhood, hypertension, and seizures presenting after a fall at home. The patient denies any head trauma or loss of conscious, however is noted on repeat examination to have erythema over the left lateral aspect of her head. Her care taker who accompanies her later notes that she does have significant issues with memory. Knee exam is unremarkable. X-ray revealed only degenerative changes, without any acute abnormalities. Head CT was negative for any acute abnormality, only revealing chronic issues.  No other injuries noted on my physical exam. There are no focal neurologic deficits on my exam.  Patient stable for discharge. Discussed strict return precautions with the patient and her caregiver. They voiced understanding. Patient given the phone number and referral for orthopedics, as she requested injections for her chronic knee pain today in the emergency room, which we do not do.  Archie Patten, MD [facility] Resident  07/01/2015, 10:21 AM   Archie Patten, MD 07/01/15 1021  Elnora Morrison, MD 07/01/15 [phone]

## 2015-07-01 NOTE — Discharge Instructions (Signed)
Ms. Paduano is coming in after a fall this morning. There is no evidence of acute injury to her knee. Because of the bruising on her face, we obtained imaging of her brain. Fortunately we did not see any evidence of bleeding on her brain. If she has any change in her mental status, one-sided weakness or numbness, issues with speech, or issues understanding, please bring her back in for evaluation. For her chronic knee pain, she may benefit from steroid injections as shoe all discussed. We will refer you to orthopedics, Dr. Aline Brochure.  Fall Prevention in the Home  Falls can cause injuries and can affect people from all age groups. There are many simple things that you can do to make your home safe and to help prevent falls. WHAT CAN I DO ON THE OUTSIDE OF MY HOME?  Regularly repair the edges of walkways and driveways and fix any cracks.  Remove high doorway thresholds.  Trim any shrubbery on the main path into your home.  Use bright outdoor lighting.  Clear walkways of debris and clutter, including tools and rocks.  Regularly check that handrails are securely fastened and in good repair. Both sides of any steps should have handrails.  Install guardrails along the edges of any raised decks or porches.  Have leaves, snow, and ice cleared regularly.  Use sand or salt on walkways during winter months.  In the garage, clean up any spills right away, including grease or oil spills. WHAT CAN I DO IN THE BATHROOM?  Use night lights.  Install grab bars by the toilet and in the tub and shower. Do not use towel bars as grab bars.  Use non-skid mats or decals on the floor of the tub or shower.  If you need to sit down while you are in the shower, use a plastic, non-slip stool.Marland Kitchen  Keep the floor dry. Immediately clean up any water that spills on the floor.  Remove soap buildup in the tub or shower on a regular basis.  Attach bath mats securely with double-sided non-slip rug  tape.  Remove throw rugs and other tripping hazards from the floor. WHAT CAN I DO IN THE BEDROOM?  Use night lights.  Make sure that a bedside light is easy to reach.  Do not use oversized bedding that drapes onto the floor.  Have a firm chair that has side arms to use for getting dressed.  Remove throw rugs and other tripping hazards from the floor. WHAT CAN I DO IN THE KITCHEN?   Clean up any spills right away.  Avoid walking on wet floors.  Place frequently used items in easy-to-reach places.  If you need to reach for something above you, use a sturdy step stool that has a grab bar.  Keep electrical cables out of the way.  Do not use floor polish or wax that makes floors slippery. If you have to use wax, make sure that it is non-skid floor wax.  Remove throw rugs and other tripping hazards from the floor. WHAT CAN I DO IN THE STAIRWAYS?  Do not leave any items on the stairs.  Make sure that there are handrails on both sides of the stairs. Fix handrails that are broken or loose. Make sure that handrails are as long as the stairways.  Check any carpeting to make sure that it is firmly attached to the stairs. Fix any carpet that is loose or worn.  Avoid having throw rugs at the top or bottom of stairways, or  secure the rugs with carpet tape to prevent them from moving.  Make sure that you have a light switch at the top of the stairs and the bottom of the stairs. If you do not have them, have them installed. WHAT ARE SOME OTHER FALL PREVENTION TIPS?  Wear closed-toe shoes that fit well and support your feet. Wear shoes that have rubber soles or low heels.  When you use a stepladder, make sure that it is completely opened and that the sides are firmly locked. Have someone hold the ladder while you are using it. Do not climb a closed stepladder.  Add color or contrast paint or tape to grab bars and handrails in your home. Place contrasting color strips on the first and last  steps.  Use mobility aids as needed, such as canes, walkers, scooters, and crutches.  Turn on lights if it is dark. Replace any light bulbs that burn out.  Set up furniture so that there are clear paths. Keep the furniture in the same spot.  Fix any uneven floor surfaces.  Choose a carpet design that does not hide the edge of steps of a stairway.  Be aware of any and all pets.  Review your medicines with your healthcare provider. Some medicines can cause dizziness or changes in blood pressure, which increase your risk of falling. Talk with your health care provider about other ways that you can decrease your risk of falls. This may include working with a physical therapist or trainer to improve your strength, balance, and endurance.   This information is not intended to replace advice given to you by your health care provider. Make sure you discuss any questions you have with your health care provider.   Document Released: 12/24/2001 Document Revised: 05/20/2014 Document Reviewed: 02/07/2014 Elsevier Interactive Patient Education [employer].

## 2015-07-01 NOTE — ED Notes (Addendum)
Per EMS, pt caregiver reported that pt slipped out of bed this am. Pt reports right knee pain with movement and leg cramps ever since. Pt denies pain at this time. nad noted. No deformity noted.

## 2015-07-16 ENCOUNTER — Ambulatory Visit: Payer: Medicare Other | Admitting: Orthopedic Surgery

## 2015-10-02 DIAGNOSIS — I10 Essential (primary) hypertension: Secondary | ICD-10-CM | POA: Diagnosis not present

## 2015-10-02 DIAGNOSIS — F0390 Unspecified dementia without behavioral disturbance: Secondary | ICD-10-CM | POA: Diagnosis not present

## 2015-10-02 DIAGNOSIS — G4730 Sleep apnea, unspecified: Secondary | ICD-10-CM | POA: Diagnosis not present

## 2015-10-02 DIAGNOSIS — G40909 Epilepsy, unspecified, not intractable, without status epilepticus: Secondary | ICD-10-CM | POA: Diagnosis not present

## 2015-10-02 DIAGNOSIS — Z23 Encounter for immunization: Secondary | ICD-10-CM | POA: Diagnosis not present

## 2015-10-02 IMAGING — CR DG ANKLE COMPLETE 3+V*L*
4 series · 4 of 4 positions shown · non-contrast
Comparison: DG FOOT COMPLETE*L* dated 05/25/2013; DG ANKLE
COMPLETE*L* dated 12/16/2009

CLINICAL DATA: pain

EXAM:
LEFT ANKLE COMPLETE - 3+ VIEW

[view not recorded (1 of 4)]
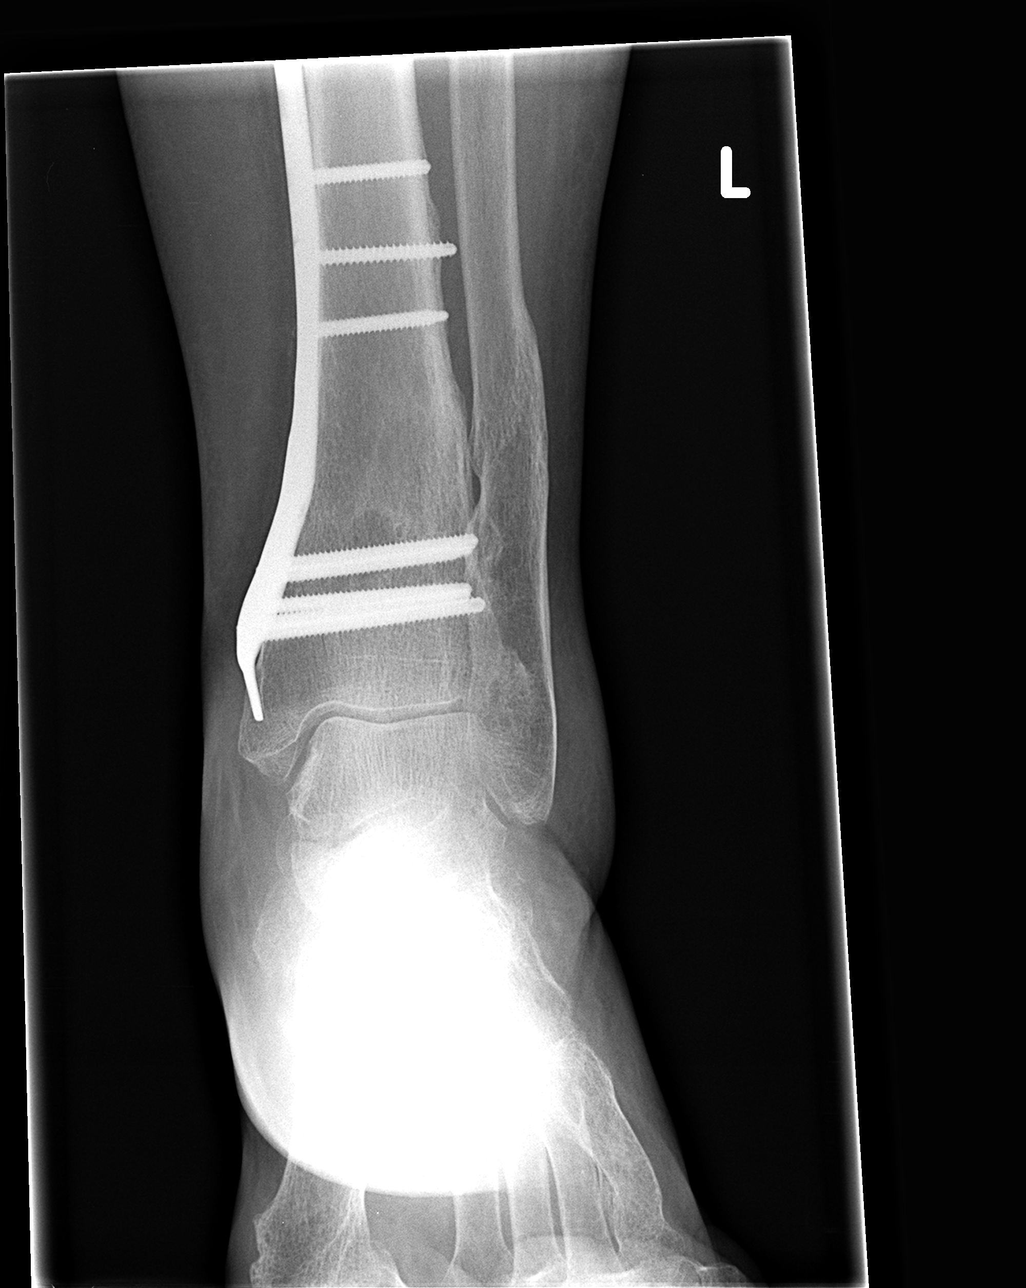

[view not recorded (2 of 4)]
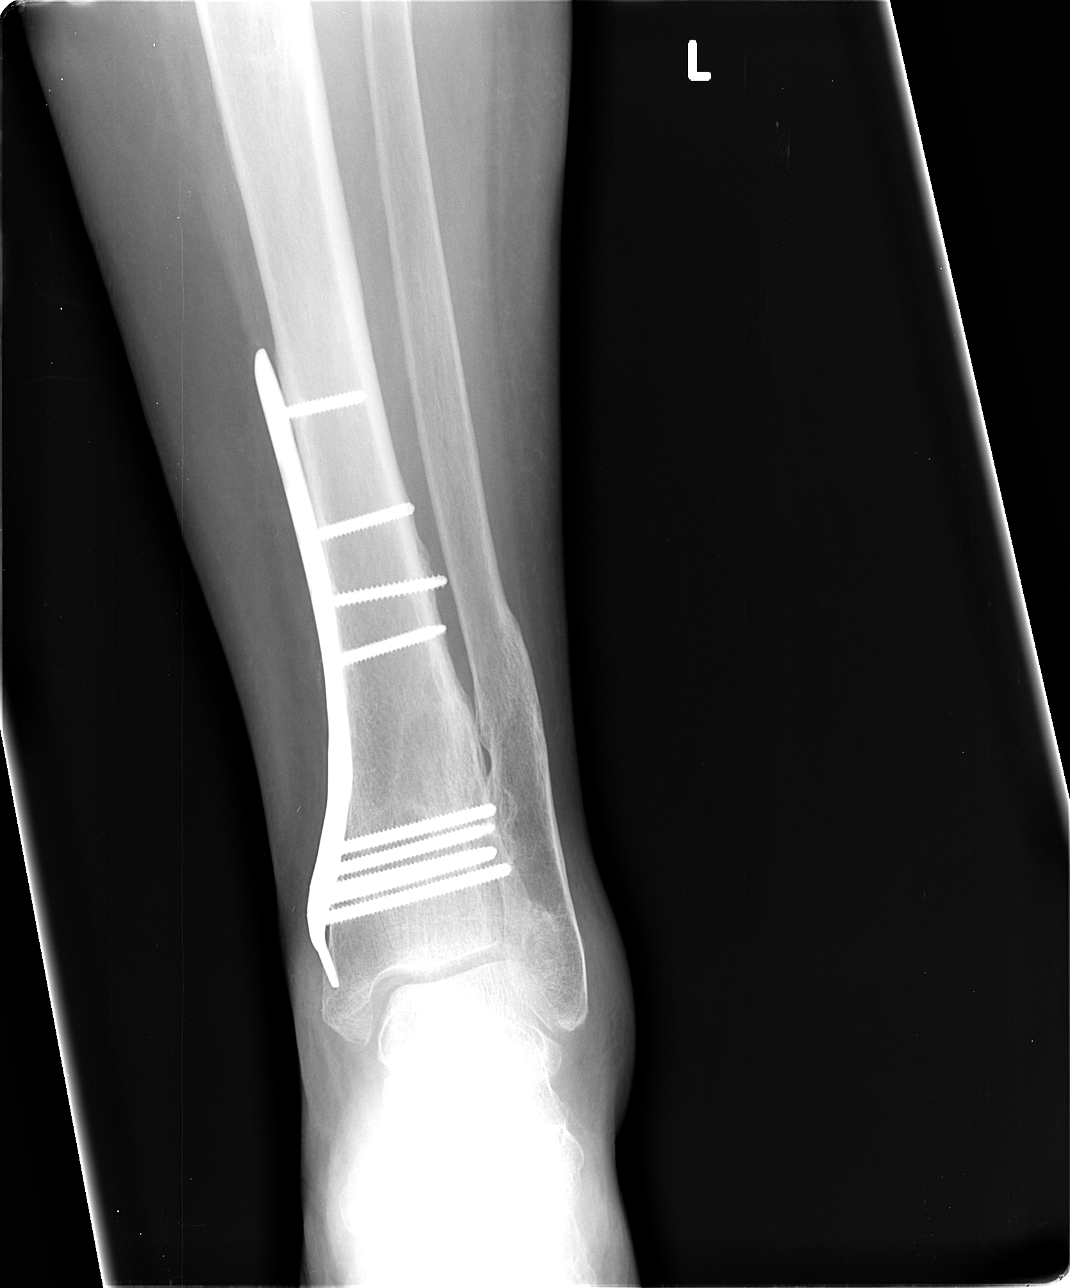

[view not recorded (3 of 4)]
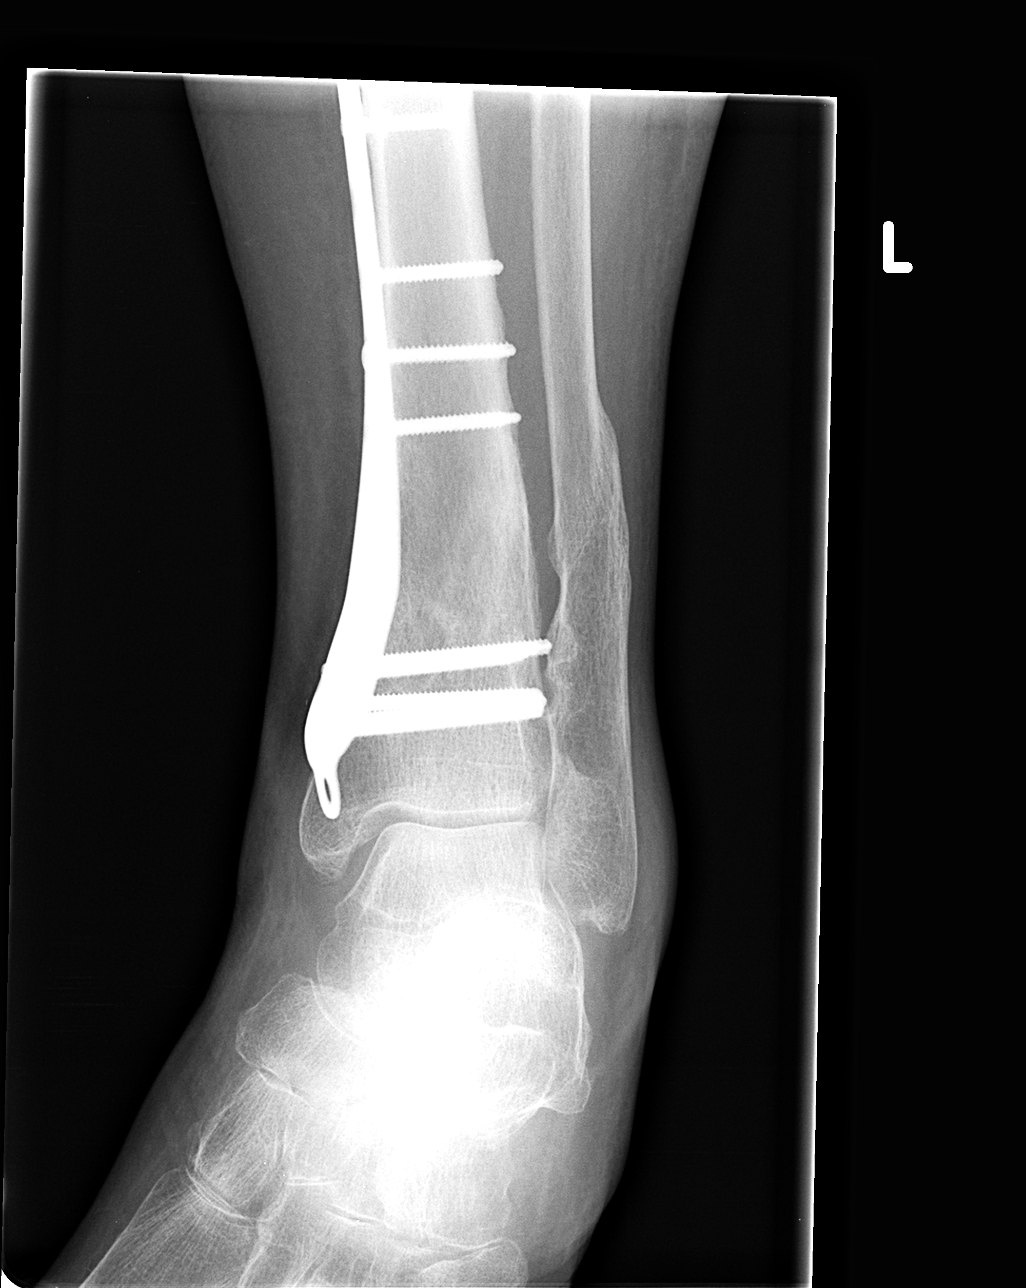

[view not recorded (4 of 4)]
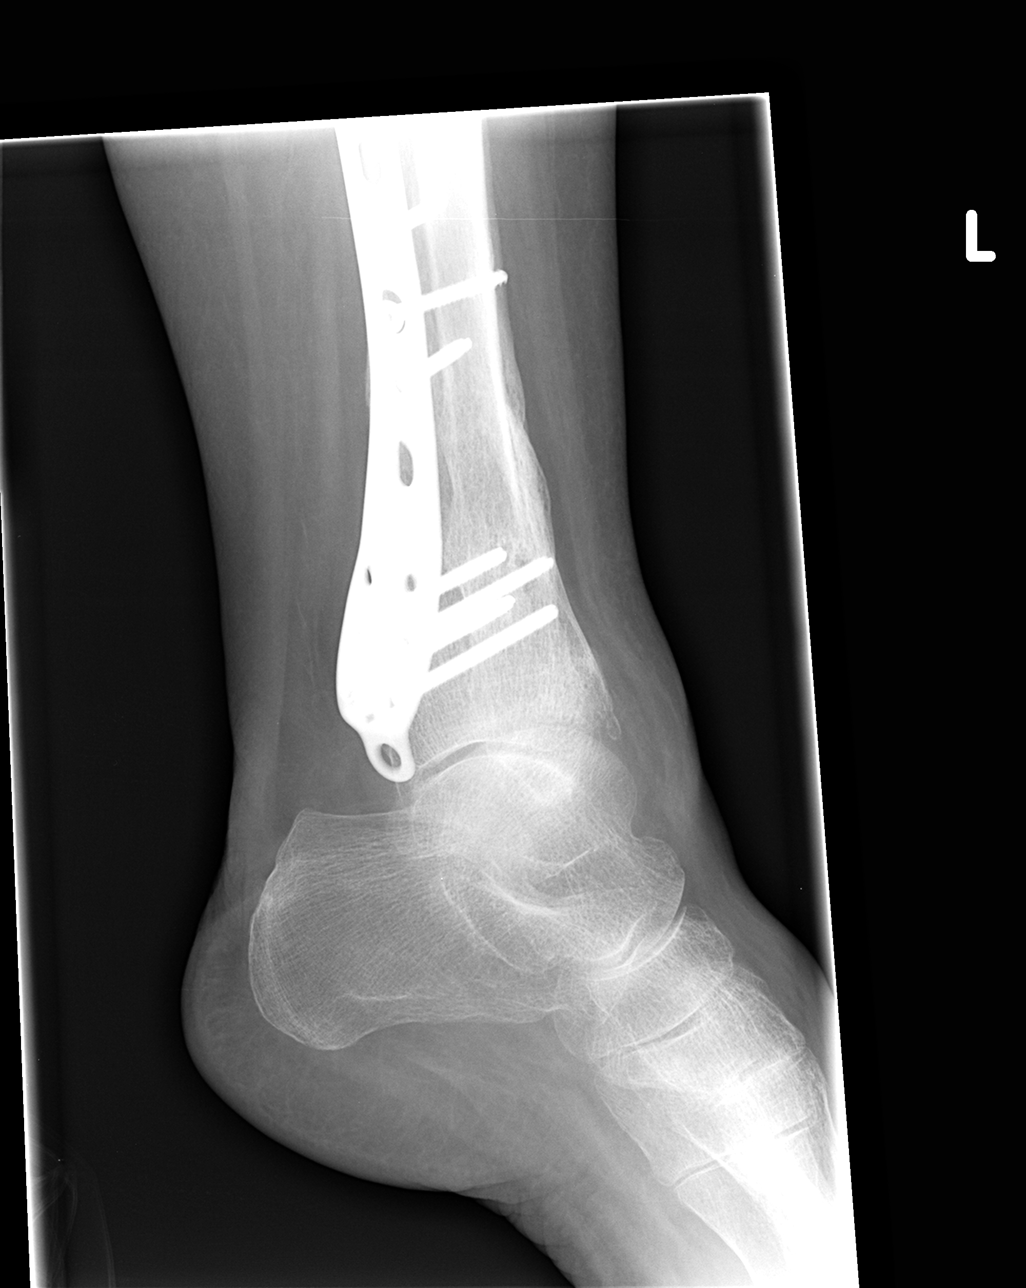

[4 of 4 positions shown; findings below may reference images not displayed]

FINDINGS: Patient is status post open reduction internal fixation distal
tibial fracture. Hardware is intact. There is no interval healing of
the distal fibular fracture. No acute osseous abnormalities.
IMPRESSION: No acute osseous abnormalities. Hardware is intact. Healed distal
tibial and fibular fractures.

## 2015-10-08 ENCOUNTER — Other Ambulatory Visit: Payer: Self-pay

## 2015-10-08 ENCOUNTER — Ambulatory Visit: Payer: Medicare Other | Attending: Pulmonary Disease | Admitting: Neurology

## 2015-10-08 DIAGNOSIS — G4730 Sleep apnea, unspecified: Secondary | ICD-10-CM

## 2015-10-08 DIAGNOSIS — G4733 Obstructive sleep apnea (adult) (pediatric): Secondary | ICD-10-CM | POA: Insufficient documentation

## 2015-10-08 DIAGNOSIS — R0683 Snoring: Secondary | ICD-10-CM | POA: Insufficient documentation

## 2015-10-11 DIAGNOSIS — R296 Repeated falls: Secondary | ICD-10-CM | POA: Diagnosis not present

## 2015-10-11 DIAGNOSIS — M6281 Muscle weakness (generalized): Secondary | ICD-10-CM | POA: Diagnosis not present

## 2015-10-11 DIAGNOSIS — I10 Essential (primary) hypertension: Secondary | ICD-10-CM | POA: Diagnosis not present

## 2015-10-11 DIAGNOSIS — M159 Polyosteoarthritis, unspecified: Secondary | ICD-10-CM | POA: Diagnosis not present

## 2015-10-19 NOTE — Procedures (Signed)
[place] A. Merlene Laughter, MD     [email or web address]             NOCTURNAL POLYSOMNOGRAPHY   LOCATION: [facility]  Patient Name: Brittney, Reynolds Date: 10/08/2015 Gender: Female D.O.B: Sep 23, 1933 Age (years): 57 Referring Provider: Lennox Solders Height (inches): 66 Interpreting Physician: Phillips Odor MD, ABSM Weight (lbs): 170 RPSGT: Peak, Robert BMI: 27 MRN: [id number] Neck Size: 16.00 CLINICAL INFORMATION Sleep Study Type: NPSG Indication for sleep study: N/A Epworth Sleepiness Score: 14 SLEEP STUDY TECHNIQUE As per the AASM Manual for the Scoring of Sleep and Associated Events v2.3 (April 2016) with a hypopnea requiring 4% desaturations. The channels recorded and monitored were frontal, central and occipital EEG, electrooculogram (EOG), submentalis EMG (chin), nasal and oral airflow, thoracic and abdominal wall motion, anterior tibialis EMG, snore microphone, electrocardiogram, and pulse oximetry. MEDICATIONS Patient's medications include: N/A. Medications self-administered by patient during sleep study : No sleep medicine administered.  Current Outpatient Prescriptions:  .  Calcium Carbonate-Vitamin D (CALCIUM + D PO), Take 600 mg by mouth 2 (two) times daily. , Disp: , Rfl:  .  diltiazem (TIAZAC) 360 MG 24 hr capsule, Take 360 mg by mouth daily., Disp: , Rfl:  .  donepezil (ARICEPT) 5 MG tablet, Take 5 mg by mouth at bedtime., Disp: , Rfl:  .  escitalopram (LEXAPRO) 10 MG tablet, Take 10 mg by mouth daily.  , Disp: , Rfl:  .  ferrous sulfate 325 (65 FE) MG tablet, Take 325 mg by mouth 2 (two) times daily with a meal., Disp: , Rfl:  .  levothyroxine (SYNTHROID, LEVOTHROID) 100 MCG tablet, Take 100 mcg by mouth daily before breakfast., Disp: , Rfl:  .  losartan-hydrochlorothiazide (HYZAAR) 100-12.5 MG per tablet, Take 1 tablet by mouth daily., Disp: , Rfl:  .  metoprolol succinate (TOPROL-XL) 25 MG 24 hr tablet, Take 25 mg by mouth every  evening. , Disp: , Rfl:  .  nitrofurantoin, macrocrystal-monohydrate, (MACROBID) 100 MG capsule, Take 1 capsule (100 mg total) by mouth 2 (two) times daily. For 7 days, Disp: 14 capsule, Rfl: 0 .  oxybutynin (DITROPAN-XL) 5 MG 24 hr tablet, Take 5 mg by mouth daily., Disp: , Rfl:  .  pantoprazole (PROTONIX) 40 MG tablet, Take 40 mg by mouth daily., Disp: , Rfl:  .  phenytoin (DILANTIN) 125 MG/5ML suspension, Take 125 mg by mouth every morning., Disp: , Rfl:  .  polyethylene glycol powder (GLYCOLAX/MIRALAX) powder, Take 17 g by mouth daily., Disp: , Rfl:  .  primidone (MYSOLINE) 50 MG tablet, Take 200 mg by mouth 3 (three) times daily., Disp: , Rfl:  .  raloxifene (EVISTA) 60 MG tablet, Take 60 mg by mouth daily.  , Disp: , Rfl:   SLEEP ARCHITECTURE The study was initiated at 9:57:16 PM and ended at 4:07:37 AM. Sleep onset time was 17.7 minutes and the sleep efficiency was 65.6%. The total sleep time was 243.0 minutes. Stage REM latency was 200.0 minutes. The patient spent 16.26% of the night in stage N1 sleep, 53.09% in stage N2 sleep, 2.67% in stage N3 and 27.98% in REM. Alpha intrusion was absent. Supine sleep was 0.21%. RESPIRATORY PARAMETERS The overall apnea/hypopnea index (AHI) was 8.6 per hour. There were 5 total apneas, including 1 obstructive, 4 central and 0 mixed apneas. There were 30 hypopneas and 7 RERAs. The AHI during Stage REM sleep was 2.6 per hour. AHI while supine was 120.0 per hour. The mean oxygen saturation was 86.94%. The minimum SpO2  during sleep was 78.00%. Moderate snoring was noted during this study. CARDIAC DATA The 2 lead EKG demonstrated sinus rhythm. The mean heart rate was 60.37 beats per minute. Other EKG findings include: None. LEG MOVEMENT DATA The total PLMS were 0 with a resulting PLMS index of 0.00. Associated arousal with leg movement index was 0.0.  IMPRESSIONS - Mild obstructive sleep apnea not requiring positive pressure treatment.   Delano Metz, MD Diplomate, American Board of Sleep Medicine.

## 2016-01-05 DIAGNOSIS — I10 Essential (primary) hypertension: Secondary | ICD-10-CM | POA: Diagnosis not present

## 2016-01-05 DIAGNOSIS — F0390 Unspecified dementia without behavioral disturbance: Secondary | ICD-10-CM | POA: Diagnosis not present

## 2016-01-05 DIAGNOSIS — G40909 Epilepsy, unspecified, not intractable, without status epilepticus: Secondary | ICD-10-CM | POA: Diagnosis not present

## 2016-06-08 DIAGNOSIS — Z8781 Personal history of (healed) traumatic fracture: Secondary | ICD-10-CM | POA: Diagnosis not present

## 2016-06-08 DIAGNOSIS — M159 Polyosteoarthritis, unspecified: Secondary | ICD-10-CM | POA: Diagnosis not present

## 2016-06-08 DIAGNOSIS — F329 Major depressive disorder, single episode, unspecified: Secondary | ICD-10-CM | POA: Diagnosis not present

## 2016-06-08 DIAGNOSIS — I10 Essential (primary) hypertension: Secondary | ICD-10-CM | POA: Diagnosis not present

## 2016-06-08 DIAGNOSIS — R413 Other amnesia: Secondary | ICD-10-CM | POA: Diagnosis not present

## 2016-06-08 DIAGNOSIS — M6281 Muscle weakness (generalized): Secondary | ICD-10-CM | POA: Diagnosis not present

## 2016-06-08 DIAGNOSIS — K219 Gastro-esophageal reflux disease without esophagitis: Secondary | ICD-10-CM | POA: Diagnosis not present

## 2016-06-08 DIAGNOSIS — G40909 Epilepsy, unspecified, not intractable, without status epilepticus: Secondary | ICD-10-CM | POA: Diagnosis not present

## 2016-06-08 DIAGNOSIS — M810 Age-related osteoporosis without current pathological fracture: Secondary | ICD-10-CM | POA: Diagnosis not present

## 2016-06-08 DIAGNOSIS — Z9181 History of falling: Secondary | ICD-10-CM | POA: Diagnosis not present

## 2016-06-09 DIAGNOSIS — M159 Polyosteoarthritis, unspecified: Secondary | ICD-10-CM | POA: Diagnosis not present

## 2016-06-09 DIAGNOSIS — M810 Age-related osteoporosis without current pathological fracture: Secondary | ICD-10-CM | POA: Diagnosis not present

## 2016-06-09 DIAGNOSIS — F329 Major depressive disorder, single episode, unspecified: Secondary | ICD-10-CM | POA: Diagnosis not present

## 2016-06-09 DIAGNOSIS — Z9181 History of falling: Secondary | ICD-10-CM | POA: Diagnosis not present

## 2016-06-09 DIAGNOSIS — M6281 Muscle weakness (generalized): Secondary | ICD-10-CM | POA: Diagnosis not present

## 2016-06-09 DIAGNOSIS — G40909 Epilepsy, unspecified, not intractable, without status epilepticus: Secondary | ICD-10-CM | POA: Diagnosis not present

## 2016-06-10 DIAGNOSIS — Z9181 History of falling: Secondary | ICD-10-CM | POA: Diagnosis not present

## 2016-06-10 DIAGNOSIS — M159 Polyosteoarthritis, unspecified: Secondary | ICD-10-CM | POA: Diagnosis not present

## 2016-06-10 DIAGNOSIS — M6281 Muscle weakness (generalized): Secondary | ICD-10-CM | POA: Diagnosis not present

## 2016-06-10 DIAGNOSIS — G40909 Epilepsy, unspecified, not intractable, without status epilepticus: Secondary | ICD-10-CM | POA: Diagnosis not present

## 2016-06-10 DIAGNOSIS — M810 Age-related osteoporosis without current pathological fracture: Secondary | ICD-10-CM | POA: Diagnosis not present

## 2016-06-10 DIAGNOSIS — F329 Major depressive disorder, single episode, unspecified: Secondary | ICD-10-CM | POA: Diagnosis not present

## 2016-06-15 DIAGNOSIS — F329 Major depressive disorder, single episode, unspecified: Secondary | ICD-10-CM | POA: Diagnosis not present

## 2016-06-15 DIAGNOSIS — M6281 Muscle weakness (generalized): Secondary | ICD-10-CM | POA: Diagnosis not present

## 2016-06-15 DIAGNOSIS — M810 Age-related osteoporosis without current pathological fracture: Secondary | ICD-10-CM | POA: Diagnosis not present

## 2016-06-15 DIAGNOSIS — Z9181 History of falling: Secondary | ICD-10-CM | POA: Diagnosis not present

## 2016-06-15 DIAGNOSIS — M159 Polyosteoarthritis, unspecified: Secondary | ICD-10-CM | POA: Diagnosis not present

## 2016-06-15 DIAGNOSIS — G40909 Epilepsy, unspecified, not intractable, without status epilepticus: Secondary | ICD-10-CM | POA: Diagnosis not present

## 2016-06-16 DIAGNOSIS — Z9181 History of falling: Secondary | ICD-10-CM | POA: Diagnosis not present

## 2016-06-16 DIAGNOSIS — M810 Age-related osteoporosis without current pathological fracture: Secondary | ICD-10-CM | POA: Diagnosis not present

## 2016-06-16 DIAGNOSIS — M6281 Muscle weakness (generalized): Secondary | ICD-10-CM | POA: Diagnosis not present

## 2016-06-16 DIAGNOSIS — G40909 Epilepsy, unspecified, not intractable, without status epilepticus: Secondary | ICD-10-CM | POA: Diagnosis not present

## 2016-06-16 DIAGNOSIS — M159 Polyosteoarthritis, unspecified: Secondary | ICD-10-CM | POA: Diagnosis not present

## 2016-06-16 DIAGNOSIS — F329 Major depressive disorder, single episode, unspecified: Secondary | ICD-10-CM | POA: Diagnosis not present

## 2016-06-20 DIAGNOSIS — Z9181 History of falling: Secondary | ICD-10-CM | POA: Diagnosis not present

## 2016-06-20 DIAGNOSIS — F329 Major depressive disorder, single episode, unspecified: Secondary | ICD-10-CM | POA: Diagnosis not present

## 2016-06-20 DIAGNOSIS — M159 Polyosteoarthritis, unspecified: Secondary | ICD-10-CM | POA: Diagnosis not present

## 2016-06-20 DIAGNOSIS — M6281 Muscle weakness (generalized): Secondary | ICD-10-CM | POA: Diagnosis not present

## 2016-06-20 DIAGNOSIS — M810 Age-related osteoporosis without current pathological fracture: Secondary | ICD-10-CM | POA: Diagnosis not present

## 2016-06-20 DIAGNOSIS — G40909 Epilepsy, unspecified, not intractable, without status epilepticus: Secondary | ICD-10-CM | POA: Diagnosis not present

## 2016-06-22 DIAGNOSIS — Z9181 History of falling: Secondary | ICD-10-CM | POA: Diagnosis not present

## 2016-06-22 DIAGNOSIS — F329 Major depressive disorder, single episode, unspecified: Secondary | ICD-10-CM | POA: Diagnosis not present

## 2016-06-22 DIAGNOSIS — M6281 Muscle weakness (generalized): Secondary | ICD-10-CM | POA: Diagnosis not present

## 2016-06-22 DIAGNOSIS — G40909 Epilepsy, unspecified, not intractable, without status epilepticus: Secondary | ICD-10-CM | POA: Diagnosis not present

## 2016-06-22 DIAGNOSIS — M810 Age-related osteoporosis without current pathological fracture: Secondary | ICD-10-CM | POA: Diagnosis not present

## 2016-06-22 DIAGNOSIS — M159 Polyosteoarthritis, unspecified: Secondary | ICD-10-CM | POA: Diagnosis not present

## 2016-06-27 DIAGNOSIS — G40909 Epilepsy, unspecified, not intractable, without status epilepticus: Secondary | ICD-10-CM | POA: Diagnosis not present

## 2016-06-27 DIAGNOSIS — M810 Age-related osteoporosis without current pathological fracture: Secondary | ICD-10-CM | POA: Diagnosis not present

## 2016-06-27 DIAGNOSIS — Z9181 History of falling: Secondary | ICD-10-CM | POA: Diagnosis not present

## 2016-06-27 DIAGNOSIS — M159 Polyosteoarthritis, unspecified: Secondary | ICD-10-CM | POA: Diagnosis not present

## 2016-06-27 DIAGNOSIS — F329 Major depressive disorder, single episode, unspecified: Secondary | ICD-10-CM | POA: Diagnosis not present

## 2016-06-27 DIAGNOSIS — M6281 Muscle weakness (generalized): Secondary | ICD-10-CM | POA: Diagnosis not present

## 2016-06-29 DIAGNOSIS — M810 Age-related osteoporosis without current pathological fracture: Secondary | ICD-10-CM | POA: Diagnosis not present

## 2016-06-29 DIAGNOSIS — M6281 Muscle weakness (generalized): Secondary | ICD-10-CM | POA: Diagnosis not present

## 2016-06-29 DIAGNOSIS — G40909 Epilepsy, unspecified, not intractable, without status epilepticus: Secondary | ICD-10-CM | POA: Diagnosis not present

## 2016-06-29 DIAGNOSIS — M159 Polyosteoarthritis, unspecified: Secondary | ICD-10-CM | POA: Diagnosis not present

## 2016-06-29 DIAGNOSIS — Z9181 History of falling: Secondary | ICD-10-CM | POA: Diagnosis not present

## 2016-06-29 DIAGNOSIS — F329 Major depressive disorder, single episode, unspecified: Secondary | ICD-10-CM | POA: Diagnosis not present

## 2016-07-06 DIAGNOSIS — Z9181 History of falling: Secondary | ICD-10-CM | POA: Diagnosis not present

## 2016-07-06 DIAGNOSIS — G40909 Epilepsy, unspecified, not intractable, without status epilepticus: Secondary | ICD-10-CM | POA: Diagnosis not present

## 2016-07-06 DIAGNOSIS — M6281 Muscle weakness (generalized): Secondary | ICD-10-CM | POA: Diagnosis not present

## 2016-07-06 DIAGNOSIS — M810 Age-related osteoporosis without current pathological fracture: Secondary | ICD-10-CM | POA: Diagnosis not present

## 2016-07-06 DIAGNOSIS — M159 Polyosteoarthritis, unspecified: Secondary | ICD-10-CM | POA: Diagnosis not present

## 2016-07-06 DIAGNOSIS — F329 Major depressive disorder, single episode, unspecified: Secondary | ICD-10-CM | POA: Diagnosis not present

## 2016-07-07 DIAGNOSIS — Z9181 History of falling: Secondary | ICD-10-CM | POA: Diagnosis not present

## 2016-07-07 DIAGNOSIS — M810 Age-related osteoporosis without current pathological fracture: Secondary | ICD-10-CM | POA: Diagnosis not present

## 2016-07-07 DIAGNOSIS — M159 Polyosteoarthritis, unspecified: Secondary | ICD-10-CM | POA: Diagnosis not present

## 2016-07-07 DIAGNOSIS — G40909 Epilepsy, unspecified, not intractable, without status epilepticus: Secondary | ICD-10-CM | POA: Diagnosis not present

## 2016-07-07 DIAGNOSIS — M6281 Muscle weakness (generalized): Secondary | ICD-10-CM | POA: Diagnosis not present

## 2016-07-07 DIAGNOSIS — F329 Major depressive disorder, single episode, unspecified: Secondary | ICD-10-CM | POA: Diagnosis not present

## 2016-07-13 DIAGNOSIS — M810 Age-related osteoporosis without current pathological fracture: Secondary | ICD-10-CM | POA: Diagnosis not present

## 2016-07-13 DIAGNOSIS — Z9181 History of falling: Secondary | ICD-10-CM | POA: Diagnosis not present

## 2016-07-13 DIAGNOSIS — G40909 Epilepsy, unspecified, not intractable, without status epilepticus: Secondary | ICD-10-CM | POA: Diagnosis not present

## 2016-07-13 DIAGNOSIS — M159 Polyosteoarthritis, unspecified: Secondary | ICD-10-CM | POA: Diagnosis not present

## 2016-07-13 DIAGNOSIS — F329 Major depressive disorder, single episode, unspecified: Secondary | ICD-10-CM | POA: Diagnosis not present

## 2016-07-13 DIAGNOSIS — M6281 Muscle weakness (generalized): Secondary | ICD-10-CM | POA: Diagnosis not present

## 2016-07-15 DIAGNOSIS — Z9181 History of falling: Secondary | ICD-10-CM | POA: Diagnosis not present

## 2016-07-15 DIAGNOSIS — F329 Major depressive disorder, single episode, unspecified: Secondary | ICD-10-CM | POA: Diagnosis not present

## 2016-07-15 DIAGNOSIS — M810 Age-related osteoporosis without current pathological fracture: Secondary | ICD-10-CM | POA: Diagnosis not present

## 2016-07-15 DIAGNOSIS — M6281 Muscle weakness (generalized): Secondary | ICD-10-CM | POA: Diagnosis not present

## 2016-07-15 DIAGNOSIS — M159 Polyosteoarthritis, unspecified: Secondary | ICD-10-CM | POA: Diagnosis not present

## 2016-07-15 DIAGNOSIS — G40909 Epilepsy, unspecified, not intractable, without status epilepticus: Secondary | ICD-10-CM | POA: Diagnosis not present

## 2016-07-18 DIAGNOSIS — Z9181 History of falling: Secondary | ICD-10-CM | POA: Diagnosis not present

## 2016-07-18 DIAGNOSIS — M810 Age-related osteoporosis without current pathological fracture: Secondary | ICD-10-CM | POA: Diagnosis not present

## 2016-07-18 DIAGNOSIS — F329 Major depressive disorder, single episode, unspecified: Secondary | ICD-10-CM | POA: Diagnosis not present

## 2016-07-18 DIAGNOSIS — M6281 Muscle weakness (generalized): Secondary | ICD-10-CM | POA: Diagnosis not present

## 2016-07-18 DIAGNOSIS — M159 Polyosteoarthritis, unspecified: Secondary | ICD-10-CM | POA: Diagnosis not present

## 2016-07-18 DIAGNOSIS — G40909 Epilepsy, unspecified, not intractable, without status epilepticus: Secondary | ICD-10-CM | POA: Diagnosis not present

## 2016-07-21 DIAGNOSIS — Z9181 History of falling: Secondary | ICD-10-CM | POA: Diagnosis not present

## 2016-07-21 DIAGNOSIS — M159 Polyosteoarthritis, unspecified: Secondary | ICD-10-CM | POA: Diagnosis not present

## 2016-07-21 DIAGNOSIS — G40909 Epilepsy, unspecified, not intractable, without status epilepticus: Secondary | ICD-10-CM | POA: Diagnosis not present

## 2016-07-21 DIAGNOSIS — F329 Major depressive disorder, single episode, unspecified: Secondary | ICD-10-CM | POA: Diagnosis not present

## 2016-07-21 DIAGNOSIS — M810 Age-related osteoporosis without current pathological fracture: Secondary | ICD-10-CM | POA: Diagnosis not present

## 2016-07-21 DIAGNOSIS — M6281 Muscle weakness (generalized): Secondary | ICD-10-CM | POA: Diagnosis not present

## 2016-08-23 DIAGNOSIS — K210 Gastro-esophageal reflux disease with esophagitis: Secondary | ICD-10-CM | POA: Diagnosis not present

## 2016-08-23 DIAGNOSIS — I10 Essential (primary) hypertension: Secondary | ICD-10-CM | POA: Diagnosis not present

## 2016-08-23 DIAGNOSIS — F0390 Unspecified dementia without behavioral disturbance: Secondary | ICD-10-CM | POA: Diagnosis not present

## 2016-08-23 DIAGNOSIS — G40909 Epilepsy, unspecified, not intractable, without status epilepticus: Secondary | ICD-10-CM | POA: Diagnosis not present

## 2016-09-06 DIAGNOSIS — I10 Essential (primary) hypertension: Secondary | ICD-10-CM | POA: Diagnosis not present

## 2016-09-06 DIAGNOSIS — H6120 Impacted cerumen, unspecified ear: Secondary | ICD-10-CM | POA: Diagnosis not present

## 2016-11-15 DIAGNOSIS — F0390 Unspecified dementia without behavioral disturbance: Secondary | ICD-10-CM | POA: Diagnosis not present

## 2016-11-15 DIAGNOSIS — I10 Essential (primary) hypertension: Secondary | ICD-10-CM | POA: Diagnosis not present

## 2016-11-15 DIAGNOSIS — Z23 Encounter for immunization: Secondary | ICD-10-CM | POA: Diagnosis not present

## 2016-11-15 DIAGNOSIS — K210 Gastro-esophageal reflux disease with esophagitis: Secondary | ICD-10-CM | POA: Diagnosis not present

## 2016-11-15 DIAGNOSIS — G40909 Epilepsy, unspecified, not intractable, without status epilepticus: Secondary | ICD-10-CM | POA: Diagnosis not present

## 2017-01-24 DIAGNOSIS — M79605 Pain in left leg: Secondary | ICD-10-CM | POA: Diagnosis not present

## 2017-01-24 DIAGNOSIS — G40909 Epilepsy, unspecified, not intractable, without status epilepticus: Secondary | ICD-10-CM | POA: Diagnosis not present

## 2017-01-24 DIAGNOSIS — Z7951 Long term (current) use of inhaled steroids: Secondary | ICD-10-CM | POA: Diagnosis not present

## 2017-01-24 DIAGNOSIS — F0390 Unspecified dementia without behavioral disturbance: Secondary | ICD-10-CM | POA: Diagnosis not present

## 2017-01-24 DIAGNOSIS — M79604 Pain in right leg: Secondary | ICD-10-CM | POA: Diagnosis not present

## 2017-01-24 DIAGNOSIS — M6281 Muscle weakness (generalized): Secondary | ICD-10-CM | POA: Diagnosis not present

## 2017-01-24 DIAGNOSIS — K219 Gastro-esophageal reflux disease without esophagitis: Secondary | ICD-10-CM | POA: Diagnosis not present

## 2017-01-24 DIAGNOSIS — M159 Polyosteoarthritis, unspecified: Secondary | ICD-10-CM | POA: Diagnosis not present

## 2017-01-24 DIAGNOSIS — G8929 Other chronic pain: Secondary | ICD-10-CM | POA: Diagnosis not present

## 2017-01-24 DIAGNOSIS — Z9181 History of falling: Secondary | ICD-10-CM | POA: Diagnosis not present

## 2017-01-24 DIAGNOSIS — F329 Major depressive disorder, single episode, unspecified: Secondary | ICD-10-CM | POA: Diagnosis not present

## 2017-01-24 DIAGNOSIS — G4730 Sleep apnea, unspecified: Secondary | ICD-10-CM | POA: Diagnosis not present

## 2017-01-25 DIAGNOSIS — M159 Polyosteoarthritis, unspecified: Secondary | ICD-10-CM | POA: Diagnosis not present

## 2017-01-25 DIAGNOSIS — M79604 Pain in right leg: Secondary | ICD-10-CM | POA: Diagnosis not present

## 2017-01-25 DIAGNOSIS — F0390 Unspecified dementia without behavioral disturbance: Secondary | ICD-10-CM | POA: Diagnosis not present

## 2017-01-25 DIAGNOSIS — M6281 Muscle weakness (generalized): Secondary | ICD-10-CM | POA: Diagnosis not present

## 2017-01-25 DIAGNOSIS — G8929 Other chronic pain: Secondary | ICD-10-CM | POA: Diagnosis not present

## 2017-01-25 DIAGNOSIS — M79605 Pain in left leg: Secondary | ICD-10-CM | POA: Diagnosis not present

## 2017-01-26 DIAGNOSIS — G8929 Other chronic pain: Secondary | ICD-10-CM | POA: Diagnosis not present

## 2017-01-26 DIAGNOSIS — M6281 Muscle weakness (generalized): Secondary | ICD-10-CM | POA: Diagnosis not present

## 2017-01-26 DIAGNOSIS — F0390 Unspecified dementia without behavioral disturbance: Secondary | ICD-10-CM | POA: Diagnosis not present

## 2017-01-26 DIAGNOSIS — M79605 Pain in left leg: Secondary | ICD-10-CM | POA: Diagnosis not present

## 2017-01-26 DIAGNOSIS — M159 Polyosteoarthritis, unspecified: Secondary | ICD-10-CM | POA: Diagnosis not present

## 2017-01-26 DIAGNOSIS — M79604 Pain in right leg: Secondary | ICD-10-CM | POA: Diagnosis not present

## 2017-01-31 DIAGNOSIS — M159 Polyosteoarthritis, unspecified: Secondary | ICD-10-CM | POA: Diagnosis not present

## 2017-01-31 DIAGNOSIS — F0390 Unspecified dementia without behavioral disturbance: Secondary | ICD-10-CM | POA: Diagnosis not present

## 2017-01-31 DIAGNOSIS — G8929 Other chronic pain: Secondary | ICD-10-CM | POA: Diagnosis not present

## 2017-01-31 DIAGNOSIS — M79605 Pain in left leg: Secondary | ICD-10-CM | POA: Diagnosis not present

## 2017-01-31 DIAGNOSIS — M79604 Pain in right leg: Secondary | ICD-10-CM | POA: Diagnosis not present

## 2017-01-31 DIAGNOSIS — M6281 Muscle weakness (generalized): Secondary | ICD-10-CM | POA: Diagnosis not present

## 2017-02-02 DIAGNOSIS — M79605 Pain in left leg: Secondary | ICD-10-CM | POA: Diagnosis not present

## 2017-02-02 DIAGNOSIS — M79604 Pain in right leg: Secondary | ICD-10-CM | POA: Diagnosis not present

## 2017-02-02 DIAGNOSIS — M6281 Muscle weakness (generalized): Secondary | ICD-10-CM | POA: Diagnosis not present

## 2017-02-02 DIAGNOSIS — F0390 Unspecified dementia without behavioral disturbance: Secondary | ICD-10-CM | POA: Diagnosis not present

## 2017-02-02 DIAGNOSIS — G8929 Other chronic pain: Secondary | ICD-10-CM | POA: Diagnosis not present

## 2017-02-02 DIAGNOSIS — M159 Polyosteoarthritis, unspecified: Secondary | ICD-10-CM | POA: Diagnosis not present

## 2017-02-03 DIAGNOSIS — M79605 Pain in left leg: Secondary | ICD-10-CM | POA: Diagnosis not present

## 2017-02-03 DIAGNOSIS — G8929 Other chronic pain: Secondary | ICD-10-CM | POA: Diagnosis not present

## 2017-02-03 DIAGNOSIS — F0390 Unspecified dementia without behavioral disturbance: Secondary | ICD-10-CM | POA: Diagnosis not present

## 2017-02-03 DIAGNOSIS — M6281 Muscle weakness (generalized): Secondary | ICD-10-CM | POA: Diagnosis not present

## 2017-02-03 DIAGNOSIS — M159 Polyosteoarthritis, unspecified: Secondary | ICD-10-CM | POA: Diagnosis not present

## 2017-02-03 DIAGNOSIS — M79604 Pain in right leg: Secondary | ICD-10-CM | POA: Diagnosis not present

## 2017-02-06 DIAGNOSIS — F0390 Unspecified dementia without behavioral disturbance: Secondary | ICD-10-CM | POA: Diagnosis not present

## 2017-02-06 DIAGNOSIS — M159 Polyosteoarthritis, unspecified: Secondary | ICD-10-CM | POA: Diagnosis not present

## 2017-02-06 DIAGNOSIS — M79605 Pain in left leg: Secondary | ICD-10-CM | POA: Diagnosis not present

## 2017-02-06 DIAGNOSIS — G8929 Other chronic pain: Secondary | ICD-10-CM | POA: Diagnosis not present

## 2017-02-06 DIAGNOSIS — M6281 Muscle weakness (generalized): Secondary | ICD-10-CM | POA: Diagnosis not present

## 2017-02-06 DIAGNOSIS — M79604 Pain in right leg: Secondary | ICD-10-CM | POA: Diagnosis not present

## 2017-02-07 DIAGNOSIS — M159 Polyosteoarthritis, unspecified: Secondary | ICD-10-CM | POA: Diagnosis not present

## 2017-02-07 DIAGNOSIS — M6281 Muscle weakness (generalized): Secondary | ICD-10-CM | POA: Diagnosis not present

## 2017-02-07 DIAGNOSIS — G8929 Other chronic pain: Secondary | ICD-10-CM | POA: Diagnosis not present

## 2017-02-07 DIAGNOSIS — F0390 Unspecified dementia without behavioral disturbance: Secondary | ICD-10-CM | POA: Diagnosis not present

## 2017-02-07 DIAGNOSIS — M79604 Pain in right leg: Secondary | ICD-10-CM | POA: Diagnosis not present

## 2017-02-07 DIAGNOSIS — M79605 Pain in left leg: Secondary | ICD-10-CM | POA: Diagnosis not present

## 2017-02-09 DIAGNOSIS — F0390 Unspecified dementia without behavioral disturbance: Secondary | ICD-10-CM | POA: Diagnosis not present

## 2017-02-09 DIAGNOSIS — M79604 Pain in right leg: Secondary | ICD-10-CM | POA: Diagnosis not present

## 2017-02-09 DIAGNOSIS — M159 Polyosteoarthritis, unspecified: Secondary | ICD-10-CM | POA: Diagnosis not present

## 2017-02-09 DIAGNOSIS — G8929 Other chronic pain: Secondary | ICD-10-CM | POA: Diagnosis not present

## 2017-02-09 DIAGNOSIS — M6281 Muscle weakness (generalized): Secondary | ICD-10-CM | POA: Diagnosis not present

## 2017-02-09 DIAGNOSIS — M79605 Pain in left leg: Secondary | ICD-10-CM | POA: Diagnosis not present

## 2017-02-14 DIAGNOSIS — M6281 Muscle weakness (generalized): Secondary | ICD-10-CM | POA: Diagnosis not present

## 2017-02-14 DIAGNOSIS — M79605 Pain in left leg: Secondary | ICD-10-CM | POA: Diagnosis not present

## 2017-02-14 DIAGNOSIS — M79604 Pain in right leg: Secondary | ICD-10-CM | POA: Diagnosis not present

## 2017-02-14 DIAGNOSIS — M159 Polyosteoarthritis, unspecified: Secondary | ICD-10-CM | POA: Diagnosis not present

## 2017-02-14 DIAGNOSIS — G8929 Other chronic pain: Secondary | ICD-10-CM | POA: Diagnosis not present

## 2017-02-14 DIAGNOSIS — F0390 Unspecified dementia without behavioral disturbance: Secondary | ICD-10-CM | POA: Diagnosis not present

## 2017-02-16 DIAGNOSIS — G8929 Other chronic pain: Secondary | ICD-10-CM | POA: Diagnosis not present

## 2017-02-16 DIAGNOSIS — M79605 Pain in left leg: Secondary | ICD-10-CM | POA: Diagnosis not present

## 2017-02-16 DIAGNOSIS — M79604 Pain in right leg: Secondary | ICD-10-CM | POA: Diagnosis not present

## 2017-02-16 DIAGNOSIS — M6281 Muscle weakness (generalized): Secondary | ICD-10-CM | POA: Diagnosis not present

## 2017-02-16 DIAGNOSIS — M159 Polyosteoarthritis, unspecified: Secondary | ICD-10-CM | POA: Diagnosis not present

## 2017-02-16 DIAGNOSIS — F0390 Unspecified dementia without behavioral disturbance: Secondary | ICD-10-CM | POA: Diagnosis not present

## 2017-02-22 DIAGNOSIS — M159 Polyosteoarthritis, unspecified: Secondary | ICD-10-CM | POA: Diagnosis not present

## 2017-02-22 DIAGNOSIS — F0390 Unspecified dementia without behavioral disturbance: Secondary | ICD-10-CM | POA: Diagnosis not present

## 2017-02-22 DIAGNOSIS — G8929 Other chronic pain: Secondary | ICD-10-CM | POA: Diagnosis not present

## 2017-02-22 DIAGNOSIS — M79605 Pain in left leg: Secondary | ICD-10-CM | POA: Diagnosis not present

## 2017-02-22 DIAGNOSIS — M6281 Muscle weakness (generalized): Secondary | ICD-10-CM | POA: Diagnosis not present

## 2017-02-22 DIAGNOSIS — M79604 Pain in right leg: Secondary | ICD-10-CM | POA: Diagnosis not present

## 2017-02-24 DIAGNOSIS — M159 Polyosteoarthritis, unspecified: Secondary | ICD-10-CM | POA: Diagnosis not present

## 2017-02-24 DIAGNOSIS — M79604 Pain in right leg: Secondary | ICD-10-CM | POA: Diagnosis not present

## 2017-02-24 DIAGNOSIS — G8929 Other chronic pain: Secondary | ICD-10-CM | POA: Diagnosis not present

## 2017-02-24 DIAGNOSIS — M6281 Muscle weakness (generalized): Secondary | ICD-10-CM | POA: Diagnosis not present

## 2017-02-24 DIAGNOSIS — F0390 Unspecified dementia without behavioral disturbance: Secondary | ICD-10-CM | POA: Diagnosis not present

## 2017-02-24 DIAGNOSIS — M79605 Pain in left leg: Secondary | ICD-10-CM | POA: Diagnosis not present

## 2017-03-01 DIAGNOSIS — M79604 Pain in right leg: Secondary | ICD-10-CM | POA: Diagnosis not present

## 2017-03-01 DIAGNOSIS — F0390 Unspecified dementia without behavioral disturbance: Secondary | ICD-10-CM | POA: Diagnosis not present

## 2017-03-01 DIAGNOSIS — M159 Polyosteoarthritis, unspecified: Secondary | ICD-10-CM | POA: Diagnosis not present

## 2017-03-01 DIAGNOSIS — G8929 Other chronic pain: Secondary | ICD-10-CM | POA: Diagnosis not present

## 2017-03-01 DIAGNOSIS — M79605 Pain in left leg: Secondary | ICD-10-CM | POA: Diagnosis not present

## 2017-03-01 DIAGNOSIS — M6281 Muscle weakness (generalized): Secondary | ICD-10-CM | POA: Diagnosis not present

## 2017-03-06 DIAGNOSIS — M79604 Pain in right leg: Secondary | ICD-10-CM | POA: Diagnosis not present

## 2017-03-06 DIAGNOSIS — F0390 Unspecified dementia without behavioral disturbance: Secondary | ICD-10-CM | POA: Diagnosis not present

## 2017-03-06 DIAGNOSIS — M159 Polyosteoarthritis, unspecified: Secondary | ICD-10-CM | POA: Diagnosis not present

## 2017-03-06 DIAGNOSIS — M6281 Muscle weakness (generalized): Secondary | ICD-10-CM | POA: Diagnosis not present

## 2017-03-06 DIAGNOSIS — G8929 Other chronic pain: Secondary | ICD-10-CM | POA: Diagnosis not present

## 2017-03-06 DIAGNOSIS — M79605 Pain in left leg: Secondary | ICD-10-CM | POA: Diagnosis not present

## 2017-03-09 DIAGNOSIS — M159 Polyosteoarthritis, unspecified: Secondary | ICD-10-CM | POA: Diagnosis not present

## 2017-03-09 DIAGNOSIS — G8929 Other chronic pain: Secondary | ICD-10-CM | POA: Diagnosis not present

## 2017-03-09 DIAGNOSIS — M79604 Pain in right leg: Secondary | ICD-10-CM | POA: Diagnosis not present

## 2017-03-09 DIAGNOSIS — F0390 Unspecified dementia without behavioral disturbance: Secondary | ICD-10-CM | POA: Diagnosis not present

## 2017-03-09 DIAGNOSIS — M79605 Pain in left leg: Secondary | ICD-10-CM | POA: Diagnosis not present

## 2017-03-09 DIAGNOSIS — M6281 Muscle weakness (generalized): Secondary | ICD-10-CM | POA: Diagnosis not present

## 2017-03-16 DIAGNOSIS — M6281 Muscle weakness (generalized): Secondary | ICD-10-CM | POA: Diagnosis not present

## 2017-03-16 DIAGNOSIS — G8929 Other chronic pain: Secondary | ICD-10-CM | POA: Diagnosis not present

## 2017-03-16 DIAGNOSIS — F0390 Unspecified dementia without behavioral disturbance: Secondary | ICD-10-CM | POA: Diagnosis not present

## 2017-03-16 DIAGNOSIS — M79605 Pain in left leg: Secondary | ICD-10-CM | POA: Diagnosis not present

## 2017-03-16 DIAGNOSIS — M79604 Pain in right leg: Secondary | ICD-10-CM | POA: Diagnosis not present

## 2017-03-16 DIAGNOSIS — M159 Polyosteoarthritis, unspecified: Secondary | ICD-10-CM | POA: Diagnosis not present

## 2017-03-17 DIAGNOSIS — M79604 Pain in right leg: Secondary | ICD-10-CM | POA: Diagnosis not present

## 2017-03-17 DIAGNOSIS — M79605 Pain in left leg: Secondary | ICD-10-CM | POA: Diagnosis not present

## 2017-03-17 DIAGNOSIS — M6281 Muscle weakness (generalized): Secondary | ICD-10-CM | POA: Diagnosis not present

## 2017-03-17 DIAGNOSIS — F0390 Unspecified dementia without behavioral disturbance: Secondary | ICD-10-CM | POA: Diagnosis not present

## 2017-03-17 DIAGNOSIS — G8929 Other chronic pain: Secondary | ICD-10-CM | POA: Diagnosis not present

## 2017-03-17 DIAGNOSIS — M159 Polyosteoarthritis, unspecified: Secondary | ICD-10-CM | POA: Diagnosis not present

## 2017-04-18 DIAGNOSIS — F0390 Unspecified dementia without behavioral disturbance: Secondary | ICD-10-CM | POA: Diagnosis not present

## 2017-04-18 DIAGNOSIS — G40909 Epilepsy, unspecified, not intractable, without status epilepticus: Secondary | ICD-10-CM | POA: Diagnosis not present

## 2017-04-18 DIAGNOSIS — I10 Essential (primary) hypertension: Secondary | ICD-10-CM | POA: Diagnosis not present

## 2017-04-18 DIAGNOSIS — E039 Hypothyroidism, unspecified: Secondary | ICD-10-CM | POA: Diagnosis not present

## 2017-04-27 DIAGNOSIS — I10 Essential (primary) hypertension: Secondary | ICD-10-CM | POA: Diagnosis not present

## 2017-04-27 DIAGNOSIS — E039 Hypothyroidism, unspecified: Secondary | ICD-10-CM | POA: Diagnosis not present

## 2017-04-27 DIAGNOSIS — F0390 Unspecified dementia without behavioral disturbance: Secondary | ICD-10-CM | POA: Diagnosis not present

## 2017-04-27 DIAGNOSIS — G40909 Epilepsy, unspecified, not intractable, without status epilepticus: Secondary | ICD-10-CM | POA: Diagnosis not present

## 2017-05-14 DIAGNOSIS — Z9181 History of falling: Secondary | ICD-10-CM | POA: Diagnosis not present

## 2017-05-14 DIAGNOSIS — M810 Age-related osteoporosis without current pathological fracture: Secondary | ICD-10-CM | POA: Diagnosis not present

## 2017-05-14 DIAGNOSIS — K219 Gastro-esophageal reflux disease without esophagitis: Secondary | ICD-10-CM | POA: Diagnosis not present

## 2017-05-14 DIAGNOSIS — Z8781 Personal history of (healed) traumatic fracture: Secondary | ICD-10-CM | POA: Diagnosis not present

## 2017-05-14 DIAGNOSIS — I10 Essential (primary) hypertension: Secondary | ICD-10-CM | POA: Diagnosis not present

## 2017-05-14 DIAGNOSIS — F329 Major depressive disorder, single episode, unspecified: Secondary | ICD-10-CM | POA: Diagnosis not present

## 2017-05-14 DIAGNOSIS — E039 Hypothyroidism, unspecified: Secondary | ICD-10-CM | POA: Diagnosis not present

## 2017-05-14 DIAGNOSIS — Z79899 Other long term (current) drug therapy: Secondary | ICD-10-CM | POA: Diagnosis not present

## 2017-05-14 DIAGNOSIS — M159 Polyosteoarthritis, unspecified: Secondary | ICD-10-CM | POA: Diagnosis not present

## 2017-05-14 DIAGNOSIS — F0390 Unspecified dementia without behavioral disturbance: Secondary | ICD-10-CM | POA: Diagnosis not present

## 2017-05-14 DIAGNOSIS — G4730 Sleep apnea, unspecified: Secondary | ICD-10-CM | POA: Diagnosis not present

## 2017-05-14 DIAGNOSIS — G40909 Epilepsy, unspecified, not intractable, without status epilepticus: Secondary | ICD-10-CM | POA: Diagnosis not present

## 2017-05-18 DIAGNOSIS — G4730 Sleep apnea, unspecified: Secondary | ICD-10-CM | POA: Diagnosis not present

## 2017-05-18 DIAGNOSIS — G40909 Epilepsy, unspecified, not intractable, without status epilepticus: Secondary | ICD-10-CM | POA: Diagnosis not present

## 2017-05-18 DIAGNOSIS — I10 Essential (primary) hypertension: Secondary | ICD-10-CM | POA: Diagnosis not present

## 2017-05-18 DIAGNOSIS — F0390 Unspecified dementia without behavioral disturbance: Secondary | ICD-10-CM | POA: Diagnosis not present

## 2017-05-18 DIAGNOSIS — M810 Age-related osteoporosis without current pathological fracture: Secondary | ICD-10-CM | POA: Diagnosis not present

## 2017-05-18 DIAGNOSIS — M159 Polyosteoarthritis, unspecified: Secondary | ICD-10-CM | POA: Diagnosis not present

## 2017-05-22 DIAGNOSIS — M159 Polyosteoarthritis, unspecified: Secondary | ICD-10-CM | POA: Diagnosis not present

## 2017-05-22 DIAGNOSIS — F0390 Unspecified dementia without behavioral disturbance: Secondary | ICD-10-CM | POA: Diagnosis not present

## 2017-05-22 DIAGNOSIS — I10 Essential (primary) hypertension: Secondary | ICD-10-CM | POA: Diagnosis not present

## 2017-05-22 DIAGNOSIS — G4730 Sleep apnea, unspecified: Secondary | ICD-10-CM | POA: Diagnosis not present

## 2017-05-22 DIAGNOSIS — M810 Age-related osteoporosis without current pathological fracture: Secondary | ICD-10-CM | POA: Diagnosis not present

## 2017-05-22 DIAGNOSIS — G40909 Epilepsy, unspecified, not intractable, without status epilepticus: Secondary | ICD-10-CM | POA: Diagnosis not present

## 2017-05-23 DIAGNOSIS — G4730 Sleep apnea, unspecified: Secondary | ICD-10-CM | POA: Diagnosis not present

## 2017-05-23 DIAGNOSIS — F0390 Unspecified dementia without behavioral disturbance: Secondary | ICD-10-CM | POA: Diagnosis not present

## 2017-05-23 DIAGNOSIS — I10 Essential (primary) hypertension: Secondary | ICD-10-CM | POA: Diagnosis not present

## 2017-05-23 DIAGNOSIS — M810 Age-related osteoporosis without current pathological fracture: Secondary | ICD-10-CM | POA: Diagnosis not present

## 2017-05-23 DIAGNOSIS — M159 Polyosteoarthritis, unspecified: Secondary | ICD-10-CM | POA: Diagnosis not present

## 2017-05-23 DIAGNOSIS — G40909 Epilepsy, unspecified, not intractable, without status epilepticus: Secondary | ICD-10-CM | POA: Diagnosis not present

## 2017-05-24 DIAGNOSIS — I10 Essential (primary) hypertension: Secondary | ICD-10-CM | POA: Diagnosis not present

## 2017-05-24 DIAGNOSIS — M810 Age-related osteoporosis without current pathological fracture: Secondary | ICD-10-CM | POA: Diagnosis not present

## 2017-05-24 DIAGNOSIS — F0390 Unspecified dementia without behavioral disturbance: Secondary | ICD-10-CM | POA: Diagnosis not present

## 2017-05-24 DIAGNOSIS — G40909 Epilepsy, unspecified, not intractable, without status epilepticus: Secondary | ICD-10-CM | POA: Diagnosis not present

## 2017-05-24 DIAGNOSIS — M159 Polyosteoarthritis, unspecified: Secondary | ICD-10-CM | POA: Diagnosis not present

## 2017-05-24 DIAGNOSIS — G4730 Sleep apnea, unspecified: Secondary | ICD-10-CM | POA: Diagnosis not present

## 2017-05-29 DIAGNOSIS — G40909 Epilepsy, unspecified, not intractable, without status epilepticus: Secondary | ICD-10-CM | POA: Diagnosis not present

## 2017-05-29 DIAGNOSIS — F0390 Unspecified dementia without behavioral disturbance: Secondary | ICD-10-CM | POA: Diagnosis not present

## 2017-05-29 DIAGNOSIS — G4730 Sleep apnea, unspecified: Secondary | ICD-10-CM | POA: Diagnosis not present

## 2017-05-29 DIAGNOSIS — M810 Age-related osteoporosis without current pathological fracture: Secondary | ICD-10-CM | POA: Diagnosis not present

## 2017-05-29 DIAGNOSIS — I10 Essential (primary) hypertension: Secondary | ICD-10-CM | POA: Diagnosis not present

## 2017-05-29 DIAGNOSIS — M159 Polyosteoarthritis, unspecified: Secondary | ICD-10-CM | POA: Diagnosis not present

## 2017-05-30 DIAGNOSIS — G4730 Sleep apnea, unspecified: Secondary | ICD-10-CM | POA: Diagnosis not present

## 2017-05-30 DIAGNOSIS — F0390 Unspecified dementia without behavioral disturbance: Secondary | ICD-10-CM | POA: Diagnosis not present

## 2017-05-30 DIAGNOSIS — G40909 Epilepsy, unspecified, not intractable, without status epilepticus: Secondary | ICD-10-CM | POA: Diagnosis not present

## 2017-05-30 DIAGNOSIS — M159 Polyosteoarthritis, unspecified: Secondary | ICD-10-CM | POA: Diagnosis not present

## 2017-05-30 DIAGNOSIS — M810 Age-related osteoporosis without current pathological fracture: Secondary | ICD-10-CM | POA: Diagnosis not present

## 2017-05-30 DIAGNOSIS — I10 Essential (primary) hypertension: Secondary | ICD-10-CM | POA: Diagnosis not present

## 2017-05-31 DIAGNOSIS — G4730 Sleep apnea, unspecified: Secondary | ICD-10-CM | POA: Diagnosis not present

## 2017-05-31 DIAGNOSIS — M810 Age-related osteoporosis without current pathological fracture: Secondary | ICD-10-CM | POA: Diagnosis not present

## 2017-05-31 DIAGNOSIS — I10 Essential (primary) hypertension: Secondary | ICD-10-CM | POA: Diagnosis not present

## 2017-05-31 DIAGNOSIS — G40909 Epilepsy, unspecified, not intractable, without status epilepticus: Secondary | ICD-10-CM | POA: Diagnosis not present

## 2017-05-31 DIAGNOSIS — F0390 Unspecified dementia without behavioral disturbance: Secondary | ICD-10-CM | POA: Diagnosis not present

## 2017-05-31 DIAGNOSIS — M159 Polyosteoarthritis, unspecified: Secondary | ICD-10-CM | POA: Diagnosis not present

## 2017-06-05 DIAGNOSIS — M810 Age-related osteoporosis without current pathological fracture: Secondary | ICD-10-CM | POA: Diagnosis not present

## 2017-06-05 DIAGNOSIS — G40909 Epilepsy, unspecified, not intractable, without status epilepticus: Secondary | ICD-10-CM | POA: Diagnosis not present

## 2017-06-05 DIAGNOSIS — G4730 Sleep apnea, unspecified: Secondary | ICD-10-CM | POA: Diagnosis not present

## 2017-06-05 DIAGNOSIS — I10 Essential (primary) hypertension: Secondary | ICD-10-CM | POA: Diagnosis not present

## 2017-06-05 DIAGNOSIS — F0390 Unspecified dementia without behavioral disturbance: Secondary | ICD-10-CM | POA: Diagnosis not present

## 2017-06-05 DIAGNOSIS — M159 Polyosteoarthritis, unspecified: Secondary | ICD-10-CM | POA: Diagnosis not present

## 2017-06-06 DIAGNOSIS — M159 Polyosteoarthritis, unspecified: Secondary | ICD-10-CM | POA: Diagnosis not present

## 2017-06-06 DIAGNOSIS — G40909 Epilepsy, unspecified, not intractable, without status epilepticus: Secondary | ICD-10-CM | POA: Diagnosis not present

## 2017-06-06 DIAGNOSIS — G4730 Sleep apnea, unspecified: Secondary | ICD-10-CM | POA: Diagnosis not present

## 2017-06-06 DIAGNOSIS — F0390 Unspecified dementia without behavioral disturbance: Secondary | ICD-10-CM | POA: Diagnosis not present

## 2017-06-06 DIAGNOSIS — I10 Essential (primary) hypertension: Secondary | ICD-10-CM | POA: Diagnosis not present

## 2017-06-06 DIAGNOSIS — M810 Age-related osteoporosis without current pathological fracture: Secondary | ICD-10-CM | POA: Diagnosis not present

## 2017-06-07 DIAGNOSIS — G4730 Sleep apnea, unspecified: Secondary | ICD-10-CM | POA: Diagnosis not present

## 2017-06-07 DIAGNOSIS — F0390 Unspecified dementia without behavioral disturbance: Secondary | ICD-10-CM | POA: Diagnosis not present

## 2017-06-07 DIAGNOSIS — M810 Age-related osteoporosis without current pathological fracture: Secondary | ICD-10-CM | POA: Diagnosis not present

## 2017-06-07 DIAGNOSIS — M159 Polyosteoarthritis, unspecified: Secondary | ICD-10-CM | POA: Diagnosis not present

## 2017-06-07 DIAGNOSIS — I10 Essential (primary) hypertension: Secondary | ICD-10-CM | POA: Diagnosis not present

## 2017-06-07 DIAGNOSIS — G40909 Epilepsy, unspecified, not intractable, without status epilepticus: Secondary | ICD-10-CM | POA: Diagnosis not present

## 2017-06-13 DIAGNOSIS — M159 Polyosteoarthritis, unspecified: Secondary | ICD-10-CM | POA: Diagnosis not present

## 2017-06-13 DIAGNOSIS — I10 Essential (primary) hypertension: Secondary | ICD-10-CM | POA: Diagnosis not present

## 2017-06-13 DIAGNOSIS — F0390 Unspecified dementia without behavioral disturbance: Secondary | ICD-10-CM | POA: Diagnosis not present

## 2017-06-13 DIAGNOSIS — G4730 Sleep apnea, unspecified: Secondary | ICD-10-CM | POA: Diagnosis not present

## 2017-06-13 DIAGNOSIS — G40909 Epilepsy, unspecified, not intractable, without status epilepticus: Secondary | ICD-10-CM | POA: Diagnosis not present

## 2017-06-13 DIAGNOSIS — M810 Age-related osteoporosis without current pathological fracture: Secondary | ICD-10-CM | POA: Diagnosis not present

## 2017-06-18 DIAGNOSIS — R2681 Unsteadiness on feet: Secondary | ICD-10-CM | POA: Diagnosis not present

## 2017-06-18 DIAGNOSIS — Z8781 Personal history of (healed) traumatic fracture: Secondary | ICD-10-CM | POA: Diagnosis not present

## 2017-06-18 DIAGNOSIS — G40909 Epilepsy, unspecified, not intractable, without status epilepticus: Secondary | ICD-10-CM | POA: Diagnosis not present

## 2017-06-18 DIAGNOSIS — R41841 Cognitive communication deficit: Secondary | ICD-10-CM | POA: Diagnosis not present

## 2017-06-18 DIAGNOSIS — M810 Age-related osteoporosis without current pathological fracture: Secondary | ICD-10-CM | POA: Diagnosis not present

## 2017-06-18 DIAGNOSIS — M6281 Muscle weakness (generalized): Secondary | ICD-10-CM | POA: Diagnosis not present

## 2017-06-18 DIAGNOSIS — F0390 Unspecified dementia without behavioral disturbance: Secondary | ICD-10-CM | POA: Diagnosis not present

## 2017-06-19 DIAGNOSIS — F0390 Unspecified dementia without behavioral disturbance: Secondary | ICD-10-CM | POA: Diagnosis not present

## 2017-06-19 DIAGNOSIS — E039 Hypothyroidism, unspecified: Secondary | ICD-10-CM | POA: Diagnosis not present

## 2017-06-19 DIAGNOSIS — I10 Essential (primary) hypertension: Secondary | ICD-10-CM | POA: Diagnosis not present

## 2017-06-19 DIAGNOSIS — K219 Gastro-esophageal reflux disease without esophagitis: Secondary | ICD-10-CM | POA: Diagnosis not present

## 2017-06-20 DIAGNOSIS — F0390 Unspecified dementia without behavioral disturbance: Secondary | ICD-10-CM | POA: Diagnosis not present

## 2017-06-20 DIAGNOSIS — R2681 Unsteadiness on feet: Secondary | ICD-10-CM | POA: Diagnosis not present

## 2017-06-20 DIAGNOSIS — M810 Age-related osteoporosis without current pathological fracture: Secondary | ICD-10-CM | POA: Diagnosis not present

## 2017-06-20 DIAGNOSIS — Z8781 Personal history of (healed) traumatic fracture: Secondary | ICD-10-CM | POA: Diagnosis not present

## 2017-06-20 DIAGNOSIS — R41841 Cognitive communication deficit: Secondary | ICD-10-CM | POA: Diagnosis not present

## 2017-06-20 DIAGNOSIS — G40909 Epilepsy, unspecified, not intractable, without status epilepticus: Secondary | ICD-10-CM | POA: Diagnosis not present

## 2017-06-21 DIAGNOSIS — Z8781 Personal history of (healed) traumatic fracture: Secondary | ICD-10-CM | POA: Diagnosis not present

## 2017-06-21 DIAGNOSIS — R41841 Cognitive communication deficit: Secondary | ICD-10-CM | POA: Diagnosis not present

## 2017-06-21 DIAGNOSIS — G40909 Epilepsy, unspecified, not intractable, without status epilepticus: Secondary | ICD-10-CM | POA: Diagnosis not present

## 2017-06-21 DIAGNOSIS — M810 Age-related osteoporosis without current pathological fracture: Secondary | ICD-10-CM | POA: Diagnosis not present

## 2017-06-21 DIAGNOSIS — W19XXXA Unspecified fall, initial encounter: Secondary | ICD-10-CM | POA: Diagnosis not present

## 2017-06-21 DIAGNOSIS — F0390 Unspecified dementia without behavioral disturbance: Secondary | ICD-10-CM | POA: Diagnosis not present

## 2017-06-21 DIAGNOSIS — R2681 Unsteadiness on feet: Secondary | ICD-10-CM | POA: Diagnosis not present

## 2017-06-22 DIAGNOSIS — Z8781 Personal history of (healed) traumatic fracture: Secondary | ICD-10-CM | POA: Diagnosis not present

## 2017-06-22 DIAGNOSIS — R2681 Unsteadiness on feet: Secondary | ICD-10-CM | POA: Diagnosis not present

## 2017-06-22 DIAGNOSIS — G40909 Epilepsy, unspecified, not intractable, without status epilepticus: Secondary | ICD-10-CM | POA: Diagnosis not present

## 2017-06-22 DIAGNOSIS — F0390 Unspecified dementia without behavioral disturbance: Secondary | ICD-10-CM | POA: Diagnosis not present

## 2017-06-22 DIAGNOSIS — M810 Age-related osteoporosis without current pathological fracture: Secondary | ICD-10-CM | POA: Diagnosis not present

## 2017-06-22 DIAGNOSIS — R41841 Cognitive communication deficit: Secondary | ICD-10-CM | POA: Diagnosis not present

## 2017-06-23 DIAGNOSIS — R2681 Unsteadiness on feet: Secondary | ICD-10-CM | POA: Diagnosis not present

## 2017-06-23 DIAGNOSIS — Z8781 Personal history of (healed) traumatic fracture: Secondary | ICD-10-CM | POA: Diagnosis not present

## 2017-06-23 DIAGNOSIS — R41841 Cognitive communication deficit: Secondary | ICD-10-CM | POA: Diagnosis not present

## 2017-06-23 DIAGNOSIS — M810 Age-related osteoporosis without current pathological fracture: Secondary | ICD-10-CM | POA: Diagnosis not present

## 2017-06-23 DIAGNOSIS — F0390 Unspecified dementia without behavioral disturbance: Secondary | ICD-10-CM | POA: Diagnosis not present

## 2017-06-23 DIAGNOSIS — G40909 Epilepsy, unspecified, not intractable, without status epilepticus: Secondary | ICD-10-CM | POA: Diagnosis not present

## 2017-06-26 DIAGNOSIS — R41841 Cognitive communication deficit: Secondary | ICD-10-CM | POA: Diagnosis not present

## 2017-06-26 DIAGNOSIS — G40909 Epilepsy, unspecified, not intractable, without status epilepticus: Secondary | ICD-10-CM | POA: Diagnosis not present

## 2017-06-26 DIAGNOSIS — Z8781 Personal history of (healed) traumatic fracture: Secondary | ICD-10-CM | POA: Diagnosis not present

## 2017-06-26 DIAGNOSIS — R451 Restlessness and agitation: Secondary | ICD-10-CM | POA: Diagnosis not present

## 2017-06-26 DIAGNOSIS — M810 Age-related osteoporosis without current pathological fracture: Secondary | ICD-10-CM | POA: Diagnosis not present

## 2017-06-26 DIAGNOSIS — R2681 Unsteadiness on feet: Secondary | ICD-10-CM | POA: Diagnosis not present

## 2017-06-26 DIAGNOSIS — F0390 Unspecified dementia without behavioral disturbance: Secondary | ICD-10-CM | POA: Diagnosis not present

## 2017-06-27 DIAGNOSIS — R2681 Unsteadiness on feet: Secondary | ICD-10-CM | POA: Diagnosis not present

## 2017-06-27 DIAGNOSIS — M810 Age-related osteoporosis without current pathological fracture: Secondary | ICD-10-CM | POA: Diagnosis not present

## 2017-06-27 DIAGNOSIS — G40909 Epilepsy, unspecified, not intractable, without status epilepticus: Secondary | ICD-10-CM | POA: Diagnosis not present

## 2017-06-27 DIAGNOSIS — Z8781 Personal history of (healed) traumatic fracture: Secondary | ICD-10-CM | POA: Diagnosis not present

## 2017-06-27 DIAGNOSIS — F0390 Unspecified dementia without behavioral disturbance: Secondary | ICD-10-CM | POA: Diagnosis not present

## 2017-06-27 DIAGNOSIS — R41841 Cognitive communication deficit: Secondary | ICD-10-CM | POA: Diagnosis not present

## 2017-06-28 DIAGNOSIS — Z8781 Personal history of (healed) traumatic fracture: Secondary | ICD-10-CM | POA: Diagnosis not present

## 2017-06-28 DIAGNOSIS — R2681 Unsteadiness on feet: Secondary | ICD-10-CM | POA: Diagnosis not present

## 2017-06-28 DIAGNOSIS — R41841 Cognitive communication deficit: Secondary | ICD-10-CM | POA: Diagnosis not present

## 2017-06-28 DIAGNOSIS — F0390 Unspecified dementia without behavioral disturbance: Secondary | ICD-10-CM | POA: Diagnosis not present

## 2017-06-28 DIAGNOSIS — G40909 Epilepsy, unspecified, not intractable, without status epilepticus: Secondary | ICD-10-CM | POA: Diagnosis not present

## 2017-06-28 DIAGNOSIS — M810 Age-related osteoporosis without current pathological fracture: Secondary | ICD-10-CM | POA: Diagnosis not present

## 2017-06-28 DIAGNOSIS — R451 Restlessness and agitation: Secondary | ICD-10-CM | POA: Diagnosis not present

## 2017-06-29 DIAGNOSIS — Z8781 Personal history of (healed) traumatic fracture: Secondary | ICD-10-CM | POA: Diagnosis not present

## 2017-06-29 DIAGNOSIS — F0390 Unspecified dementia without behavioral disturbance: Secondary | ICD-10-CM | POA: Diagnosis not present

## 2017-06-29 DIAGNOSIS — R41841 Cognitive communication deficit: Secondary | ICD-10-CM | POA: Diagnosis not present

## 2017-06-29 DIAGNOSIS — R2681 Unsteadiness on feet: Secondary | ICD-10-CM | POA: Diagnosis not present

## 2017-06-29 DIAGNOSIS — M810 Age-related osteoporosis without current pathological fracture: Secondary | ICD-10-CM | POA: Diagnosis not present

## 2017-06-29 DIAGNOSIS — G40909 Epilepsy, unspecified, not intractable, without status epilepticus: Secondary | ICD-10-CM | POA: Diagnosis not present

## 2017-06-30 DIAGNOSIS — R41841 Cognitive communication deficit: Secondary | ICD-10-CM | POA: Diagnosis not present

## 2017-06-30 DIAGNOSIS — G40909 Epilepsy, unspecified, not intractable, without status epilepticus: Secondary | ICD-10-CM | POA: Diagnosis not present

## 2017-06-30 DIAGNOSIS — Z8781 Personal history of (healed) traumatic fracture: Secondary | ICD-10-CM | POA: Diagnosis not present

## 2017-06-30 DIAGNOSIS — M810 Age-related osteoporosis without current pathological fracture: Secondary | ICD-10-CM | POA: Diagnosis not present

## 2017-06-30 DIAGNOSIS — F0390 Unspecified dementia without behavioral disturbance: Secondary | ICD-10-CM | POA: Diagnosis not present

## 2017-06-30 DIAGNOSIS — R2681 Unsteadiness on feet: Secondary | ICD-10-CM | POA: Diagnosis not present

## 2017-07-03 DIAGNOSIS — Z8781 Personal history of (healed) traumatic fracture: Secondary | ICD-10-CM | POA: Diagnosis not present

## 2017-07-03 DIAGNOSIS — G40909 Epilepsy, unspecified, not intractable, without status epilepticus: Secondary | ICD-10-CM | POA: Diagnosis not present

## 2017-07-03 DIAGNOSIS — F0390 Unspecified dementia without behavioral disturbance: Secondary | ICD-10-CM | POA: Diagnosis not present

## 2017-07-03 DIAGNOSIS — R2681 Unsteadiness on feet: Secondary | ICD-10-CM | POA: Diagnosis not present

## 2017-07-03 DIAGNOSIS — I10 Essential (primary) hypertension: Secondary | ICD-10-CM | POA: Diagnosis not present

## 2017-07-03 DIAGNOSIS — R41841 Cognitive communication deficit: Secondary | ICD-10-CM | POA: Diagnosis not present

## 2017-07-03 DIAGNOSIS — M810 Age-related osteoporosis without current pathological fracture: Secondary | ICD-10-CM | POA: Diagnosis not present

## 2017-07-04 DIAGNOSIS — F0390 Unspecified dementia without behavioral disturbance: Secondary | ICD-10-CM | POA: Diagnosis not present

## 2017-07-04 DIAGNOSIS — Z8781 Personal history of (healed) traumatic fracture: Secondary | ICD-10-CM | POA: Diagnosis not present

## 2017-07-04 DIAGNOSIS — G40909 Epilepsy, unspecified, not intractable, without status epilepticus: Secondary | ICD-10-CM | POA: Diagnosis not present

## 2017-07-04 DIAGNOSIS — R41841 Cognitive communication deficit: Secondary | ICD-10-CM | POA: Diagnosis not present

## 2017-07-04 DIAGNOSIS — M810 Age-related osteoporosis without current pathological fracture: Secondary | ICD-10-CM | POA: Diagnosis not present

## 2017-07-04 DIAGNOSIS — R2681 Unsteadiness on feet: Secondary | ICD-10-CM | POA: Diagnosis not present

## 2017-07-05 DIAGNOSIS — R41841 Cognitive communication deficit: Secondary | ICD-10-CM | POA: Diagnosis not present

## 2017-07-05 DIAGNOSIS — R2681 Unsteadiness on feet: Secondary | ICD-10-CM | POA: Diagnosis not present

## 2017-07-05 DIAGNOSIS — Z8781 Personal history of (healed) traumatic fracture: Secondary | ICD-10-CM | POA: Diagnosis not present

## 2017-07-05 DIAGNOSIS — F0390 Unspecified dementia without behavioral disturbance: Secondary | ICD-10-CM | POA: Diagnosis not present

## 2017-07-05 DIAGNOSIS — G40909 Epilepsy, unspecified, not intractable, without status epilepticus: Secondary | ICD-10-CM | POA: Diagnosis not present

## 2017-07-05 DIAGNOSIS — M810 Age-related osteoporosis without current pathological fracture: Secondary | ICD-10-CM | POA: Diagnosis not present

## 2017-07-06 DIAGNOSIS — M810 Age-related osteoporosis without current pathological fracture: Secondary | ICD-10-CM | POA: Diagnosis not present

## 2017-07-06 DIAGNOSIS — Z8781 Personal history of (healed) traumatic fracture: Secondary | ICD-10-CM | POA: Diagnosis not present

## 2017-07-06 DIAGNOSIS — F0390 Unspecified dementia without behavioral disturbance: Secondary | ICD-10-CM | POA: Diagnosis not present

## 2017-07-06 DIAGNOSIS — R41841 Cognitive communication deficit: Secondary | ICD-10-CM | POA: Diagnosis not present

## 2017-07-06 DIAGNOSIS — R2681 Unsteadiness on feet: Secondary | ICD-10-CM | POA: Diagnosis not present

## 2017-07-06 DIAGNOSIS — G40909 Epilepsy, unspecified, not intractable, without status epilepticus: Secondary | ICD-10-CM | POA: Diagnosis not present

## 2017-07-07 DIAGNOSIS — F0390 Unspecified dementia without behavioral disturbance: Secondary | ICD-10-CM | POA: Diagnosis not present

## 2017-07-07 DIAGNOSIS — M810 Age-related osteoporosis without current pathological fracture: Secondary | ICD-10-CM | POA: Diagnosis not present

## 2017-07-07 DIAGNOSIS — G40909 Epilepsy, unspecified, not intractable, without status epilepticus: Secondary | ICD-10-CM | POA: Diagnosis not present

## 2017-07-07 DIAGNOSIS — R2681 Unsteadiness on feet: Secondary | ICD-10-CM | POA: Diagnosis not present

## 2017-07-07 DIAGNOSIS — R41841 Cognitive communication deficit: Secondary | ICD-10-CM | POA: Diagnosis not present

## 2017-07-07 DIAGNOSIS — Z8781 Personal history of (healed) traumatic fracture: Secondary | ICD-10-CM | POA: Diagnosis not present

## 2017-07-08 DIAGNOSIS — G40909 Epilepsy, unspecified, not intractable, without status epilepticus: Secondary | ICD-10-CM | POA: Diagnosis not present

## 2017-07-08 DIAGNOSIS — Z8781 Personal history of (healed) traumatic fracture: Secondary | ICD-10-CM | POA: Diagnosis not present

## 2017-07-08 DIAGNOSIS — R2681 Unsteadiness on feet: Secondary | ICD-10-CM | POA: Diagnosis not present

## 2017-07-08 DIAGNOSIS — F0390 Unspecified dementia without behavioral disturbance: Secondary | ICD-10-CM | POA: Diagnosis not present

## 2017-07-08 DIAGNOSIS — M810 Age-related osteoporosis without current pathological fracture: Secondary | ICD-10-CM | POA: Diagnosis not present

## 2017-07-08 DIAGNOSIS — R41841 Cognitive communication deficit: Secondary | ICD-10-CM | POA: Diagnosis not present

## 2017-07-10 DIAGNOSIS — R2681 Unsteadiness on feet: Secondary | ICD-10-CM | POA: Diagnosis not present

## 2017-07-10 DIAGNOSIS — R41841 Cognitive communication deficit: Secondary | ICD-10-CM | POA: Diagnosis not present

## 2017-07-10 DIAGNOSIS — F0390 Unspecified dementia without behavioral disturbance: Secondary | ICD-10-CM | POA: Diagnosis not present

## 2017-07-10 DIAGNOSIS — Z8781 Personal history of (healed) traumatic fracture: Secondary | ICD-10-CM | POA: Diagnosis not present

## 2017-07-10 DIAGNOSIS — M810 Age-related osteoporosis without current pathological fracture: Secondary | ICD-10-CM | POA: Diagnosis not present

## 2017-07-10 DIAGNOSIS — G40909 Epilepsy, unspecified, not intractable, without status epilepticus: Secondary | ICD-10-CM | POA: Diagnosis not present

## 2017-07-11 DIAGNOSIS — R2681 Unsteadiness on feet: Secondary | ICD-10-CM | POA: Diagnosis not present

## 2017-07-11 DIAGNOSIS — M810 Age-related osteoporosis without current pathological fracture: Secondary | ICD-10-CM | POA: Diagnosis not present

## 2017-07-11 DIAGNOSIS — G40909 Epilepsy, unspecified, not intractable, without status epilepticus: Secondary | ICD-10-CM | POA: Diagnosis not present

## 2017-07-11 DIAGNOSIS — R41841 Cognitive communication deficit: Secondary | ICD-10-CM | POA: Diagnosis not present

## 2017-07-11 DIAGNOSIS — F0390 Unspecified dementia without behavioral disturbance: Secondary | ICD-10-CM | POA: Diagnosis not present

## 2017-07-11 DIAGNOSIS — Z8781 Personal history of (healed) traumatic fracture: Secondary | ICD-10-CM | POA: Diagnosis not present

## 2017-07-12 DIAGNOSIS — G40909 Epilepsy, unspecified, not intractable, without status epilepticus: Secondary | ICD-10-CM | POA: Diagnosis not present

## 2017-07-12 DIAGNOSIS — R2681 Unsteadiness on feet: Secondary | ICD-10-CM | POA: Diagnosis not present

## 2017-07-12 DIAGNOSIS — R41841 Cognitive communication deficit: Secondary | ICD-10-CM | POA: Diagnosis not present

## 2017-07-12 DIAGNOSIS — M810 Age-related osteoporosis without current pathological fracture: Secondary | ICD-10-CM | POA: Diagnosis not present

## 2017-07-12 DIAGNOSIS — F0390 Unspecified dementia without behavioral disturbance: Secondary | ICD-10-CM | POA: Diagnosis not present

## 2017-07-12 DIAGNOSIS — Z8781 Personal history of (healed) traumatic fracture: Secondary | ICD-10-CM | POA: Diagnosis not present

## 2017-07-13 DIAGNOSIS — F0390 Unspecified dementia without behavioral disturbance: Secondary | ICD-10-CM | POA: Diagnosis not present

## 2017-07-13 DIAGNOSIS — R41841 Cognitive communication deficit: Secondary | ICD-10-CM | POA: Diagnosis not present

## 2017-07-13 DIAGNOSIS — Z8781 Personal history of (healed) traumatic fracture: Secondary | ICD-10-CM | POA: Diagnosis not present

## 2017-07-13 DIAGNOSIS — R2681 Unsteadiness on feet: Secondary | ICD-10-CM | POA: Diagnosis not present

## 2017-07-13 DIAGNOSIS — G40909 Epilepsy, unspecified, not intractable, without status epilepticus: Secondary | ICD-10-CM | POA: Diagnosis not present

## 2017-07-13 DIAGNOSIS — M810 Age-related osteoporosis without current pathological fracture: Secondary | ICD-10-CM | POA: Diagnosis not present

## 2017-07-14 DIAGNOSIS — Z8781 Personal history of (healed) traumatic fracture: Secondary | ICD-10-CM | POA: Diagnosis not present

## 2017-07-14 DIAGNOSIS — R2681 Unsteadiness on feet: Secondary | ICD-10-CM | POA: Diagnosis not present

## 2017-07-14 DIAGNOSIS — F0390 Unspecified dementia without behavioral disturbance: Secondary | ICD-10-CM | POA: Diagnosis not present

## 2017-07-14 DIAGNOSIS — R41841 Cognitive communication deficit: Secondary | ICD-10-CM | POA: Diagnosis not present

## 2017-07-14 DIAGNOSIS — G40909 Epilepsy, unspecified, not intractable, without status epilepticus: Secondary | ICD-10-CM | POA: Diagnosis not present

## 2017-07-14 DIAGNOSIS — M810 Age-related osteoporosis without current pathological fracture: Secondary | ICD-10-CM | POA: Diagnosis not present

## 2017-07-15 DIAGNOSIS — F0390 Unspecified dementia without behavioral disturbance: Secondary | ICD-10-CM | POA: Diagnosis not present

## 2017-07-15 DIAGNOSIS — R2681 Unsteadiness on feet: Secondary | ICD-10-CM | POA: Diagnosis not present

## 2017-07-15 DIAGNOSIS — Z8781 Personal history of (healed) traumatic fracture: Secondary | ICD-10-CM | POA: Diagnosis not present

## 2017-07-15 DIAGNOSIS — G40909 Epilepsy, unspecified, not intractable, without status epilepticus: Secondary | ICD-10-CM | POA: Diagnosis not present

## 2017-07-15 DIAGNOSIS — R41841 Cognitive communication deficit: Secondary | ICD-10-CM | POA: Diagnosis not present

## 2017-07-15 DIAGNOSIS — M810 Age-related osteoporosis without current pathological fracture: Secondary | ICD-10-CM | POA: Diagnosis not present

## 2017-07-16 DIAGNOSIS — R41841 Cognitive communication deficit: Secondary | ICD-10-CM | POA: Diagnosis not present

## 2017-07-16 DIAGNOSIS — M810 Age-related osteoporosis without current pathological fracture: Secondary | ICD-10-CM | POA: Diagnosis not present

## 2017-07-16 DIAGNOSIS — F0390 Unspecified dementia without behavioral disturbance: Secondary | ICD-10-CM | POA: Diagnosis not present

## 2017-07-16 DIAGNOSIS — R2681 Unsteadiness on feet: Secondary | ICD-10-CM | POA: Diagnosis not present

## 2017-07-16 DIAGNOSIS — Z8781 Personal history of (healed) traumatic fracture: Secondary | ICD-10-CM | POA: Diagnosis not present

## 2017-07-16 DIAGNOSIS — G40909 Epilepsy, unspecified, not intractable, without status epilepticus: Secondary | ICD-10-CM | POA: Diagnosis not present

## 2017-07-17 DIAGNOSIS — R2681 Unsteadiness on feet: Secondary | ICD-10-CM | POA: Diagnosis not present

## 2017-07-17 DIAGNOSIS — G40909 Epilepsy, unspecified, not intractable, without status epilepticus: Secondary | ICD-10-CM | POA: Diagnosis not present

## 2017-07-17 DIAGNOSIS — R41841 Cognitive communication deficit: Secondary | ICD-10-CM | POA: Diagnosis not present

## 2017-07-17 DIAGNOSIS — M159 Polyosteoarthritis, unspecified: Secondary | ICD-10-CM | POA: Diagnosis not present

## 2017-07-17 DIAGNOSIS — M810 Age-related osteoporosis without current pathological fracture: Secondary | ICD-10-CM | POA: Diagnosis not present

## 2017-07-17 DIAGNOSIS — F0390 Unspecified dementia without behavioral disturbance: Secondary | ICD-10-CM | POA: Diagnosis not present

## 2017-07-17 DIAGNOSIS — Z8781 Personal history of (healed) traumatic fracture: Secondary | ICD-10-CM | POA: Diagnosis not present

## 2017-07-17 DIAGNOSIS — M6281 Muscle weakness (generalized): Secondary | ICD-10-CM | POA: Diagnosis not present

## 2017-07-18 DIAGNOSIS — M810 Age-related osteoporosis without current pathological fracture: Secondary | ICD-10-CM | POA: Diagnosis not present

## 2017-07-18 DIAGNOSIS — G40909 Epilepsy, unspecified, not intractable, without status epilepticus: Secondary | ICD-10-CM | POA: Diagnosis not present

## 2017-07-18 DIAGNOSIS — R2681 Unsteadiness on feet: Secondary | ICD-10-CM | POA: Diagnosis not present

## 2017-07-18 DIAGNOSIS — M159 Polyosteoarthritis, unspecified: Secondary | ICD-10-CM | POA: Diagnosis not present

## 2017-07-18 DIAGNOSIS — Z8781 Personal history of (healed) traumatic fracture: Secondary | ICD-10-CM | POA: Diagnosis not present

## 2017-07-18 DIAGNOSIS — I10 Essential (primary) hypertension: Secondary | ICD-10-CM | POA: Diagnosis not present

## 2017-07-18 DIAGNOSIS — F0390 Unspecified dementia without behavioral disturbance: Secondary | ICD-10-CM | POA: Diagnosis not present

## 2017-07-19 DIAGNOSIS — Z8781 Personal history of (healed) traumatic fracture: Secondary | ICD-10-CM | POA: Diagnosis not present

## 2017-07-19 DIAGNOSIS — F0390 Unspecified dementia without behavioral disturbance: Secondary | ICD-10-CM | POA: Diagnosis not present

## 2017-07-19 DIAGNOSIS — R2681 Unsteadiness on feet: Secondary | ICD-10-CM | POA: Diagnosis not present

## 2017-07-19 DIAGNOSIS — G40909 Epilepsy, unspecified, not intractable, without status epilepticus: Secondary | ICD-10-CM | POA: Diagnosis not present

## 2017-07-19 DIAGNOSIS — M810 Age-related osteoporosis without current pathological fracture: Secondary | ICD-10-CM | POA: Diagnosis not present

## 2017-07-19 DIAGNOSIS — M159 Polyosteoarthritis, unspecified: Secondary | ICD-10-CM | POA: Diagnosis not present

## 2017-07-20 DIAGNOSIS — F0390 Unspecified dementia without behavioral disturbance: Secondary | ICD-10-CM | POA: Diagnosis not present

## 2017-07-20 DIAGNOSIS — Z8781 Personal history of (healed) traumatic fracture: Secondary | ICD-10-CM | POA: Diagnosis not present

## 2017-07-20 DIAGNOSIS — G40909 Epilepsy, unspecified, not intractable, without status epilepticus: Secondary | ICD-10-CM | POA: Diagnosis not present

## 2017-07-20 DIAGNOSIS — M810 Age-related osteoporosis without current pathological fracture: Secondary | ICD-10-CM | POA: Diagnosis not present

## 2017-07-20 DIAGNOSIS — R2681 Unsteadiness on feet: Secondary | ICD-10-CM | POA: Diagnosis not present

## 2017-07-20 DIAGNOSIS — M159 Polyosteoarthritis, unspecified: Secondary | ICD-10-CM | POA: Diagnosis not present

## 2017-07-21 DIAGNOSIS — Z8781 Personal history of (healed) traumatic fracture: Secondary | ICD-10-CM | POA: Diagnosis not present

## 2017-07-21 DIAGNOSIS — M159 Polyosteoarthritis, unspecified: Secondary | ICD-10-CM | POA: Diagnosis not present

## 2017-07-21 DIAGNOSIS — M810 Age-related osteoporosis without current pathological fracture: Secondary | ICD-10-CM | POA: Diagnosis not present

## 2017-07-21 DIAGNOSIS — G40909 Epilepsy, unspecified, not intractable, without status epilepticus: Secondary | ICD-10-CM | POA: Diagnosis not present

## 2017-07-21 DIAGNOSIS — R2681 Unsteadiness on feet: Secondary | ICD-10-CM | POA: Diagnosis not present

## 2017-07-21 DIAGNOSIS — F0390 Unspecified dementia without behavioral disturbance: Secondary | ICD-10-CM | POA: Diagnosis not present

## 2017-07-22 DIAGNOSIS — R2681 Unsteadiness on feet: Secondary | ICD-10-CM | POA: Diagnosis not present

## 2017-07-22 DIAGNOSIS — G40909 Epilepsy, unspecified, not intractable, without status epilepticus: Secondary | ICD-10-CM | POA: Diagnosis not present

## 2017-07-22 DIAGNOSIS — F0390 Unspecified dementia without behavioral disturbance: Secondary | ICD-10-CM | POA: Diagnosis not present

## 2017-07-22 DIAGNOSIS — Z8781 Personal history of (healed) traumatic fracture: Secondary | ICD-10-CM | POA: Diagnosis not present

## 2017-07-22 DIAGNOSIS — M159 Polyosteoarthritis, unspecified: Secondary | ICD-10-CM | POA: Diagnosis not present

## 2017-07-22 DIAGNOSIS — M810 Age-related osteoporosis without current pathological fracture: Secondary | ICD-10-CM | POA: Diagnosis not present

## 2017-07-23 DIAGNOSIS — F0390 Unspecified dementia without behavioral disturbance: Secondary | ICD-10-CM | POA: Diagnosis not present

## 2017-07-23 DIAGNOSIS — Z8781 Personal history of (healed) traumatic fracture: Secondary | ICD-10-CM | POA: Diagnosis not present

## 2017-07-23 DIAGNOSIS — G40909 Epilepsy, unspecified, not intractable, without status epilepticus: Secondary | ICD-10-CM | POA: Diagnosis not present

## 2017-07-23 DIAGNOSIS — M159 Polyosteoarthritis, unspecified: Secondary | ICD-10-CM | POA: Diagnosis not present

## 2017-07-23 DIAGNOSIS — M810 Age-related osteoporosis without current pathological fracture: Secondary | ICD-10-CM | POA: Diagnosis not present

## 2017-07-23 DIAGNOSIS — R2681 Unsteadiness on feet: Secondary | ICD-10-CM | POA: Diagnosis not present

## 2017-07-24 DIAGNOSIS — F0390 Unspecified dementia without behavioral disturbance: Secondary | ICD-10-CM | POA: Diagnosis not present

## 2017-07-24 DIAGNOSIS — M810 Age-related osteoporosis without current pathological fracture: Secondary | ICD-10-CM | POA: Diagnosis not present

## 2017-07-24 DIAGNOSIS — R2681 Unsteadiness on feet: Secondary | ICD-10-CM | POA: Diagnosis not present

## 2017-07-24 DIAGNOSIS — Z8781 Personal history of (healed) traumatic fracture: Secondary | ICD-10-CM | POA: Diagnosis not present

## 2017-07-24 DIAGNOSIS — M159 Polyosteoarthritis, unspecified: Secondary | ICD-10-CM | POA: Diagnosis not present

## 2017-07-24 DIAGNOSIS — F321 Major depressive disorder, single episode, moderate: Secondary | ICD-10-CM | POA: Diagnosis not present

## 2017-07-24 DIAGNOSIS — F0281 Dementia in other diseases classified elsewhere with behavioral disturbance: Secondary | ICD-10-CM | POA: Diagnosis not present

## 2017-07-24 DIAGNOSIS — G40909 Epilepsy, unspecified, not intractable, without status epilepticus: Secondary | ICD-10-CM | POA: Diagnosis not present

## 2017-07-25 DIAGNOSIS — M810 Age-related osteoporosis without current pathological fracture: Secondary | ICD-10-CM | POA: Diagnosis not present

## 2017-07-25 DIAGNOSIS — M159 Polyosteoarthritis, unspecified: Secondary | ICD-10-CM | POA: Diagnosis not present

## 2017-07-25 DIAGNOSIS — F0390 Unspecified dementia without behavioral disturbance: Secondary | ICD-10-CM | POA: Diagnosis not present

## 2017-07-25 DIAGNOSIS — R2681 Unsteadiness on feet: Secondary | ICD-10-CM | POA: Diagnosis not present

## 2017-07-25 DIAGNOSIS — G40909 Epilepsy, unspecified, not intractable, without status epilepticus: Secondary | ICD-10-CM | POA: Diagnosis not present

## 2017-07-25 DIAGNOSIS — Z8781 Personal history of (healed) traumatic fracture: Secondary | ICD-10-CM | POA: Diagnosis not present

## 2017-07-26 DIAGNOSIS — R2681 Unsteadiness on feet: Secondary | ICD-10-CM | POA: Diagnosis not present

## 2017-07-26 DIAGNOSIS — M159 Polyosteoarthritis, unspecified: Secondary | ICD-10-CM | POA: Diagnosis not present

## 2017-07-26 DIAGNOSIS — Z8781 Personal history of (healed) traumatic fracture: Secondary | ICD-10-CM | POA: Diagnosis not present

## 2017-07-26 DIAGNOSIS — M810 Age-related osteoporosis without current pathological fracture: Secondary | ICD-10-CM | POA: Diagnosis not present

## 2017-07-26 DIAGNOSIS — G40909 Epilepsy, unspecified, not intractable, without status epilepticus: Secondary | ICD-10-CM | POA: Diagnosis not present

## 2017-07-26 DIAGNOSIS — F0390 Unspecified dementia without behavioral disturbance: Secondary | ICD-10-CM | POA: Diagnosis not present

## 2017-07-27 DIAGNOSIS — M159 Polyosteoarthritis, unspecified: Secondary | ICD-10-CM | POA: Diagnosis not present

## 2017-07-27 DIAGNOSIS — I10 Essential (primary) hypertension: Secondary | ICD-10-CM | POA: Diagnosis not present

## 2017-07-27 DIAGNOSIS — F0390 Unspecified dementia without behavioral disturbance: Secondary | ICD-10-CM | POA: Diagnosis not present

## 2017-07-27 DIAGNOSIS — M810 Age-related osteoporosis without current pathological fracture: Secondary | ICD-10-CM | POA: Diagnosis not present

## 2017-07-27 DIAGNOSIS — G40909 Epilepsy, unspecified, not intractable, without status epilepticus: Secondary | ICD-10-CM | POA: Diagnosis not present

## 2017-07-27 DIAGNOSIS — R2681 Unsteadiness on feet: Secondary | ICD-10-CM | POA: Diagnosis not present

## 2017-07-27 DIAGNOSIS — Z8781 Personal history of (healed) traumatic fracture: Secondary | ICD-10-CM | POA: Diagnosis not present

## 2017-07-28 DIAGNOSIS — M810 Age-related osteoporosis without current pathological fracture: Secondary | ICD-10-CM | POA: Diagnosis not present

## 2017-07-28 DIAGNOSIS — M159 Polyosteoarthritis, unspecified: Secondary | ICD-10-CM | POA: Diagnosis not present

## 2017-07-28 DIAGNOSIS — R2681 Unsteadiness on feet: Secondary | ICD-10-CM | POA: Diagnosis not present

## 2017-07-28 DIAGNOSIS — F0390 Unspecified dementia without behavioral disturbance: Secondary | ICD-10-CM | POA: Diagnosis not present

## 2017-07-28 DIAGNOSIS — Z8781 Personal history of (healed) traumatic fracture: Secondary | ICD-10-CM | POA: Diagnosis not present

## 2017-07-28 DIAGNOSIS — G40909 Epilepsy, unspecified, not intractable, without status epilepticus: Secondary | ICD-10-CM | POA: Diagnosis not present

## 2017-07-30 DIAGNOSIS — R2681 Unsteadiness on feet: Secondary | ICD-10-CM | POA: Diagnosis not present

## 2017-07-30 DIAGNOSIS — M810 Age-related osteoporosis without current pathological fracture: Secondary | ICD-10-CM | POA: Diagnosis not present

## 2017-07-30 DIAGNOSIS — Z8781 Personal history of (healed) traumatic fracture: Secondary | ICD-10-CM | POA: Diagnosis not present

## 2017-07-30 DIAGNOSIS — M159 Polyosteoarthritis, unspecified: Secondary | ICD-10-CM | POA: Diagnosis not present

## 2017-07-30 DIAGNOSIS — F0390 Unspecified dementia without behavioral disturbance: Secondary | ICD-10-CM | POA: Diagnosis not present

## 2017-07-30 DIAGNOSIS — G40909 Epilepsy, unspecified, not intractable, without status epilepticus: Secondary | ICD-10-CM | POA: Diagnosis not present

## 2017-07-31 DIAGNOSIS — G40909 Epilepsy, unspecified, not intractable, without status epilepticus: Secondary | ICD-10-CM | POA: Diagnosis not present

## 2017-07-31 DIAGNOSIS — M159 Polyosteoarthritis, unspecified: Secondary | ICD-10-CM | POA: Diagnosis not present

## 2017-07-31 DIAGNOSIS — R2681 Unsteadiness on feet: Secondary | ICD-10-CM | POA: Diagnosis not present

## 2017-07-31 DIAGNOSIS — F0390 Unspecified dementia without behavioral disturbance: Secondary | ICD-10-CM | POA: Diagnosis not present

## 2017-07-31 DIAGNOSIS — Z8781 Personal history of (healed) traumatic fracture: Secondary | ICD-10-CM | POA: Diagnosis not present

## 2017-07-31 DIAGNOSIS — M810 Age-related osteoporosis without current pathological fracture: Secondary | ICD-10-CM | POA: Diagnosis not present

## 2017-08-01 DIAGNOSIS — M159 Polyosteoarthritis, unspecified: Secondary | ICD-10-CM | POA: Diagnosis not present

## 2017-08-01 DIAGNOSIS — G40909 Epilepsy, unspecified, not intractable, without status epilepticus: Secondary | ICD-10-CM | POA: Diagnosis not present

## 2017-08-01 DIAGNOSIS — R2681 Unsteadiness on feet: Secondary | ICD-10-CM | POA: Diagnosis not present

## 2017-08-01 DIAGNOSIS — M810 Age-related osteoporosis without current pathological fracture: Secondary | ICD-10-CM | POA: Diagnosis not present

## 2017-08-01 DIAGNOSIS — F0390 Unspecified dementia without behavioral disturbance: Secondary | ICD-10-CM | POA: Diagnosis not present

## 2017-08-01 DIAGNOSIS — Z8781 Personal history of (healed) traumatic fracture: Secondary | ICD-10-CM | POA: Diagnosis not present

## 2017-08-02 DIAGNOSIS — R2681 Unsteadiness on feet: Secondary | ICD-10-CM | POA: Diagnosis not present

## 2017-08-02 DIAGNOSIS — F0390 Unspecified dementia without behavioral disturbance: Secondary | ICD-10-CM | POA: Diagnosis not present

## 2017-08-02 DIAGNOSIS — G40909 Epilepsy, unspecified, not intractable, without status epilepticus: Secondary | ICD-10-CM | POA: Diagnosis not present

## 2017-08-02 DIAGNOSIS — M159 Polyosteoarthritis, unspecified: Secondary | ICD-10-CM | POA: Diagnosis not present

## 2017-08-02 DIAGNOSIS — Z8781 Personal history of (healed) traumatic fracture: Secondary | ICD-10-CM | POA: Diagnosis not present

## 2017-08-02 DIAGNOSIS — M810 Age-related osteoporosis without current pathological fracture: Secondary | ICD-10-CM | POA: Diagnosis not present

## 2017-08-03 DIAGNOSIS — R2681 Unsteadiness on feet: Secondary | ICD-10-CM | POA: Diagnosis not present

## 2017-08-03 DIAGNOSIS — M159 Polyosteoarthritis, unspecified: Secondary | ICD-10-CM | POA: Diagnosis not present

## 2017-08-03 DIAGNOSIS — F0390 Unspecified dementia without behavioral disturbance: Secondary | ICD-10-CM | POA: Diagnosis not present

## 2017-08-03 DIAGNOSIS — G40909 Epilepsy, unspecified, not intractable, without status epilepticus: Secondary | ICD-10-CM | POA: Diagnosis not present

## 2017-08-03 DIAGNOSIS — M810 Age-related osteoporosis without current pathological fracture: Secondary | ICD-10-CM | POA: Diagnosis not present

## 2017-08-03 DIAGNOSIS — Z8781 Personal history of (healed) traumatic fracture: Secondary | ICD-10-CM | POA: Diagnosis not present

## 2017-08-04 DIAGNOSIS — G40909 Epilepsy, unspecified, not intractable, without status epilepticus: Secondary | ICD-10-CM | POA: Diagnosis not present

## 2017-08-04 DIAGNOSIS — M159 Polyosteoarthritis, unspecified: Secondary | ICD-10-CM | POA: Diagnosis not present

## 2017-08-04 DIAGNOSIS — F0390 Unspecified dementia without behavioral disturbance: Secondary | ICD-10-CM | POA: Diagnosis not present

## 2017-08-04 DIAGNOSIS — R2681 Unsteadiness on feet: Secondary | ICD-10-CM | POA: Diagnosis not present

## 2017-08-04 DIAGNOSIS — Z8781 Personal history of (healed) traumatic fracture: Secondary | ICD-10-CM | POA: Diagnosis not present

## 2017-08-04 DIAGNOSIS — M810 Age-related osteoporosis without current pathological fracture: Secondary | ICD-10-CM | POA: Diagnosis not present

## 2017-08-05 DIAGNOSIS — F0390 Unspecified dementia without behavioral disturbance: Secondary | ICD-10-CM | POA: Diagnosis not present

## 2017-08-05 DIAGNOSIS — M810 Age-related osteoporosis without current pathological fracture: Secondary | ICD-10-CM | POA: Diagnosis not present

## 2017-08-05 DIAGNOSIS — Z8781 Personal history of (healed) traumatic fracture: Secondary | ICD-10-CM | POA: Diagnosis not present

## 2017-08-05 DIAGNOSIS — M159 Polyosteoarthritis, unspecified: Secondary | ICD-10-CM | POA: Diagnosis not present

## 2017-08-05 DIAGNOSIS — G40909 Epilepsy, unspecified, not intractable, without status epilepticus: Secondary | ICD-10-CM | POA: Diagnosis not present

## 2017-08-05 DIAGNOSIS — R2681 Unsteadiness on feet: Secondary | ICD-10-CM | POA: Diagnosis not present

## 2017-08-06 DIAGNOSIS — F0390 Unspecified dementia without behavioral disturbance: Secondary | ICD-10-CM | POA: Diagnosis not present

## 2017-08-06 DIAGNOSIS — M159 Polyosteoarthritis, unspecified: Secondary | ICD-10-CM | POA: Diagnosis not present

## 2017-08-06 DIAGNOSIS — M810 Age-related osteoporosis without current pathological fracture: Secondary | ICD-10-CM | POA: Diagnosis not present

## 2017-08-06 DIAGNOSIS — Z8781 Personal history of (healed) traumatic fracture: Secondary | ICD-10-CM | POA: Diagnosis not present

## 2017-08-06 DIAGNOSIS — G40909 Epilepsy, unspecified, not intractable, without status epilepticus: Secondary | ICD-10-CM | POA: Diagnosis not present

## 2017-08-06 DIAGNOSIS — R2681 Unsteadiness on feet: Secondary | ICD-10-CM | POA: Diagnosis not present

## 2017-08-07 DIAGNOSIS — F0390 Unspecified dementia without behavioral disturbance: Secondary | ICD-10-CM | POA: Diagnosis not present

## 2017-08-07 DIAGNOSIS — M159 Polyosteoarthritis, unspecified: Secondary | ICD-10-CM | POA: Diagnosis not present

## 2017-08-07 DIAGNOSIS — M810 Age-related osteoporosis without current pathological fracture: Secondary | ICD-10-CM | POA: Diagnosis not present

## 2017-08-07 DIAGNOSIS — G40909 Epilepsy, unspecified, not intractable, without status epilepticus: Secondary | ICD-10-CM | POA: Diagnosis not present

## 2017-08-07 DIAGNOSIS — Z8781 Personal history of (healed) traumatic fracture: Secondary | ICD-10-CM | POA: Diagnosis not present

## 2017-08-07 DIAGNOSIS — R2681 Unsteadiness on feet: Secondary | ICD-10-CM | POA: Diagnosis not present

## 2017-08-08 DIAGNOSIS — F0390 Unspecified dementia without behavioral disturbance: Secondary | ICD-10-CM | POA: Diagnosis not present

## 2017-08-08 DIAGNOSIS — R2681 Unsteadiness on feet: Secondary | ICD-10-CM | POA: Diagnosis not present

## 2017-08-08 DIAGNOSIS — M810 Age-related osteoporosis without current pathological fracture: Secondary | ICD-10-CM | POA: Diagnosis not present

## 2017-08-08 DIAGNOSIS — M159 Polyosteoarthritis, unspecified: Secondary | ICD-10-CM | POA: Diagnosis not present

## 2017-08-08 DIAGNOSIS — G40909 Epilepsy, unspecified, not intractable, without status epilepticus: Secondary | ICD-10-CM | POA: Diagnosis not present

## 2017-08-08 DIAGNOSIS — Z8781 Personal history of (healed) traumatic fracture: Secondary | ICD-10-CM | POA: Diagnosis not present

## 2017-08-09 DIAGNOSIS — K219 Gastro-esophageal reflux disease without esophagitis: Secondary | ICD-10-CM | POA: Diagnosis not present

## 2017-08-09 DIAGNOSIS — M159 Polyosteoarthritis, unspecified: Secondary | ICD-10-CM | POA: Diagnosis not present

## 2017-08-09 DIAGNOSIS — F0390 Unspecified dementia without behavioral disturbance: Secondary | ICD-10-CM | POA: Diagnosis not present

## 2017-08-09 DIAGNOSIS — R2681 Unsteadiness on feet: Secondary | ICD-10-CM | POA: Diagnosis not present

## 2017-08-09 DIAGNOSIS — G40909 Epilepsy, unspecified, not intractable, without status epilepticus: Secondary | ICD-10-CM | POA: Diagnosis not present

## 2017-08-09 DIAGNOSIS — M810 Age-related osteoporosis without current pathological fracture: Secondary | ICD-10-CM | POA: Diagnosis not present

## 2017-08-09 DIAGNOSIS — Z8781 Personal history of (healed) traumatic fracture: Secondary | ICD-10-CM | POA: Diagnosis not present

## 2017-08-10 DIAGNOSIS — G40909 Epilepsy, unspecified, not intractable, without status epilepticus: Secondary | ICD-10-CM | POA: Diagnosis not present

## 2017-08-10 DIAGNOSIS — F0390 Unspecified dementia without behavioral disturbance: Secondary | ICD-10-CM | POA: Diagnosis not present

## 2017-08-10 DIAGNOSIS — M810 Age-related osteoporosis without current pathological fracture: Secondary | ICD-10-CM | POA: Diagnosis not present

## 2017-08-10 DIAGNOSIS — Z8781 Personal history of (healed) traumatic fracture: Secondary | ICD-10-CM | POA: Diagnosis not present

## 2017-08-10 DIAGNOSIS — R2681 Unsteadiness on feet: Secondary | ICD-10-CM | POA: Diagnosis not present

## 2017-08-10 DIAGNOSIS — M159 Polyosteoarthritis, unspecified: Secondary | ICD-10-CM | POA: Diagnosis not present

## 2017-08-11 DIAGNOSIS — M810 Age-related osteoporosis without current pathological fracture: Secondary | ICD-10-CM | POA: Diagnosis not present

## 2017-08-11 DIAGNOSIS — Z8781 Personal history of (healed) traumatic fracture: Secondary | ICD-10-CM | POA: Diagnosis not present

## 2017-08-11 DIAGNOSIS — M159 Polyosteoarthritis, unspecified: Secondary | ICD-10-CM | POA: Diagnosis not present

## 2017-08-11 DIAGNOSIS — R2681 Unsteadiness on feet: Secondary | ICD-10-CM | POA: Diagnosis not present

## 2017-08-11 DIAGNOSIS — F0390 Unspecified dementia without behavioral disturbance: Secondary | ICD-10-CM | POA: Diagnosis not present

## 2017-08-11 DIAGNOSIS — G40909 Epilepsy, unspecified, not intractable, without status epilepticus: Secondary | ICD-10-CM | POA: Diagnosis not present

## 2017-08-12 DIAGNOSIS — R2681 Unsteadiness on feet: Secondary | ICD-10-CM | POA: Diagnosis not present

## 2017-08-12 DIAGNOSIS — M810 Age-related osteoporosis without current pathological fracture: Secondary | ICD-10-CM | POA: Diagnosis not present

## 2017-08-12 DIAGNOSIS — M159 Polyosteoarthritis, unspecified: Secondary | ICD-10-CM | POA: Diagnosis not present

## 2017-08-12 DIAGNOSIS — F0390 Unspecified dementia without behavioral disturbance: Secondary | ICD-10-CM | POA: Diagnosis not present

## 2017-08-12 DIAGNOSIS — Z8781 Personal history of (healed) traumatic fracture: Secondary | ICD-10-CM | POA: Diagnosis not present

## 2017-08-12 DIAGNOSIS — G40909 Epilepsy, unspecified, not intractable, without status epilepticus: Secondary | ICD-10-CM | POA: Diagnosis not present

## 2017-08-13 DIAGNOSIS — M810 Age-related osteoporosis without current pathological fracture: Secondary | ICD-10-CM | POA: Diagnosis not present

## 2017-08-13 DIAGNOSIS — M159 Polyosteoarthritis, unspecified: Secondary | ICD-10-CM | POA: Diagnosis not present

## 2017-08-13 DIAGNOSIS — F0390 Unspecified dementia without behavioral disturbance: Secondary | ICD-10-CM | POA: Diagnosis not present

## 2017-08-13 DIAGNOSIS — G40909 Epilepsy, unspecified, not intractable, without status epilepticus: Secondary | ICD-10-CM | POA: Diagnosis not present

## 2017-08-13 DIAGNOSIS — Z8781 Personal history of (healed) traumatic fracture: Secondary | ICD-10-CM | POA: Diagnosis not present

## 2017-08-13 DIAGNOSIS — R2681 Unsteadiness on feet: Secondary | ICD-10-CM | POA: Diagnosis not present

## 2017-08-14 DIAGNOSIS — M810 Age-related osteoporosis without current pathological fracture: Secondary | ICD-10-CM | POA: Diagnosis not present

## 2017-08-14 DIAGNOSIS — R2681 Unsteadiness on feet: Secondary | ICD-10-CM | POA: Diagnosis not present

## 2017-08-14 DIAGNOSIS — Z8781 Personal history of (healed) traumatic fracture: Secondary | ICD-10-CM | POA: Diagnosis not present

## 2017-08-14 DIAGNOSIS — G40909 Epilepsy, unspecified, not intractable, without status epilepticus: Secondary | ICD-10-CM | POA: Diagnosis not present

## 2017-08-14 DIAGNOSIS — M159 Polyosteoarthritis, unspecified: Secondary | ICD-10-CM | POA: Diagnosis not present

## 2017-08-14 DIAGNOSIS — F0390 Unspecified dementia without behavioral disturbance: Secondary | ICD-10-CM | POA: Diagnosis not present

## 2017-08-15 DIAGNOSIS — I10 Essential (primary) hypertension: Secondary | ICD-10-CM | POA: Diagnosis not present

## 2017-08-15 DIAGNOSIS — F329 Major depressive disorder, single episode, unspecified: Secondary | ICD-10-CM | POA: Diagnosis not present

## 2017-08-15 DIAGNOSIS — R2681 Unsteadiness on feet: Secondary | ICD-10-CM | POA: Diagnosis not present

## 2017-08-15 DIAGNOSIS — G40909 Epilepsy, unspecified, not intractable, without status epilepticus: Secondary | ICD-10-CM | POA: Diagnosis not present

## 2017-08-15 DIAGNOSIS — M810 Age-related osteoporosis without current pathological fracture: Secondary | ICD-10-CM | POA: Diagnosis not present

## 2017-08-15 DIAGNOSIS — F0390 Unspecified dementia without behavioral disturbance: Secondary | ICD-10-CM | POA: Diagnosis not present

## 2017-08-15 DIAGNOSIS — Z8781 Personal history of (healed) traumatic fracture: Secondary | ICD-10-CM | POA: Diagnosis not present

## 2017-08-15 DIAGNOSIS — M159 Polyosteoarthritis, unspecified: Secondary | ICD-10-CM | POA: Diagnosis not present

## 2017-08-16 DIAGNOSIS — F0390 Unspecified dementia without behavioral disturbance: Secondary | ICD-10-CM | POA: Diagnosis not present

## 2017-08-16 DIAGNOSIS — M810 Age-related osteoporosis without current pathological fracture: Secondary | ICD-10-CM | POA: Diagnosis not present

## 2017-08-16 DIAGNOSIS — R2681 Unsteadiness on feet: Secondary | ICD-10-CM | POA: Diagnosis not present

## 2017-08-16 DIAGNOSIS — M159 Polyosteoarthritis, unspecified: Secondary | ICD-10-CM | POA: Diagnosis not present

## 2017-08-16 DIAGNOSIS — G40909 Epilepsy, unspecified, not intractable, without status epilepticus: Secondary | ICD-10-CM | POA: Diagnosis not present

## 2017-08-16 DIAGNOSIS — Z8781 Personal history of (healed) traumatic fracture: Secondary | ICD-10-CM | POA: Diagnosis not present

## 2017-08-17 DIAGNOSIS — R41841 Cognitive communication deficit: Secondary | ICD-10-CM | POA: Diagnosis not present

## 2017-08-17 DIAGNOSIS — M6281 Muscle weakness (generalized): Secondary | ICD-10-CM | POA: Diagnosis not present

## 2017-08-17 DIAGNOSIS — Z8781 Personal history of (healed) traumatic fracture: Secondary | ICD-10-CM | POA: Diagnosis not present

## 2017-08-17 DIAGNOSIS — M159 Polyosteoarthritis, unspecified: Secondary | ICD-10-CM | POA: Diagnosis not present

## 2017-08-17 DIAGNOSIS — M810 Age-related osteoporosis without current pathological fracture: Secondary | ICD-10-CM | POA: Diagnosis not present

## 2017-08-17 DIAGNOSIS — F0390 Unspecified dementia without behavioral disturbance: Secondary | ICD-10-CM | POA: Diagnosis not present

## 2017-08-17 DIAGNOSIS — G40909 Epilepsy, unspecified, not intractable, without status epilepticus: Secondary | ICD-10-CM | POA: Diagnosis not present

## 2017-08-17 DIAGNOSIS — R2681 Unsteadiness on feet: Secondary | ICD-10-CM | POA: Diagnosis not present

## 2017-08-18 DIAGNOSIS — R2681 Unsteadiness on feet: Secondary | ICD-10-CM | POA: Diagnosis not present

## 2017-08-18 DIAGNOSIS — M159 Polyosteoarthritis, unspecified: Secondary | ICD-10-CM | POA: Diagnosis not present

## 2017-08-18 DIAGNOSIS — F0390 Unspecified dementia without behavioral disturbance: Secondary | ICD-10-CM | POA: Diagnosis not present

## 2017-08-18 DIAGNOSIS — G40909 Epilepsy, unspecified, not intractable, without status epilepticus: Secondary | ICD-10-CM | POA: Diagnosis not present

## 2017-08-18 DIAGNOSIS — M810 Age-related osteoporosis without current pathological fracture: Secondary | ICD-10-CM | POA: Diagnosis not present

## 2017-08-18 DIAGNOSIS — Z8781 Personal history of (healed) traumatic fracture: Secondary | ICD-10-CM | POA: Diagnosis not present

## 2017-08-19 DIAGNOSIS — F0390 Unspecified dementia without behavioral disturbance: Secondary | ICD-10-CM | POA: Diagnosis not present

## 2017-08-19 DIAGNOSIS — M810 Age-related osteoporosis without current pathological fracture: Secondary | ICD-10-CM | POA: Diagnosis not present

## 2017-08-19 DIAGNOSIS — Z8781 Personal history of (healed) traumatic fracture: Secondary | ICD-10-CM | POA: Diagnosis not present

## 2017-08-19 DIAGNOSIS — R2681 Unsteadiness on feet: Secondary | ICD-10-CM | POA: Diagnosis not present

## 2017-08-19 DIAGNOSIS — G40909 Epilepsy, unspecified, not intractable, without status epilepticus: Secondary | ICD-10-CM | POA: Diagnosis not present

## 2017-08-19 DIAGNOSIS — M159 Polyosteoarthritis, unspecified: Secondary | ICD-10-CM | POA: Diagnosis not present

## 2017-08-21 DIAGNOSIS — R569 Unspecified convulsions: Secondary | ICD-10-CM | POA: Diagnosis not present

## 2017-08-21 DIAGNOSIS — F0390 Unspecified dementia without behavioral disturbance: Secondary | ICD-10-CM | POA: Diagnosis not present

## 2017-08-21 DIAGNOSIS — M159 Polyosteoarthritis, unspecified: Secondary | ICD-10-CM | POA: Diagnosis not present

## 2017-08-21 DIAGNOSIS — Z8781 Personal history of (healed) traumatic fracture: Secondary | ICD-10-CM | POA: Diagnosis not present

## 2017-08-21 DIAGNOSIS — I10 Essential (primary) hypertension: Secondary | ICD-10-CM | POA: Diagnosis not present

## 2017-08-21 DIAGNOSIS — F445 Conversion disorder with seizures or convulsions: Secondary | ICD-10-CM | POA: Diagnosis not present

## 2017-08-21 DIAGNOSIS — E559 Vitamin D deficiency, unspecified: Secondary | ICD-10-CM | POA: Diagnosis not present

## 2017-08-21 DIAGNOSIS — R2681 Unsteadiness on feet: Secondary | ICD-10-CM | POA: Diagnosis not present

## 2017-08-21 DIAGNOSIS — G40909 Epilepsy, unspecified, not intractable, without status epilepticus: Secondary | ICD-10-CM | POA: Diagnosis not present

## 2017-08-21 DIAGNOSIS — E039 Hypothyroidism, unspecified: Secondary | ICD-10-CM | POA: Diagnosis not present

## 2017-08-21 DIAGNOSIS — E119 Type 2 diabetes mellitus without complications: Secondary | ICD-10-CM | POA: Diagnosis not present

## 2017-08-21 DIAGNOSIS — D649 Anemia, unspecified: Secondary | ICD-10-CM | POA: Diagnosis not present

## 2017-08-21 DIAGNOSIS — M810 Age-related osteoporosis without current pathological fracture: Secondary | ICD-10-CM | POA: Diagnosis not present

## 2017-08-22 DIAGNOSIS — M159 Polyosteoarthritis, unspecified: Secondary | ICD-10-CM | POA: Diagnosis not present

## 2017-08-22 DIAGNOSIS — Z8781 Personal history of (healed) traumatic fracture: Secondary | ICD-10-CM | POA: Diagnosis not present

## 2017-08-22 DIAGNOSIS — F0390 Unspecified dementia without behavioral disturbance: Secondary | ICD-10-CM | POA: Diagnosis not present

## 2017-08-22 DIAGNOSIS — G40909 Epilepsy, unspecified, not intractable, without status epilepticus: Secondary | ICD-10-CM | POA: Diagnosis not present

## 2017-08-22 DIAGNOSIS — R2681 Unsteadiness on feet: Secondary | ICD-10-CM | POA: Diagnosis not present

## 2017-08-22 DIAGNOSIS — M810 Age-related osteoporosis without current pathological fracture: Secondary | ICD-10-CM | POA: Diagnosis not present

## 2017-08-23 DIAGNOSIS — M810 Age-related osteoporosis without current pathological fracture: Secondary | ICD-10-CM | POA: Diagnosis not present

## 2017-08-23 DIAGNOSIS — Z8781 Personal history of (healed) traumatic fracture: Secondary | ICD-10-CM | POA: Diagnosis not present

## 2017-08-23 DIAGNOSIS — M159 Polyosteoarthritis, unspecified: Secondary | ICD-10-CM | POA: Diagnosis not present

## 2017-08-23 DIAGNOSIS — F0390 Unspecified dementia without behavioral disturbance: Secondary | ICD-10-CM | POA: Diagnosis not present

## 2017-08-23 DIAGNOSIS — R2681 Unsteadiness on feet: Secondary | ICD-10-CM | POA: Diagnosis not present

## 2017-08-23 DIAGNOSIS — G40909 Epilepsy, unspecified, not intractable, without status epilepticus: Secondary | ICD-10-CM | POA: Diagnosis not present

## 2017-08-24 DIAGNOSIS — G40909 Epilepsy, unspecified, not intractable, without status epilepticus: Secondary | ICD-10-CM | POA: Diagnosis not present

## 2017-08-24 DIAGNOSIS — R2681 Unsteadiness on feet: Secondary | ICD-10-CM | POA: Diagnosis not present

## 2017-08-24 DIAGNOSIS — Z8781 Personal history of (healed) traumatic fracture: Secondary | ICD-10-CM | POA: Diagnosis not present

## 2017-08-24 DIAGNOSIS — F0390 Unspecified dementia without behavioral disturbance: Secondary | ICD-10-CM | POA: Diagnosis not present

## 2017-08-24 DIAGNOSIS — M159 Polyosteoarthritis, unspecified: Secondary | ICD-10-CM | POA: Diagnosis not present

## 2017-08-24 DIAGNOSIS — M810 Age-related osteoporosis without current pathological fracture: Secondary | ICD-10-CM | POA: Diagnosis not present

## 2017-08-25 DIAGNOSIS — G40909 Epilepsy, unspecified, not intractable, without status epilepticus: Secondary | ICD-10-CM | POA: Diagnosis not present

## 2017-08-25 DIAGNOSIS — M159 Polyosteoarthritis, unspecified: Secondary | ICD-10-CM | POA: Diagnosis not present

## 2017-08-25 DIAGNOSIS — R2681 Unsteadiness on feet: Secondary | ICD-10-CM | POA: Diagnosis not present

## 2017-08-25 DIAGNOSIS — F0390 Unspecified dementia without behavioral disturbance: Secondary | ICD-10-CM | POA: Diagnosis not present

## 2017-08-25 DIAGNOSIS — Z8781 Personal history of (healed) traumatic fracture: Secondary | ICD-10-CM | POA: Diagnosis not present

## 2017-08-25 DIAGNOSIS — M810 Age-related osteoporosis without current pathological fracture: Secondary | ICD-10-CM | POA: Diagnosis not present

## 2017-08-26 DIAGNOSIS — M159 Polyosteoarthritis, unspecified: Secondary | ICD-10-CM | POA: Diagnosis not present

## 2017-08-26 DIAGNOSIS — G40909 Epilepsy, unspecified, not intractable, without status epilepticus: Secondary | ICD-10-CM | POA: Diagnosis not present

## 2017-08-26 DIAGNOSIS — Z8781 Personal history of (healed) traumatic fracture: Secondary | ICD-10-CM | POA: Diagnosis not present

## 2017-08-26 DIAGNOSIS — F0390 Unspecified dementia without behavioral disturbance: Secondary | ICD-10-CM | POA: Diagnosis not present

## 2017-08-26 DIAGNOSIS — R2681 Unsteadiness on feet: Secondary | ICD-10-CM | POA: Diagnosis not present

## 2017-08-26 DIAGNOSIS — M810 Age-related osteoporosis without current pathological fracture: Secondary | ICD-10-CM | POA: Diagnosis not present

## 2017-08-28 DIAGNOSIS — M810 Age-related osteoporosis without current pathological fracture: Secondary | ICD-10-CM | POA: Diagnosis not present

## 2017-08-28 DIAGNOSIS — R2681 Unsteadiness on feet: Secondary | ICD-10-CM | POA: Diagnosis not present

## 2017-08-28 DIAGNOSIS — Z8781 Personal history of (healed) traumatic fracture: Secondary | ICD-10-CM | POA: Diagnosis not present

## 2017-08-28 DIAGNOSIS — G40909 Epilepsy, unspecified, not intractable, without status epilepticus: Secondary | ICD-10-CM | POA: Diagnosis not present

## 2017-08-28 DIAGNOSIS — F0390 Unspecified dementia without behavioral disturbance: Secondary | ICD-10-CM | POA: Diagnosis not present

## 2017-08-28 DIAGNOSIS — M159 Polyosteoarthritis, unspecified: Secondary | ICD-10-CM | POA: Diagnosis not present

## 2017-08-29 DIAGNOSIS — F0390 Unspecified dementia without behavioral disturbance: Secondary | ICD-10-CM | POA: Diagnosis not present

## 2017-08-29 DIAGNOSIS — G40909 Epilepsy, unspecified, not intractable, without status epilepticus: Secondary | ICD-10-CM | POA: Diagnosis not present

## 2017-08-29 DIAGNOSIS — Z8781 Personal history of (healed) traumatic fracture: Secondary | ICD-10-CM | POA: Diagnosis not present

## 2017-08-29 DIAGNOSIS — M810 Age-related osteoporosis without current pathological fracture: Secondary | ICD-10-CM | POA: Diagnosis not present

## 2017-08-29 DIAGNOSIS — M159 Polyosteoarthritis, unspecified: Secondary | ICD-10-CM | POA: Diagnosis not present

## 2017-08-29 DIAGNOSIS — R2681 Unsteadiness on feet: Secondary | ICD-10-CM | POA: Diagnosis not present

## 2017-08-30 DIAGNOSIS — G40909 Epilepsy, unspecified, not intractable, without status epilepticus: Secondary | ICD-10-CM | POA: Diagnosis not present

## 2017-08-30 DIAGNOSIS — M159 Polyosteoarthritis, unspecified: Secondary | ICD-10-CM | POA: Diagnosis not present

## 2017-08-30 DIAGNOSIS — R2681 Unsteadiness on feet: Secondary | ICD-10-CM | POA: Diagnosis not present

## 2017-08-30 DIAGNOSIS — M810 Age-related osteoporosis without current pathological fracture: Secondary | ICD-10-CM | POA: Diagnosis not present

## 2017-08-30 DIAGNOSIS — F0390 Unspecified dementia without behavioral disturbance: Secondary | ICD-10-CM | POA: Diagnosis not present

## 2017-08-30 DIAGNOSIS — Z8781 Personal history of (healed) traumatic fracture: Secondary | ICD-10-CM | POA: Diagnosis not present

## 2017-08-31 DIAGNOSIS — M159 Polyosteoarthritis, unspecified: Secondary | ICD-10-CM | POA: Diagnosis not present

## 2017-08-31 DIAGNOSIS — M810 Age-related osteoporosis without current pathological fracture: Secondary | ICD-10-CM | POA: Diagnosis not present

## 2017-08-31 DIAGNOSIS — Z8781 Personal history of (healed) traumatic fracture: Secondary | ICD-10-CM | POA: Diagnosis not present

## 2017-08-31 DIAGNOSIS — G40909 Epilepsy, unspecified, not intractable, without status epilepticus: Secondary | ICD-10-CM | POA: Diagnosis not present

## 2017-08-31 DIAGNOSIS — F0390 Unspecified dementia without behavioral disturbance: Secondary | ICD-10-CM | POA: Diagnosis not present

## 2017-08-31 DIAGNOSIS — R2681 Unsteadiness on feet: Secondary | ICD-10-CM | POA: Diagnosis not present

## 2017-09-01 DIAGNOSIS — G40909 Epilepsy, unspecified, not intractable, without status epilepticus: Secondary | ICD-10-CM | POA: Diagnosis not present

## 2017-09-01 DIAGNOSIS — M159 Polyosteoarthritis, unspecified: Secondary | ICD-10-CM | POA: Diagnosis not present

## 2017-09-01 DIAGNOSIS — Z8781 Personal history of (healed) traumatic fracture: Secondary | ICD-10-CM | POA: Diagnosis not present

## 2017-09-01 DIAGNOSIS — F0390 Unspecified dementia without behavioral disturbance: Secondary | ICD-10-CM | POA: Diagnosis not present

## 2017-09-01 DIAGNOSIS — M810 Age-related osteoporosis without current pathological fracture: Secondary | ICD-10-CM | POA: Diagnosis not present

## 2017-09-01 DIAGNOSIS — R2681 Unsteadiness on feet: Secondary | ICD-10-CM | POA: Diagnosis not present

## 2017-09-03 DIAGNOSIS — M810 Age-related osteoporosis without current pathological fracture: Secondary | ICD-10-CM | POA: Diagnosis not present

## 2017-09-03 DIAGNOSIS — F0390 Unspecified dementia without behavioral disturbance: Secondary | ICD-10-CM | POA: Diagnosis not present

## 2017-09-03 DIAGNOSIS — R2681 Unsteadiness on feet: Secondary | ICD-10-CM | POA: Diagnosis not present

## 2017-09-03 DIAGNOSIS — M159 Polyosteoarthritis, unspecified: Secondary | ICD-10-CM | POA: Diagnosis not present

## 2017-09-03 DIAGNOSIS — Z8781 Personal history of (healed) traumatic fracture: Secondary | ICD-10-CM | POA: Diagnosis not present

## 2017-09-03 DIAGNOSIS — G40909 Epilepsy, unspecified, not intractable, without status epilepticus: Secondary | ICD-10-CM | POA: Diagnosis not present

## 2017-09-04 DIAGNOSIS — M159 Polyosteoarthritis, unspecified: Secondary | ICD-10-CM | POA: Diagnosis not present

## 2017-09-04 DIAGNOSIS — Z8781 Personal history of (healed) traumatic fracture: Secondary | ICD-10-CM | POA: Diagnosis not present

## 2017-09-04 DIAGNOSIS — G40909 Epilepsy, unspecified, not intractable, without status epilepticus: Secondary | ICD-10-CM | POA: Diagnosis not present

## 2017-09-04 DIAGNOSIS — R2681 Unsteadiness on feet: Secondary | ICD-10-CM | POA: Diagnosis not present

## 2017-09-04 DIAGNOSIS — M810 Age-related osteoporosis without current pathological fracture: Secondary | ICD-10-CM | POA: Diagnosis not present

## 2017-09-04 DIAGNOSIS — F0390 Unspecified dementia without behavioral disturbance: Secondary | ICD-10-CM | POA: Diagnosis not present

## 2017-09-05 DIAGNOSIS — Z8781 Personal history of (healed) traumatic fracture: Secondary | ICD-10-CM | POA: Diagnosis not present

## 2017-09-05 DIAGNOSIS — G40909 Epilepsy, unspecified, not intractable, without status epilepticus: Secondary | ICD-10-CM | POA: Diagnosis not present

## 2017-09-05 DIAGNOSIS — M159 Polyosteoarthritis, unspecified: Secondary | ICD-10-CM | POA: Diagnosis not present

## 2017-09-05 DIAGNOSIS — F0390 Unspecified dementia without behavioral disturbance: Secondary | ICD-10-CM | POA: Diagnosis not present

## 2017-09-05 DIAGNOSIS — M810 Age-related osteoporosis without current pathological fracture: Secondary | ICD-10-CM | POA: Diagnosis not present

## 2017-09-05 DIAGNOSIS — R2681 Unsteadiness on feet: Secondary | ICD-10-CM | POA: Diagnosis not present

## 2017-09-06 DIAGNOSIS — M810 Age-related osteoporosis without current pathological fracture: Secondary | ICD-10-CM | POA: Diagnosis not present

## 2017-09-06 DIAGNOSIS — F0390 Unspecified dementia without behavioral disturbance: Secondary | ICD-10-CM | POA: Diagnosis not present

## 2017-09-06 DIAGNOSIS — R2681 Unsteadiness on feet: Secondary | ICD-10-CM | POA: Diagnosis not present

## 2017-09-06 DIAGNOSIS — M159 Polyosteoarthritis, unspecified: Secondary | ICD-10-CM | POA: Diagnosis not present

## 2017-09-06 DIAGNOSIS — G40909 Epilepsy, unspecified, not intractable, without status epilepticus: Secondary | ICD-10-CM | POA: Diagnosis not present

## 2017-09-06 DIAGNOSIS — Z8781 Personal history of (healed) traumatic fracture: Secondary | ICD-10-CM | POA: Diagnosis not present

## 2017-09-07 DIAGNOSIS — F0390 Unspecified dementia without behavioral disturbance: Secondary | ICD-10-CM | POA: Diagnosis not present

## 2017-09-07 DIAGNOSIS — R2681 Unsteadiness on feet: Secondary | ICD-10-CM | POA: Diagnosis not present

## 2017-09-07 DIAGNOSIS — M810 Age-related osteoporosis without current pathological fracture: Secondary | ICD-10-CM | POA: Diagnosis not present

## 2017-09-07 DIAGNOSIS — M159 Polyosteoarthritis, unspecified: Secondary | ICD-10-CM | POA: Diagnosis not present

## 2017-09-07 DIAGNOSIS — Z8781 Personal history of (healed) traumatic fracture: Secondary | ICD-10-CM | POA: Diagnosis not present

## 2017-09-07 DIAGNOSIS — G40909 Epilepsy, unspecified, not intractable, without status epilepticus: Secondary | ICD-10-CM | POA: Diagnosis not present

## 2017-09-08 DIAGNOSIS — Z8781 Personal history of (healed) traumatic fracture: Secondary | ICD-10-CM | POA: Diagnosis not present

## 2017-09-08 DIAGNOSIS — M159 Polyosteoarthritis, unspecified: Secondary | ICD-10-CM | POA: Diagnosis not present

## 2017-09-08 DIAGNOSIS — M810 Age-related osteoporosis without current pathological fracture: Secondary | ICD-10-CM | POA: Diagnosis not present

## 2017-09-08 DIAGNOSIS — F0390 Unspecified dementia without behavioral disturbance: Secondary | ICD-10-CM | POA: Diagnosis not present

## 2017-09-08 DIAGNOSIS — R2681 Unsteadiness on feet: Secondary | ICD-10-CM | POA: Diagnosis not present

## 2017-09-08 DIAGNOSIS — G40909 Epilepsy, unspecified, not intractable, without status epilepticus: Secondary | ICD-10-CM | POA: Diagnosis not present

## 2017-09-09 DIAGNOSIS — Z8781 Personal history of (healed) traumatic fracture: Secondary | ICD-10-CM | POA: Diagnosis not present

## 2017-09-09 DIAGNOSIS — F0390 Unspecified dementia without behavioral disturbance: Secondary | ICD-10-CM | POA: Diagnosis not present

## 2017-09-09 DIAGNOSIS — G40909 Epilepsy, unspecified, not intractable, without status epilepticus: Secondary | ICD-10-CM | POA: Diagnosis not present

## 2017-09-09 DIAGNOSIS — M810 Age-related osteoporosis without current pathological fracture: Secondary | ICD-10-CM | POA: Diagnosis not present

## 2017-09-09 DIAGNOSIS — R2681 Unsteadiness on feet: Secondary | ICD-10-CM | POA: Diagnosis not present

## 2017-09-09 DIAGNOSIS — M159 Polyosteoarthritis, unspecified: Secondary | ICD-10-CM | POA: Diagnosis not present

## 2017-09-10 DIAGNOSIS — R2681 Unsteadiness on feet: Secondary | ICD-10-CM | POA: Diagnosis not present

## 2017-09-10 DIAGNOSIS — Z8781 Personal history of (healed) traumatic fracture: Secondary | ICD-10-CM | POA: Diagnosis not present

## 2017-09-10 DIAGNOSIS — F0390 Unspecified dementia without behavioral disturbance: Secondary | ICD-10-CM | POA: Diagnosis not present

## 2017-09-10 DIAGNOSIS — G40909 Epilepsy, unspecified, not intractable, without status epilepticus: Secondary | ICD-10-CM | POA: Diagnosis not present

## 2017-09-10 DIAGNOSIS — M159 Polyosteoarthritis, unspecified: Secondary | ICD-10-CM | POA: Diagnosis not present

## 2017-09-10 DIAGNOSIS — M810 Age-related osteoporosis without current pathological fracture: Secondary | ICD-10-CM | POA: Diagnosis not present

## 2017-09-11 DIAGNOSIS — M810 Age-related osteoporosis without current pathological fracture: Secondary | ICD-10-CM | POA: Diagnosis not present

## 2017-09-11 DIAGNOSIS — M159 Polyosteoarthritis, unspecified: Secondary | ICD-10-CM | POA: Diagnosis not present

## 2017-09-11 DIAGNOSIS — F0390 Unspecified dementia without behavioral disturbance: Secondary | ICD-10-CM | POA: Diagnosis not present

## 2017-09-11 DIAGNOSIS — Z8781 Personal history of (healed) traumatic fracture: Secondary | ICD-10-CM | POA: Diagnosis not present

## 2017-09-11 DIAGNOSIS — G40909 Epilepsy, unspecified, not intractable, without status epilepticus: Secondary | ICD-10-CM | POA: Diagnosis not present

## 2017-09-11 DIAGNOSIS — R2681 Unsteadiness on feet: Secondary | ICD-10-CM | POA: Diagnosis not present

## 2017-09-12 DIAGNOSIS — M810 Age-related osteoporosis without current pathological fracture: Secondary | ICD-10-CM | POA: Diagnosis not present

## 2017-09-12 DIAGNOSIS — F329 Major depressive disorder, single episode, unspecified: Secondary | ICD-10-CM | POA: Diagnosis not present

## 2017-09-12 DIAGNOSIS — F0390 Unspecified dementia without behavioral disturbance: Secondary | ICD-10-CM | POA: Diagnosis not present

## 2017-09-12 DIAGNOSIS — R2681 Unsteadiness on feet: Secondary | ICD-10-CM | POA: Diagnosis not present

## 2017-09-12 DIAGNOSIS — Z8781 Personal history of (healed) traumatic fracture: Secondary | ICD-10-CM | POA: Diagnosis not present

## 2017-09-12 DIAGNOSIS — G40909 Epilepsy, unspecified, not intractable, without status epilepticus: Secondary | ICD-10-CM | POA: Diagnosis not present

## 2017-09-12 DIAGNOSIS — M159 Polyosteoarthritis, unspecified: Secondary | ICD-10-CM | POA: Diagnosis not present

## 2017-09-13 DIAGNOSIS — G40909 Epilepsy, unspecified, not intractable, without status epilepticus: Secondary | ICD-10-CM | POA: Diagnosis not present

## 2017-09-13 DIAGNOSIS — M159 Polyosteoarthritis, unspecified: Secondary | ICD-10-CM | POA: Diagnosis not present

## 2017-09-13 DIAGNOSIS — R2681 Unsteadiness on feet: Secondary | ICD-10-CM | POA: Diagnosis not present

## 2017-09-13 DIAGNOSIS — F0390 Unspecified dementia without behavioral disturbance: Secondary | ICD-10-CM | POA: Diagnosis not present

## 2017-09-13 DIAGNOSIS — Z8781 Personal history of (healed) traumatic fracture: Secondary | ICD-10-CM | POA: Diagnosis not present

## 2017-09-13 DIAGNOSIS — M810 Age-related osteoporosis without current pathological fracture: Secondary | ICD-10-CM | POA: Diagnosis not present

## 2017-09-14 DIAGNOSIS — F0390 Unspecified dementia without behavioral disturbance: Secondary | ICD-10-CM | POA: Diagnosis not present

## 2017-09-14 DIAGNOSIS — R2681 Unsteadiness on feet: Secondary | ICD-10-CM | POA: Diagnosis not present

## 2017-09-14 DIAGNOSIS — Z8781 Personal history of (healed) traumatic fracture: Secondary | ICD-10-CM | POA: Diagnosis not present

## 2017-09-14 DIAGNOSIS — M810 Age-related osteoporosis without current pathological fracture: Secondary | ICD-10-CM | POA: Diagnosis not present

## 2017-09-14 DIAGNOSIS — M159 Polyosteoarthritis, unspecified: Secondary | ICD-10-CM | POA: Diagnosis not present

## 2017-09-14 DIAGNOSIS — G40909 Epilepsy, unspecified, not intractable, without status epilepticus: Secondary | ICD-10-CM | POA: Diagnosis not present

## 2017-09-15 DIAGNOSIS — Z8781 Personal history of (healed) traumatic fracture: Secondary | ICD-10-CM | POA: Diagnosis not present

## 2017-09-15 DIAGNOSIS — F0390 Unspecified dementia without behavioral disturbance: Secondary | ICD-10-CM | POA: Diagnosis not present

## 2017-09-15 DIAGNOSIS — G40909 Epilepsy, unspecified, not intractable, without status epilepticus: Secondary | ICD-10-CM | POA: Diagnosis not present

## 2017-09-15 DIAGNOSIS — M159 Polyosteoarthritis, unspecified: Secondary | ICD-10-CM | POA: Diagnosis not present

## 2017-09-15 DIAGNOSIS — M810 Age-related osteoporosis without current pathological fracture: Secondary | ICD-10-CM | POA: Diagnosis not present

## 2017-09-15 DIAGNOSIS — R2681 Unsteadiness on feet: Secondary | ICD-10-CM | POA: Diagnosis not present

## 2017-09-16 DIAGNOSIS — M810 Age-related osteoporosis without current pathological fracture: Secondary | ICD-10-CM | POA: Diagnosis not present

## 2017-09-16 DIAGNOSIS — R2681 Unsteadiness on feet: Secondary | ICD-10-CM | POA: Diagnosis not present

## 2017-09-16 DIAGNOSIS — Z8781 Personal history of (healed) traumatic fracture: Secondary | ICD-10-CM | POA: Diagnosis not present

## 2017-09-16 DIAGNOSIS — M159 Polyosteoarthritis, unspecified: Secondary | ICD-10-CM | POA: Diagnosis not present

## 2017-09-16 DIAGNOSIS — F0390 Unspecified dementia without behavioral disturbance: Secondary | ICD-10-CM | POA: Diagnosis not present

## 2017-09-16 DIAGNOSIS — G40909 Epilepsy, unspecified, not intractable, without status epilepticus: Secondary | ICD-10-CM | POA: Diagnosis not present

## 2017-09-18 DIAGNOSIS — Z9181 History of falling: Secondary | ICD-10-CM | POA: Diagnosis not present

## 2017-09-18 DIAGNOSIS — F0390 Unspecified dementia without behavioral disturbance: Secondary | ICD-10-CM | POA: Diagnosis not present

## 2017-09-18 DIAGNOSIS — R2681 Unsteadiness on feet: Secondary | ICD-10-CM | POA: Diagnosis not present

## 2017-09-18 DIAGNOSIS — M810 Age-related osteoporosis without current pathological fracture: Secondary | ICD-10-CM | POA: Diagnosis not present

## 2017-09-18 DIAGNOSIS — M6281 Muscle weakness (generalized): Secondary | ICD-10-CM | POA: Diagnosis not present

## 2017-09-18 DIAGNOSIS — R41841 Cognitive communication deficit: Secondary | ICD-10-CM | POA: Diagnosis not present

## 2017-09-18 DIAGNOSIS — Z8781 Personal history of (healed) traumatic fracture: Secondary | ICD-10-CM | POA: Diagnosis not present

## 2017-09-18 DIAGNOSIS — G40909 Epilepsy, unspecified, not intractable, without status epilepticus: Secondary | ICD-10-CM | POA: Diagnosis not present

## 2017-09-18 DIAGNOSIS — M159 Polyosteoarthritis, unspecified: Secondary | ICD-10-CM | POA: Diagnosis not present

## 2017-09-19 DIAGNOSIS — R2681 Unsteadiness on feet: Secondary | ICD-10-CM | POA: Diagnosis not present

## 2017-09-19 DIAGNOSIS — M810 Age-related osteoporosis without current pathological fracture: Secondary | ICD-10-CM | POA: Diagnosis not present

## 2017-09-19 DIAGNOSIS — M159 Polyosteoarthritis, unspecified: Secondary | ICD-10-CM | POA: Diagnosis not present

## 2017-09-19 DIAGNOSIS — F0390 Unspecified dementia without behavioral disturbance: Secondary | ICD-10-CM | POA: Diagnosis not present

## 2017-09-19 DIAGNOSIS — Z8781 Personal history of (healed) traumatic fracture: Secondary | ICD-10-CM | POA: Diagnosis not present

## 2017-09-19 DIAGNOSIS — G40909 Epilepsy, unspecified, not intractable, without status epilepticus: Secondary | ICD-10-CM | POA: Diagnosis not present

## 2017-09-20 DIAGNOSIS — Z8781 Personal history of (healed) traumatic fracture: Secondary | ICD-10-CM | POA: Diagnosis not present

## 2017-09-20 DIAGNOSIS — F0390 Unspecified dementia without behavioral disturbance: Secondary | ICD-10-CM | POA: Diagnosis not present

## 2017-09-20 DIAGNOSIS — G40909 Epilepsy, unspecified, not intractable, without status epilepticus: Secondary | ICD-10-CM | POA: Diagnosis not present

## 2017-09-20 DIAGNOSIS — M810 Age-related osteoporosis without current pathological fracture: Secondary | ICD-10-CM | POA: Diagnosis not present

## 2017-09-20 DIAGNOSIS — R2681 Unsteadiness on feet: Secondary | ICD-10-CM | POA: Diagnosis not present

## 2017-09-20 DIAGNOSIS — M159 Polyosteoarthritis, unspecified: Secondary | ICD-10-CM | POA: Diagnosis not present

## 2017-09-21 DIAGNOSIS — F0390 Unspecified dementia without behavioral disturbance: Secondary | ICD-10-CM | POA: Diagnosis not present

## 2017-09-21 DIAGNOSIS — G40909 Epilepsy, unspecified, not intractable, without status epilepticus: Secondary | ICD-10-CM | POA: Diagnosis not present

## 2017-09-21 DIAGNOSIS — M810 Age-related osteoporosis without current pathological fracture: Secondary | ICD-10-CM | POA: Diagnosis not present

## 2017-09-21 DIAGNOSIS — Z8781 Personal history of (healed) traumatic fracture: Secondary | ICD-10-CM | POA: Diagnosis not present

## 2017-09-21 DIAGNOSIS — R2681 Unsteadiness on feet: Secondary | ICD-10-CM | POA: Diagnosis not present

## 2017-09-21 DIAGNOSIS — M159 Polyosteoarthritis, unspecified: Secondary | ICD-10-CM | POA: Diagnosis not present

## 2017-09-22 DIAGNOSIS — R2681 Unsteadiness on feet: Secondary | ICD-10-CM | POA: Diagnosis not present

## 2017-09-22 DIAGNOSIS — M159 Polyosteoarthritis, unspecified: Secondary | ICD-10-CM | POA: Diagnosis not present

## 2017-09-22 DIAGNOSIS — F0390 Unspecified dementia without behavioral disturbance: Secondary | ICD-10-CM | POA: Diagnosis not present

## 2017-09-22 DIAGNOSIS — M810 Age-related osteoporosis without current pathological fracture: Secondary | ICD-10-CM | POA: Diagnosis not present

## 2017-09-22 DIAGNOSIS — Z8781 Personal history of (healed) traumatic fracture: Secondary | ICD-10-CM | POA: Diagnosis not present

## 2017-09-22 DIAGNOSIS — G40909 Epilepsy, unspecified, not intractable, without status epilepticus: Secondary | ICD-10-CM | POA: Diagnosis not present

## 2017-09-25 DIAGNOSIS — F0390 Unspecified dementia without behavioral disturbance: Secondary | ICD-10-CM | POA: Diagnosis not present

## 2017-09-25 DIAGNOSIS — G40909 Epilepsy, unspecified, not intractable, without status epilepticus: Secondary | ICD-10-CM | POA: Diagnosis not present

## 2017-09-25 DIAGNOSIS — M810 Age-related osteoporosis without current pathological fracture: Secondary | ICD-10-CM | POA: Diagnosis not present

## 2017-09-25 DIAGNOSIS — R2681 Unsteadiness on feet: Secondary | ICD-10-CM | POA: Diagnosis not present

## 2017-09-25 DIAGNOSIS — M159 Polyosteoarthritis, unspecified: Secondary | ICD-10-CM | POA: Diagnosis not present

## 2017-09-25 DIAGNOSIS — Z8781 Personal history of (healed) traumatic fracture: Secondary | ICD-10-CM | POA: Diagnosis not present

## 2017-09-26 DIAGNOSIS — Z8781 Personal history of (healed) traumatic fracture: Secondary | ICD-10-CM | POA: Diagnosis not present

## 2017-09-26 DIAGNOSIS — M159 Polyosteoarthritis, unspecified: Secondary | ICD-10-CM | POA: Diagnosis not present

## 2017-09-26 DIAGNOSIS — G40909 Epilepsy, unspecified, not intractable, without status epilepticus: Secondary | ICD-10-CM | POA: Diagnosis not present

## 2017-09-26 DIAGNOSIS — F0390 Unspecified dementia without behavioral disturbance: Secondary | ICD-10-CM | POA: Diagnosis not present

## 2017-09-26 DIAGNOSIS — R2681 Unsteadiness on feet: Secondary | ICD-10-CM | POA: Diagnosis not present

## 2017-09-26 DIAGNOSIS — M810 Age-related osteoporosis without current pathological fracture: Secondary | ICD-10-CM | POA: Diagnosis not present

## 2017-09-28 DIAGNOSIS — Z8781 Personal history of (healed) traumatic fracture: Secondary | ICD-10-CM | POA: Diagnosis not present

## 2017-09-28 DIAGNOSIS — M810 Age-related osteoporosis without current pathological fracture: Secondary | ICD-10-CM | POA: Diagnosis not present

## 2017-09-28 DIAGNOSIS — M159 Polyosteoarthritis, unspecified: Secondary | ICD-10-CM | POA: Diagnosis not present

## 2017-09-28 DIAGNOSIS — G40909 Epilepsy, unspecified, not intractable, without status epilepticus: Secondary | ICD-10-CM | POA: Diagnosis not present

## 2017-09-28 DIAGNOSIS — F0390 Unspecified dementia without behavioral disturbance: Secondary | ICD-10-CM | POA: Diagnosis not present

## 2017-09-28 DIAGNOSIS — R2681 Unsteadiness on feet: Secondary | ICD-10-CM | POA: Diagnosis not present

## 2017-09-29 DIAGNOSIS — M159 Polyosteoarthritis, unspecified: Secondary | ICD-10-CM | POA: Diagnosis not present

## 2017-09-29 DIAGNOSIS — R2681 Unsteadiness on feet: Secondary | ICD-10-CM | POA: Diagnosis not present

## 2017-09-29 DIAGNOSIS — G40909 Epilepsy, unspecified, not intractable, without status epilepticus: Secondary | ICD-10-CM | POA: Diagnosis not present

## 2017-09-29 DIAGNOSIS — F0390 Unspecified dementia without behavioral disturbance: Secondary | ICD-10-CM | POA: Diagnosis not present

## 2017-09-29 DIAGNOSIS — M810 Age-related osteoporosis without current pathological fracture: Secondary | ICD-10-CM | POA: Diagnosis not present

## 2017-09-29 DIAGNOSIS — Z8781 Personal history of (healed) traumatic fracture: Secondary | ICD-10-CM | POA: Diagnosis not present

## 2017-10-02 DIAGNOSIS — F0390 Unspecified dementia without behavioral disturbance: Secondary | ICD-10-CM | POA: Diagnosis not present

## 2017-10-02 DIAGNOSIS — M159 Polyosteoarthritis, unspecified: Secondary | ICD-10-CM | POA: Diagnosis not present

## 2017-10-02 DIAGNOSIS — R2681 Unsteadiness on feet: Secondary | ICD-10-CM | POA: Diagnosis not present

## 2017-10-02 DIAGNOSIS — Z8781 Personal history of (healed) traumatic fracture: Secondary | ICD-10-CM | POA: Diagnosis not present

## 2017-10-02 DIAGNOSIS — M810 Age-related osteoporosis without current pathological fracture: Secondary | ICD-10-CM | POA: Diagnosis not present

## 2017-10-02 DIAGNOSIS — G40909 Epilepsy, unspecified, not intractable, without status epilepticus: Secondary | ICD-10-CM | POA: Diagnosis not present

## 2017-10-03 DIAGNOSIS — I10 Essential (primary) hypertension: Secondary | ICD-10-CM | POA: Diagnosis not present

## 2017-10-03 DIAGNOSIS — F0390 Unspecified dementia without behavioral disturbance: Secondary | ICD-10-CM | POA: Diagnosis not present

## 2017-10-03 DIAGNOSIS — D509 Iron deficiency anemia, unspecified: Secondary | ICD-10-CM | POA: Diagnosis not present

## 2017-10-04 DIAGNOSIS — M810 Age-related osteoporosis without current pathological fracture: Secondary | ICD-10-CM | POA: Diagnosis not present

## 2017-10-04 DIAGNOSIS — F0390 Unspecified dementia without behavioral disturbance: Secondary | ICD-10-CM | POA: Diagnosis not present

## 2017-10-04 DIAGNOSIS — Z8781 Personal history of (healed) traumatic fracture: Secondary | ICD-10-CM | POA: Diagnosis not present

## 2017-10-04 DIAGNOSIS — M159 Polyosteoarthritis, unspecified: Secondary | ICD-10-CM | POA: Diagnosis not present

## 2017-10-04 DIAGNOSIS — G40909 Epilepsy, unspecified, not intractable, without status epilepticus: Secondary | ICD-10-CM | POA: Diagnosis not present

## 2017-10-04 DIAGNOSIS — R2681 Unsteadiness on feet: Secondary | ICD-10-CM | POA: Diagnosis not present

## 2017-10-07 DIAGNOSIS — M810 Age-related osteoporosis without current pathological fracture: Secondary | ICD-10-CM | POA: Diagnosis not present

## 2017-10-07 DIAGNOSIS — F0390 Unspecified dementia without behavioral disturbance: Secondary | ICD-10-CM | POA: Diagnosis not present

## 2017-10-07 DIAGNOSIS — M159 Polyosteoarthritis, unspecified: Secondary | ICD-10-CM | POA: Diagnosis not present

## 2017-10-07 DIAGNOSIS — G40909 Epilepsy, unspecified, not intractable, without status epilepticus: Secondary | ICD-10-CM | POA: Diagnosis not present

## 2017-10-07 DIAGNOSIS — R2681 Unsteadiness on feet: Secondary | ICD-10-CM | POA: Diagnosis not present

## 2017-10-07 DIAGNOSIS — Z8781 Personal history of (healed) traumatic fracture: Secondary | ICD-10-CM | POA: Diagnosis not present

## 2017-10-09 DIAGNOSIS — M159 Polyosteoarthritis, unspecified: Secondary | ICD-10-CM | POA: Diagnosis not present

## 2017-10-09 DIAGNOSIS — M810 Age-related osteoporosis without current pathological fracture: Secondary | ICD-10-CM | POA: Diagnosis not present

## 2017-10-09 DIAGNOSIS — F0390 Unspecified dementia without behavioral disturbance: Secondary | ICD-10-CM | POA: Diagnosis not present

## 2017-10-09 DIAGNOSIS — Z8781 Personal history of (healed) traumatic fracture: Secondary | ICD-10-CM | POA: Diagnosis not present

## 2017-10-09 DIAGNOSIS — G40909 Epilepsy, unspecified, not intractable, without status epilepticus: Secondary | ICD-10-CM | POA: Diagnosis not present

## 2017-10-09 DIAGNOSIS — R2681 Unsteadiness on feet: Secondary | ICD-10-CM | POA: Diagnosis not present

## 2017-10-11 DIAGNOSIS — G40909 Epilepsy, unspecified, not intractable, without status epilepticus: Secondary | ICD-10-CM | POA: Diagnosis not present

## 2017-10-11 DIAGNOSIS — R2681 Unsteadiness on feet: Secondary | ICD-10-CM | POA: Diagnosis not present

## 2017-10-11 DIAGNOSIS — Z8781 Personal history of (healed) traumatic fracture: Secondary | ICD-10-CM | POA: Diagnosis not present

## 2017-10-11 DIAGNOSIS — M159 Polyosteoarthritis, unspecified: Secondary | ICD-10-CM | POA: Diagnosis not present

## 2017-10-11 DIAGNOSIS — F0390 Unspecified dementia without behavioral disturbance: Secondary | ICD-10-CM | POA: Diagnosis not present

## 2017-10-11 DIAGNOSIS — M810 Age-related osteoporosis without current pathological fracture: Secondary | ICD-10-CM | POA: Diagnosis not present

## 2017-10-15 DIAGNOSIS — M159 Polyosteoarthritis, unspecified: Secondary | ICD-10-CM | POA: Diagnosis not present

## 2017-10-15 DIAGNOSIS — Z8781 Personal history of (healed) traumatic fracture: Secondary | ICD-10-CM | POA: Diagnosis not present

## 2017-10-15 DIAGNOSIS — G40909 Epilepsy, unspecified, not intractable, without status epilepticus: Secondary | ICD-10-CM | POA: Diagnosis not present

## 2017-10-15 DIAGNOSIS — R2681 Unsteadiness on feet: Secondary | ICD-10-CM | POA: Diagnosis not present

## 2017-10-15 DIAGNOSIS — F0390 Unspecified dementia without behavioral disturbance: Secondary | ICD-10-CM | POA: Diagnosis not present

## 2017-10-15 DIAGNOSIS — M810 Age-related osteoporosis without current pathological fracture: Secondary | ICD-10-CM | POA: Diagnosis not present

## 2017-10-18 DIAGNOSIS — R41841 Cognitive communication deficit: Secondary | ICD-10-CM | POA: Diagnosis not present

## 2017-10-18 DIAGNOSIS — F0390 Unspecified dementia without behavioral disturbance: Secondary | ICD-10-CM | POA: Diagnosis not present

## 2017-10-19 DIAGNOSIS — E039 Hypothyroidism, unspecified: Secondary | ICD-10-CM | POA: Diagnosis not present

## 2017-10-19 DIAGNOSIS — K219 Gastro-esophageal reflux disease without esophagitis: Secondary | ICD-10-CM | POA: Diagnosis not present

## 2017-10-19 DIAGNOSIS — G40909 Epilepsy, unspecified, not intractable, without status epilepticus: Secondary | ICD-10-CM | POA: Diagnosis not present

## 2017-10-19 DIAGNOSIS — R41841 Cognitive communication deficit: Secondary | ICD-10-CM | POA: Diagnosis not present

## 2017-10-19 DIAGNOSIS — I10 Essential (primary) hypertension: Secondary | ICD-10-CM | POA: Diagnosis not present

## 2017-10-19 DIAGNOSIS — F0390 Unspecified dementia without behavioral disturbance: Secondary | ICD-10-CM | POA: Diagnosis not present

## 2017-10-21 DIAGNOSIS — G478 Other sleep disorders: Secondary | ICD-10-CM | POA: Diagnosis not present

## 2017-10-21 DIAGNOSIS — F0281 Dementia in other diseases classified elsewhere with behavioral disturbance: Secondary | ICD-10-CM | POA: Diagnosis not present

## 2017-10-21 DIAGNOSIS — F331 Major depressive disorder, recurrent, moderate: Secondary | ICD-10-CM | POA: Diagnosis not present

## 2017-10-23 DIAGNOSIS — F0390 Unspecified dementia without behavioral disturbance: Secondary | ICD-10-CM | POA: Diagnosis not present

## 2017-10-23 DIAGNOSIS — R41841 Cognitive communication deficit: Secondary | ICD-10-CM | POA: Diagnosis not present

## 2017-10-24 DIAGNOSIS — F445 Conversion disorder with seizures or convulsions: Secondary | ICD-10-CM | POA: Diagnosis not present

## 2017-10-24 DIAGNOSIS — Z5181 Encounter for therapeutic drug level monitoring: Secondary | ICD-10-CM | POA: Diagnosis not present

## 2017-10-24 DIAGNOSIS — E119 Type 2 diabetes mellitus without complications: Secondary | ICD-10-CM | POA: Diagnosis not present

## 2017-10-24 DIAGNOSIS — E559 Vitamin D deficiency, unspecified: Secondary | ICD-10-CM | POA: Diagnosis not present

## 2017-10-24 DIAGNOSIS — R569 Unspecified convulsions: Secondary | ICD-10-CM | POA: Diagnosis not present

## 2017-10-24 DIAGNOSIS — Z79899 Other long term (current) drug therapy: Secondary | ICD-10-CM | POA: Diagnosis not present

## 2017-10-25 DIAGNOSIS — F0390 Unspecified dementia without behavioral disturbance: Secondary | ICD-10-CM | POA: Diagnosis not present

## 2017-10-25 DIAGNOSIS — R41841 Cognitive communication deficit: Secondary | ICD-10-CM | POA: Diagnosis not present

## 2017-10-26 DIAGNOSIS — G40909 Epilepsy, unspecified, not intractable, without status epilepticus: Secondary | ICD-10-CM | POA: Diagnosis not present

## 2017-10-26 DIAGNOSIS — K219 Gastro-esophageal reflux disease without esophagitis: Secondary | ICD-10-CM | POA: Diagnosis not present

## 2017-10-27 DIAGNOSIS — F0390 Unspecified dementia without behavioral disturbance: Secondary | ICD-10-CM | POA: Diagnosis not present

## 2017-10-27 DIAGNOSIS — R41841 Cognitive communication deficit: Secondary | ICD-10-CM | POA: Diagnosis not present

## 2017-10-30 DIAGNOSIS — F0390 Unspecified dementia without behavioral disturbance: Secondary | ICD-10-CM | POA: Diagnosis not present

## 2017-10-30 DIAGNOSIS — R41841 Cognitive communication deficit: Secondary | ICD-10-CM | POA: Diagnosis not present

## 2017-10-31 DIAGNOSIS — F0390 Unspecified dementia without behavioral disturbance: Secondary | ICD-10-CM | POA: Diagnosis not present

## 2017-10-31 DIAGNOSIS — R41841 Cognitive communication deficit: Secondary | ICD-10-CM | POA: Diagnosis not present

## 2017-11-01 DIAGNOSIS — F0390 Unspecified dementia without behavioral disturbance: Secondary | ICD-10-CM | POA: Diagnosis not present

## 2017-11-01 DIAGNOSIS — R41841 Cognitive communication deficit: Secondary | ICD-10-CM | POA: Diagnosis not present

## 2017-11-02 DIAGNOSIS — R41841 Cognitive communication deficit: Secondary | ICD-10-CM | POA: Diagnosis not present

## 2017-11-02 DIAGNOSIS — F0390 Unspecified dementia without behavioral disturbance: Secondary | ICD-10-CM | POA: Diagnosis not present

## 2017-11-06 DIAGNOSIS — G40909 Epilepsy, unspecified, not intractable, without status epilepticus: Secondary | ICD-10-CM | POA: Diagnosis not present

## 2017-11-07 DIAGNOSIS — R41841 Cognitive communication deficit: Secondary | ICD-10-CM | POA: Diagnosis not present

## 2017-11-07 DIAGNOSIS — F0390 Unspecified dementia without behavioral disturbance: Secondary | ICD-10-CM | POA: Diagnosis not present

## 2017-11-09 DIAGNOSIS — F0390 Unspecified dementia without behavioral disturbance: Secondary | ICD-10-CM | POA: Diagnosis not present

## 2017-11-09 DIAGNOSIS — R41841 Cognitive communication deficit: Secondary | ICD-10-CM | POA: Diagnosis not present

## 2017-11-10 DIAGNOSIS — R41841 Cognitive communication deficit: Secondary | ICD-10-CM | POA: Diagnosis not present

## 2017-11-10 DIAGNOSIS — F0390 Unspecified dementia without behavioral disturbance: Secondary | ICD-10-CM | POA: Diagnosis not present

## 2017-11-14 DIAGNOSIS — R41841 Cognitive communication deficit: Secondary | ICD-10-CM | POA: Diagnosis not present

## 2017-11-14 DIAGNOSIS — F0390 Unspecified dementia without behavioral disturbance: Secondary | ICD-10-CM | POA: Diagnosis not present

## 2017-11-16 DIAGNOSIS — R41841 Cognitive communication deficit: Secondary | ICD-10-CM | POA: Diagnosis not present

## 2017-11-16 DIAGNOSIS — F0390 Unspecified dementia without behavioral disturbance: Secondary | ICD-10-CM | POA: Diagnosis not present

## 2017-11-19 DIAGNOSIS — F0390 Unspecified dementia without behavioral disturbance: Secondary | ICD-10-CM | POA: Diagnosis not present

## 2017-11-19 DIAGNOSIS — R41841 Cognitive communication deficit: Secondary | ICD-10-CM | POA: Diagnosis not present

## 2017-11-20 DIAGNOSIS — I10 Essential (primary) hypertension: Secondary | ICD-10-CM | POA: Diagnosis not present

## 2017-11-20 DIAGNOSIS — G40909 Epilepsy, unspecified, not intractable, without status epilepticus: Secondary | ICD-10-CM | POA: Diagnosis not present

## 2017-11-20 DIAGNOSIS — K219 Gastro-esophageal reflux disease without esophagitis: Secondary | ICD-10-CM | POA: Diagnosis not present

## 2017-11-20 DIAGNOSIS — E039 Hypothyroidism, unspecified: Secondary | ICD-10-CM | POA: Diagnosis not present

## 2017-12-06 DIAGNOSIS — M79674 Pain in right toe(s): Secondary | ICD-10-CM | POA: Diagnosis not present

## 2017-12-06 DIAGNOSIS — B351 Tinea unguium: Secondary | ICD-10-CM | POA: Diagnosis not present

## 2018-01-10 DIAGNOSIS — E039 Hypothyroidism, unspecified: Secondary | ICD-10-CM | POA: Diagnosis not present

## 2018-01-10 DIAGNOSIS — G40909 Epilepsy, unspecified, not intractable, without status epilepticus: Secondary | ICD-10-CM | POA: Diagnosis not present

## 2018-01-10 DIAGNOSIS — F329 Major depressive disorder, single episode, unspecified: Secondary | ICD-10-CM | POA: Diagnosis not present

## 2018-01-27 DIAGNOSIS — F331 Major depressive disorder, recurrent, moderate: Secondary | ICD-10-CM | POA: Diagnosis not present

## 2018-01-27 DIAGNOSIS — G478 Other sleep disorders: Secondary | ICD-10-CM | POA: Diagnosis not present

## 2018-01-27 DIAGNOSIS — F0281 Dementia in other diseases classified elsewhere with behavioral disturbance: Secondary | ICD-10-CM | POA: Diagnosis not present

## 2018-02-05 DIAGNOSIS — R05 Cough: Secondary | ICD-10-CM | POA: Diagnosis not present

## 2018-02-06 DIAGNOSIS — Z5181 Encounter for therapeutic drug level monitoring: Secondary | ICD-10-CM | POA: Diagnosis not present

## 2018-02-06 DIAGNOSIS — E559 Vitamin D deficiency, unspecified: Secondary | ICD-10-CM | POA: Diagnosis not present

## 2018-02-06 DIAGNOSIS — D649 Anemia, unspecified: Secondary | ICD-10-CM | POA: Diagnosis not present

## 2018-02-06 DIAGNOSIS — G40909 Epilepsy, unspecified, not intractable, without status epilepticus: Secondary | ICD-10-CM | POA: Diagnosis not present

## 2018-02-06 DIAGNOSIS — R05 Cough: Secondary | ICD-10-CM | POA: Diagnosis not present

## 2018-02-06 DIAGNOSIS — D509 Iron deficiency anemia, unspecified: Secondary | ICD-10-CM | POA: Diagnosis not present

## 2018-02-12 DIAGNOSIS — F0390 Unspecified dementia without behavioral disturbance: Secondary | ICD-10-CM | POA: Diagnosis not present

## 2018-02-12 DIAGNOSIS — R05 Cough: Secondary | ICD-10-CM | POA: Diagnosis not present

## 2018-02-21 DIAGNOSIS — Z8781 Personal history of (healed) traumatic fracture: Secondary | ICD-10-CM | POA: Diagnosis not present

## 2018-02-21 DIAGNOSIS — R41841 Cognitive communication deficit: Secondary | ICD-10-CM | POA: Diagnosis not present

## 2018-02-21 DIAGNOSIS — F0390 Unspecified dementia without behavioral disturbance: Secondary | ICD-10-CM | POA: Diagnosis not present

## 2018-02-22 DIAGNOSIS — E039 Hypothyroidism, unspecified: Secondary | ICD-10-CM | POA: Diagnosis not present

## 2018-02-22 DIAGNOSIS — Z8781 Personal history of (healed) traumatic fracture: Secondary | ICD-10-CM | POA: Diagnosis not present

## 2018-02-22 DIAGNOSIS — D509 Iron deficiency anemia, unspecified: Secondary | ICD-10-CM | POA: Diagnosis not present

## 2018-02-22 DIAGNOSIS — I10 Essential (primary) hypertension: Secondary | ICD-10-CM | POA: Diagnosis not present

## 2018-02-22 DIAGNOSIS — R41841 Cognitive communication deficit: Secondary | ICD-10-CM | POA: Diagnosis not present

## 2018-02-22 DIAGNOSIS — R05 Cough: Secondary | ICD-10-CM | POA: Diagnosis not present

## 2018-02-22 DIAGNOSIS — F0390 Unspecified dementia without behavioral disturbance: Secondary | ICD-10-CM | POA: Diagnosis not present

## 2018-02-23 DIAGNOSIS — F0390 Unspecified dementia without behavioral disturbance: Secondary | ICD-10-CM | POA: Diagnosis not present

## 2018-02-23 DIAGNOSIS — R41841 Cognitive communication deficit: Secondary | ICD-10-CM | POA: Diagnosis not present

## 2018-02-23 DIAGNOSIS — Z8781 Personal history of (healed) traumatic fracture: Secondary | ICD-10-CM | POA: Diagnosis not present

## 2018-02-26 DIAGNOSIS — Z8781 Personal history of (healed) traumatic fracture: Secondary | ICD-10-CM | POA: Diagnosis not present

## 2018-02-26 DIAGNOSIS — R41841 Cognitive communication deficit: Secondary | ICD-10-CM | POA: Diagnosis not present

## 2018-02-26 DIAGNOSIS — F0390 Unspecified dementia without behavioral disturbance: Secondary | ICD-10-CM | POA: Diagnosis not present

## 2018-02-27 DIAGNOSIS — Z8781 Personal history of (healed) traumatic fracture: Secondary | ICD-10-CM | POA: Diagnosis not present

## 2018-02-27 DIAGNOSIS — F0390 Unspecified dementia without behavioral disturbance: Secondary | ICD-10-CM | POA: Diagnosis not present

## 2018-02-27 DIAGNOSIS — R41841 Cognitive communication deficit: Secondary | ICD-10-CM | POA: Diagnosis not present

## 2018-02-28 DIAGNOSIS — M79675 Pain in left toe(s): Secondary | ICD-10-CM | POA: Diagnosis not present

## 2018-02-28 DIAGNOSIS — M79674 Pain in right toe(s): Secondary | ICD-10-CM | POA: Diagnosis not present

## 2018-02-28 DIAGNOSIS — Z8781 Personal history of (healed) traumatic fracture: Secondary | ICD-10-CM | POA: Diagnosis not present

## 2018-02-28 DIAGNOSIS — R41841 Cognitive communication deficit: Secondary | ICD-10-CM | POA: Diagnosis not present

## 2018-02-28 DIAGNOSIS — B351 Tinea unguium: Secondary | ICD-10-CM | POA: Diagnosis not present

## 2018-02-28 DIAGNOSIS — F0390 Unspecified dementia without behavioral disturbance: Secondary | ICD-10-CM | POA: Diagnosis not present

## 2018-03-01 DIAGNOSIS — F0390 Unspecified dementia without behavioral disturbance: Secondary | ICD-10-CM | POA: Diagnosis not present

## 2018-03-01 DIAGNOSIS — R41841 Cognitive communication deficit: Secondary | ICD-10-CM | POA: Diagnosis not present

## 2018-03-01 DIAGNOSIS — Z8781 Personal history of (healed) traumatic fracture: Secondary | ICD-10-CM | POA: Diagnosis not present

## 2018-03-02 DIAGNOSIS — Z8781 Personal history of (healed) traumatic fracture: Secondary | ICD-10-CM | POA: Diagnosis not present

## 2018-03-02 DIAGNOSIS — F0390 Unspecified dementia without behavioral disturbance: Secondary | ICD-10-CM | POA: Diagnosis not present

## 2018-03-02 DIAGNOSIS — R41841 Cognitive communication deficit: Secondary | ICD-10-CM | POA: Diagnosis not present

## 2018-03-05 DIAGNOSIS — Z8781 Personal history of (healed) traumatic fracture: Secondary | ICD-10-CM | POA: Diagnosis not present

## 2018-03-05 DIAGNOSIS — R41841 Cognitive communication deficit: Secondary | ICD-10-CM | POA: Diagnosis not present

## 2018-03-05 DIAGNOSIS — F0390 Unspecified dementia without behavioral disturbance: Secondary | ICD-10-CM | POA: Diagnosis not present

## 2018-03-06 DIAGNOSIS — F0390 Unspecified dementia without behavioral disturbance: Secondary | ICD-10-CM | POA: Diagnosis not present

## 2018-03-06 DIAGNOSIS — Z8781 Personal history of (healed) traumatic fracture: Secondary | ICD-10-CM | POA: Diagnosis not present

## 2018-03-06 DIAGNOSIS — R41841 Cognitive communication deficit: Secondary | ICD-10-CM | POA: Diagnosis not present

## 2018-03-07 DIAGNOSIS — Z8781 Personal history of (healed) traumatic fracture: Secondary | ICD-10-CM | POA: Diagnosis not present

## 2018-03-07 DIAGNOSIS — F0390 Unspecified dementia without behavioral disturbance: Secondary | ICD-10-CM | POA: Diagnosis not present

## 2018-03-07 DIAGNOSIS — R41841 Cognitive communication deficit: Secondary | ICD-10-CM | POA: Diagnosis not present

## 2018-03-08 DIAGNOSIS — F0390 Unspecified dementia without behavioral disturbance: Secondary | ICD-10-CM | POA: Diagnosis not present

## 2018-03-08 DIAGNOSIS — Z8781 Personal history of (healed) traumatic fracture: Secondary | ICD-10-CM | POA: Diagnosis not present

## 2018-03-08 DIAGNOSIS — R41841 Cognitive communication deficit: Secondary | ICD-10-CM | POA: Diagnosis not present

## 2018-03-09 DIAGNOSIS — R41841 Cognitive communication deficit: Secondary | ICD-10-CM | POA: Diagnosis not present

## 2018-03-09 DIAGNOSIS — Z8781 Personal history of (healed) traumatic fracture: Secondary | ICD-10-CM | POA: Diagnosis not present

## 2018-03-09 DIAGNOSIS — F0390 Unspecified dementia without behavioral disturbance: Secondary | ICD-10-CM | POA: Diagnosis not present

## 2018-03-12 DIAGNOSIS — R41841 Cognitive communication deficit: Secondary | ICD-10-CM | POA: Diagnosis not present

## 2018-03-12 DIAGNOSIS — Z8781 Personal history of (healed) traumatic fracture: Secondary | ICD-10-CM | POA: Diagnosis not present

## 2018-03-12 DIAGNOSIS — F0390 Unspecified dementia without behavioral disturbance: Secondary | ICD-10-CM | POA: Diagnosis not present

## 2018-03-13 DIAGNOSIS — Z8781 Personal history of (healed) traumatic fracture: Secondary | ICD-10-CM | POA: Diagnosis not present

## 2018-03-13 DIAGNOSIS — F0390 Unspecified dementia without behavioral disturbance: Secondary | ICD-10-CM | POA: Diagnosis not present

## 2018-03-13 DIAGNOSIS — R41841 Cognitive communication deficit: Secondary | ICD-10-CM | POA: Diagnosis not present

## 2018-03-14 DIAGNOSIS — R41841 Cognitive communication deficit: Secondary | ICD-10-CM | POA: Diagnosis not present

## 2018-03-14 DIAGNOSIS — Z8781 Personal history of (healed) traumatic fracture: Secondary | ICD-10-CM | POA: Diagnosis not present

## 2018-03-14 DIAGNOSIS — F0390 Unspecified dementia without behavioral disturbance: Secondary | ICD-10-CM | POA: Diagnosis not present

## 2018-03-15 DIAGNOSIS — F0390 Unspecified dementia without behavioral disturbance: Secondary | ICD-10-CM | POA: Diagnosis not present

## 2018-03-15 DIAGNOSIS — Z8781 Personal history of (healed) traumatic fracture: Secondary | ICD-10-CM | POA: Diagnosis not present

## 2018-03-15 DIAGNOSIS — R41841 Cognitive communication deficit: Secondary | ICD-10-CM | POA: Diagnosis not present

## 2018-03-16 DIAGNOSIS — R41841 Cognitive communication deficit: Secondary | ICD-10-CM | POA: Diagnosis not present

## 2018-03-16 DIAGNOSIS — F0390 Unspecified dementia without behavioral disturbance: Secondary | ICD-10-CM | POA: Diagnosis not present

## 2018-03-16 DIAGNOSIS — Z8781 Personal history of (healed) traumatic fracture: Secondary | ICD-10-CM | POA: Diagnosis not present

## 2018-03-19 DIAGNOSIS — R41841 Cognitive communication deficit: Secondary | ICD-10-CM | POA: Diagnosis not present

## 2018-03-19 DIAGNOSIS — F0390 Unspecified dementia without behavioral disturbance: Secondary | ICD-10-CM | POA: Diagnosis not present

## 2018-03-19 DIAGNOSIS — Z9181 History of falling: Secondary | ICD-10-CM | POA: Diagnosis not present

## 2018-03-19 DIAGNOSIS — M6281 Muscle weakness (generalized): Secondary | ICD-10-CM | POA: Diagnosis not present

## 2018-03-19 DIAGNOSIS — Z8781 Personal history of (healed) traumatic fracture: Secondary | ICD-10-CM | POA: Diagnosis not present

## 2018-03-20 DIAGNOSIS — M6281 Muscle weakness (generalized): Secondary | ICD-10-CM | POA: Diagnosis not present

## 2018-03-20 DIAGNOSIS — Z8781 Personal history of (healed) traumatic fracture: Secondary | ICD-10-CM | POA: Diagnosis not present

## 2018-03-20 DIAGNOSIS — R41841 Cognitive communication deficit: Secondary | ICD-10-CM | POA: Diagnosis not present

## 2018-03-20 DIAGNOSIS — F0390 Unspecified dementia without behavioral disturbance: Secondary | ICD-10-CM | POA: Diagnosis not present

## 2018-03-20 DIAGNOSIS — Z9181 History of falling: Secondary | ICD-10-CM | POA: Diagnosis not present

## 2018-03-21 DIAGNOSIS — Z8781 Personal history of (healed) traumatic fracture: Secondary | ICD-10-CM | POA: Diagnosis not present

## 2018-03-21 DIAGNOSIS — M6281 Muscle weakness (generalized): Secondary | ICD-10-CM | POA: Diagnosis not present

## 2018-03-21 DIAGNOSIS — Z9181 History of falling: Secondary | ICD-10-CM | POA: Diagnosis not present

## 2018-03-21 DIAGNOSIS — R41841 Cognitive communication deficit: Secondary | ICD-10-CM | POA: Diagnosis not present

## 2018-03-21 DIAGNOSIS — F0390 Unspecified dementia without behavioral disturbance: Secondary | ICD-10-CM | POA: Diagnosis not present

## 2018-03-30 DIAGNOSIS — G3184 Mild cognitive impairment, so stated: Secondary | ICD-10-CM | POA: Diagnosis not present

## 2018-03-30 DIAGNOSIS — F321 Major depressive disorder, single episode, moderate: Secondary | ICD-10-CM | POA: Diagnosis not present

## 2018-03-30 DIAGNOSIS — F5105 Insomnia due to other mental disorder: Secondary | ICD-10-CM | POA: Diagnosis not present

## 2018-04-04 DIAGNOSIS — D509 Iron deficiency anemia, unspecified: Secondary | ICD-10-CM | POA: Diagnosis not present

## 2018-04-04 DIAGNOSIS — I10 Essential (primary) hypertension: Secondary | ICD-10-CM | POA: Diagnosis not present

## 2018-04-04 DIAGNOSIS — F0390 Unspecified dementia without behavioral disturbance: Secondary | ICD-10-CM | POA: Diagnosis not present

## 2018-04-11 DIAGNOSIS — F0390 Unspecified dementia without behavioral disturbance: Secondary | ICD-10-CM | POA: Diagnosis not present

## 2018-04-11 DIAGNOSIS — Z9181 History of falling: Secondary | ICD-10-CM | POA: Diagnosis not present

## 2018-04-11 DIAGNOSIS — M6281 Muscle weakness (generalized): Secondary | ICD-10-CM | POA: Diagnosis not present

## 2018-04-11 DIAGNOSIS — R41841 Cognitive communication deficit: Secondary | ICD-10-CM | POA: Diagnosis not present

## 2018-04-11 DIAGNOSIS — Z8781 Personal history of (healed) traumatic fracture: Secondary | ICD-10-CM | POA: Diagnosis not present

## 2018-04-12 DIAGNOSIS — F0390 Unspecified dementia without behavioral disturbance: Secondary | ICD-10-CM | POA: Diagnosis not present

## 2018-04-12 DIAGNOSIS — M6281 Muscle weakness (generalized): Secondary | ICD-10-CM | POA: Diagnosis not present

## 2018-04-12 DIAGNOSIS — Z8781 Personal history of (healed) traumatic fracture: Secondary | ICD-10-CM | POA: Diagnosis not present

## 2018-04-12 DIAGNOSIS — R41841 Cognitive communication deficit: Secondary | ICD-10-CM | POA: Diagnosis not present

## 2018-04-12 DIAGNOSIS — Z9181 History of falling: Secondary | ICD-10-CM | POA: Diagnosis not present

## 2018-04-13 DIAGNOSIS — M6281 Muscle weakness (generalized): Secondary | ICD-10-CM | POA: Diagnosis not present

## 2018-04-13 DIAGNOSIS — R41841 Cognitive communication deficit: Secondary | ICD-10-CM | POA: Diagnosis not present

## 2018-04-13 DIAGNOSIS — Z8781 Personal history of (healed) traumatic fracture: Secondary | ICD-10-CM | POA: Diagnosis not present

## 2018-04-13 DIAGNOSIS — F0390 Unspecified dementia without behavioral disturbance: Secondary | ICD-10-CM | POA: Diagnosis not present

## 2018-04-13 DIAGNOSIS — Z9181 History of falling: Secondary | ICD-10-CM | POA: Diagnosis not present

## 2018-04-16 DIAGNOSIS — F0390 Unspecified dementia without behavioral disturbance: Secondary | ICD-10-CM | POA: Diagnosis not present

## 2018-04-16 DIAGNOSIS — M6281 Muscle weakness (generalized): Secondary | ICD-10-CM | POA: Diagnosis not present

## 2018-04-16 DIAGNOSIS — Z8781 Personal history of (healed) traumatic fracture: Secondary | ICD-10-CM | POA: Diagnosis not present

## 2018-04-16 DIAGNOSIS — Z9181 History of falling: Secondary | ICD-10-CM | POA: Diagnosis not present

## 2018-04-16 DIAGNOSIS — R41841 Cognitive communication deficit: Secondary | ICD-10-CM | POA: Diagnosis not present

## 2018-04-17 DIAGNOSIS — F0390 Unspecified dementia without behavioral disturbance: Secondary | ICD-10-CM | POA: Diagnosis not present

## 2018-04-17 DIAGNOSIS — M6281 Muscle weakness (generalized): Secondary | ICD-10-CM | POA: Diagnosis not present

## 2018-04-17 DIAGNOSIS — R41841 Cognitive communication deficit: Secondary | ICD-10-CM | POA: Diagnosis not present

## 2018-04-17 DIAGNOSIS — Z9181 History of falling: Secondary | ICD-10-CM | POA: Diagnosis not present

## 2018-04-17 DIAGNOSIS — Z8781 Personal history of (healed) traumatic fracture: Secondary | ICD-10-CM | POA: Diagnosis not present

## 2018-04-18 DIAGNOSIS — F0390 Unspecified dementia without behavioral disturbance: Secondary | ICD-10-CM | POA: Diagnosis not present

## 2018-04-18 DIAGNOSIS — M6281 Muscle weakness (generalized): Secondary | ICD-10-CM | POA: Diagnosis not present

## 2018-04-18 DIAGNOSIS — Z9181 History of falling: Secondary | ICD-10-CM | POA: Diagnosis not present

## 2018-04-19 DIAGNOSIS — F0390 Unspecified dementia without behavioral disturbance: Secondary | ICD-10-CM | POA: Diagnosis not present

## 2018-04-19 DIAGNOSIS — M6281 Muscle weakness (generalized): Secondary | ICD-10-CM | POA: Diagnosis not present

## 2018-04-19 DIAGNOSIS — Z9181 History of falling: Secondary | ICD-10-CM | POA: Diagnosis not present

## 2018-04-20 DIAGNOSIS — F0390 Unspecified dementia without behavioral disturbance: Secondary | ICD-10-CM | POA: Diagnosis not present

## 2018-04-20 DIAGNOSIS — Z9181 History of falling: Secondary | ICD-10-CM | POA: Diagnosis not present

## 2018-04-20 DIAGNOSIS — M6281 Muscle weakness (generalized): Secondary | ICD-10-CM | POA: Diagnosis not present

## 2018-04-23 DIAGNOSIS — M6281 Muscle weakness (generalized): Secondary | ICD-10-CM | POA: Diagnosis not present

## 2018-04-23 DIAGNOSIS — F0390 Unspecified dementia without behavioral disturbance: Secondary | ICD-10-CM | POA: Diagnosis not present

## 2018-04-23 DIAGNOSIS — Z9181 History of falling: Secondary | ICD-10-CM | POA: Diagnosis not present

## 2018-04-24 DIAGNOSIS — Z9181 History of falling: Secondary | ICD-10-CM | POA: Diagnosis not present

## 2018-04-24 DIAGNOSIS — M6281 Muscle weakness (generalized): Secondary | ICD-10-CM | POA: Diagnosis not present

## 2018-04-24 DIAGNOSIS — F0390 Unspecified dementia without behavioral disturbance: Secondary | ICD-10-CM | POA: Diagnosis not present

## 2018-04-25 DIAGNOSIS — M6281 Muscle weakness (generalized): Secondary | ICD-10-CM | POA: Diagnosis not present

## 2018-04-25 DIAGNOSIS — Z9181 History of falling: Secondary | ICD-10-CM | POA: Diagnosis not present

## 2018-04-25 DIAGNOSIS — F0390 Unspecified dementia without behavioral disturbance: Secondary | ICD-10-CM | POA: Diagnosis not present

## 2018-04-26 DIAGNOSIS — M6281 Muscle weakness (generalized): Secondary | ICD-10-CM | POA: Diagnosis not present

## 2018-04-26 DIAGNOSIS — Z9181 History of falling: Secondary | ICD-10-CM | POA: Diagnosis not present

## 2018-04-26 DIAGNOSIS — F0390 Unspecified dementia without behavioral disturbance: Secondary | ICD-10-CM | POA: Diagnosis not present

## 2018-04-27 DIAGNOSIS — Z9181 History of falling: Secondary | ICD-10-CM | POA: Diagnosis not present

## 2018-04-27 DIAGNOSIS — F0390 Unspecified dementia without behavioral disturbance: Secondary | ICD-10-CM | POA: Diagnosis not present

## 2018-04-27 DIAGNOSIS — M6281 Muscle weakness (generalized): Secondary | ICD-10-CM | POA: Diagnosis not present

## 2018-04-30 DIAGNOSIS — F0390 Unspecified dementia without behavioral disturbance: Secondary | ICD-10-CM | POA: Diagnosis not present

## 2018-04-30 DIAGNOSIS — M6281 Muscle weakness (generalized): Secondary | ICD-10-CM | POA: Diagnosis not present

## 2018-04-30 DIAGNOSIS — Z9181 History of falling: Secondary | ICD-10-CM | POA: Diagnosis not present

## 2018-05-01 DIAGNOSIS — M6281 Muscle weakness (generalized): Secondary | ICD-10-CM | POA: Diagnosis not present

## 2018-05-01 DIAGNOSIS — F0390 Unspecified dementia without behavioral disturbance: Secondary | ICD-10-CM | POA: Diagnosis not present

## 2018-05-01 DIAGNOSIS — Z9181 History of falling: Secondary | ICD-10-CM | POA: Diagnosis not present

## 2018-05-02 DIAGNOSIS — Z9181 History of falling: Secondary | ICD-10-CM | POA: Diagnosis not present

## 2018-05-02 DIAGNOSIS — F0390 Unspecified dementia without behavioral disturbance: Secondary | ICD-10-CM | POA: Diagnosis not present

## 2018-05-02 DIAGNOSIS — M6281 Muscle weakness (generalized): Secondary | ICD-10-CM | POA: Diagnosis not present

## 2018-05-03 DIAGNOSIS — Z9181 History of falling: Secondary | ICD-10-CM | POA: Diagnosis not present

## 2018-05-03 DIAGNOSIS — F0390 Unspecified dementia without behavioral disturbance: Secondary | ICD-10-CM | POA: Diagnosis not present

## 2018-05-03 DIAGNOSIS — M6281 Muscle weakness (generalized): Secondary | ICD-10-CM | POA: Diagnosis not present

## 2018-05-04 DIAGNOSIS — M6281 Muscle weakness (generalized): Secondary | ICD-10-CM | POA: Diagnosis not present

## 2018-05-04 DIAGNOSIS — Z9181 History of falling: Secondary | ICD-10-CM | POA: Diagnosis not present

## 2018-05-04 DIAGNOSIS — F0390 Unspecified dementia without behavioral disturbance: Secondary | ICD-10-CM | POA: Diagnosis not present

## 2018-05-07 DIAGNOSIS — Z9181 History of falling: Secondary | ICD-10-CM | POA: Diagnosis not present

## 2018-05-07 DIAGNOSIS — M6281 Muscle weakness (generalized): Secondary | ICD-10-CM | POA: Diagnosis not present

## 2018-05-07 DIAGNOSIS — F0390 Unspecified dementia without behavioral disturbance: Secondary | ICD-10-CM | POA: Diagnosis not present

## 2018-05-08 DIAGNOSIS — Z9181 History of falling: Secondary | ICD-10-CM | POA: Diagnosis not present

## 2018-05-08 DIAGNOSIS — F0390 Unspecified dementia without behavioral disturbance: Secondary | ICD-10-CM | POA: Diagnosis not present

## 2018-05-08 DIAGNOSIS — M6281 Muscle weakness (generalized): Secondary | ICD-10-CM | POA: Diagnosis not present

## 2018-05-09 DIAGNOSIS — F0390 Unspecified dementia without behavioral disturbance: Secondary | ICD-10-CM | POA: Diagnosis not present

## 2018-05-09 DIAGNOSIS — M6281 Muscle weakness (generalized): Secondary | ICD-10-CM | POA: Diagnosis not present

## 2018-05-09 DIAGNOSIS — Z9181 History of falling: Secondary | ICD-10-CM | POA: Diagnosis not present

## 2018-05-10 DIAGNOSIS — F0390 Unspecified dementia without behavioral disturbance: Secondary | ICD-10-CM | POA: Diagnosis not present

## 2018-05-10 DIAGNOSIS — Z9181 History of falling: Secondary | ICD-10-CM | POA: Diagnosis not present

## 2018-05-10 DIAGNOSIS — M6281 Muscle weakness (generalized): Secondary | ICD-10-CM | POA: Diagnosis not present

## 2018-05-11 DIAGNOSIS — F0390 Unspecified dementia without behavioral disturbance: Secondary | ICD-10-CM | POA: Diagnosis not present

## 2018-05-11 DIAGNOSIS — G301 Alzheimer's disease with late onset: Secondary | ICD-10-CM | POA: Diagnosis not present

## 2018-05-11 DIAGNOSIS — Z9181 History of falling: Secondary | ICD-10-CM | POA: Diagnosis not present

## 2018-05-11 DIAGNOSIS — M6281 Muscle weakness (generalized): Secondary | ICD-10-CM | POA: Diagnosis not present

## 2018-05-11 DIAGNOSIS — F321 Major depressive disorder, single episode, moderate: Secondary | ICD-10-CM | POA: Diagnosis not present

## 2018-05-11 DIAGNOSIS — F5105 Insomnia due to other mental disorder: Secondary | ICD-10-CM | POA: Diagnosis not present

## 2018-05-14 DIAGNOSIS — F0390 Unspecified dementia without behavioral disturbance: Secondary | ICD-10-CM | POA: Diagnosis not present

## 2018-05-14 DIAGNOSIS — M6281 Muscle weakness (generalized): Secondary | ICD-10-CM | POA: Diagnosis not present

## 2018-05-14 DIAGNOSIS — Z9181 History of falling: Secondary | ICD-10-CM | POA: Diagnosis not present

## 2018-05-15 DIAGNOSIS — F0390 Unspecified dementia without behavioral disturbance: Secondary | ICD-10-CM | POA: Diagnosis not present

## 2018-05-15 DIAGNOSIS — M6281 Muscle weakness (generalized): Secondary | ICD-10-CM | POA: Diagnosis not present

## 2018-05-15 DIAGNOSIS — Z9181 History of falling: Secondary | ICD-10-CM | POA: Diagnosis not present

## 2018-05-16 DIAGNOSIS — Z9181 History of falling: Secondary | ICD-10-CM | POA: Diagnosis not present

## 2018-05-16 DIAGNOSIS — M6281 Muscle weakness (generalized): Secondary | ICD-10-CM | POA: Diagnosis not present

## 2018-05-16 DIAGNOSIS — F0390 Unspecified dementia without behavioral disturbance: Secondary | ICD-10-CM | POA: Diagnosis not present

## 2018-05-17 DIAGNOSIS — Z9181 History of falling: Secondary | ICD-10-CM | POA: Diagnosis not present

## 2018-05-17 DIAGNOSIS — F0390 Unspecified dementia without behavioral disturbance: Secondary | ICD-10-CM | POA: Diagnosis not present

## 2018-05-17 DIAGNOSIS — M6281 Muscle weakness (generalized): Secondary | ICD-10-CM | POA: Diagnosis not present

## 2018-05-19 DIAGNOSIS — Z9181 History of falling: Secondary | ICD-10-CM | POA: Diagnosis not present

## 2018-05-19 DIAGNOSIS — F0390 Unspecified dementia without behavioral disturbance: Secondary | ICD-10-CM | POA: Diagnosis not present

## 2018-05-19 DIAGNOSIS — M6281 Muscle weakness (generalized): Secondary | ICD-10-CM | POA: Diagnosis not present

## 2018-05-21 DIAGNOSIS — G40909 Epilepsy, unspecified, not intractable, without status epilepticus: Secondary | ICD-10-CM | POA: Diagnosis not present

## 2018-05-21 DIAGNOSIS — Z9181 History of falling: Secondary | ICD-10-CM | POA: Diagnosis not present

## 2018-05-21 DIAGNOSIS — F0390 Unspecified dementia without behavioral disturbance: Secondary | ICD-10-CM | POA: Diagnosis not present

## 2018-05-21 DIAGNOSIS — M6281 Muscle weakness (generalized): Secondary | ICD-10-CM | POA: Diagnosis not present

## 2018-05-22 DIAGNOSIS — M6281 Muscle weakness (generalized): Secondary | ICD-10-CM | POA: Diagnosis not present

## 2018-05-22 DIAGNOSIS — F0390 Unspecified dementia without behavioral disturbance: Secondary | ICD-10-CM | POA: Diagnosis not present

## 2018-05-22 DIAGNOSIS — Z9181 History of falling: Secondary | ICD-10-CM | POA: Diagnosis not present

## 2018-05-23 DIAGNOSIS — R569 Unspecified convulsions: Secondary | ICD-10-CM | POA: Diagnosis not present

## 2018-05-23 DIAGNOSIS — Z9181 History of falling: Secondary | ICD-10-CM | POA: Diagnosis not present

## 2018-05-23 DIAGNOSIS — E039 Hypothyroidism, unspecified: Secondary | ICD-10-CM | POA: Diagnosis not present

## 2018-05-23 DIAGNOSIS — M6281 Muscle weakness (generalized): Secondary | ICD-10-CM | POA: Diagnosis not present

## 2018-05-23 DIAGNOSIS — I10 Essential (primary) hypertension: Secondary | ICD-10-CM | POA: Diagnosis not present

## 2018-05-23 DIAGNOSIS — Z79899 Other long term (current) drug therapy: Secondary | ICD-10-CM | POA: Diagnosis not present

## 2018-05-23 DIAGNOSIS — F0390 Unspecified dementia without behavioral disturbance: Secondary | ICD-10-CM | POA: Diagnosis not present

## 2018-05-23 DIAGNOSIS — Z5181 Encounter for therapeutic drug level monitoring: Secondary | ICD-10-CM | POA: Diagnosis not present

## 2018-05-23 DIAGNOSIS — E785 Hyperlipidemia, unspecified: Secondary | ICD-10-CM | POA: Diagnosis not present

## 2018-05-23 DIAGNOSIS — D649 Anemia, unspecified: Secondary | ICD-10-CM | POA: Diagnosis not present

## 2018-05-24 DIAGNOSIS — E039 Hypothyroidism, unspecified: Secondary | ICD-10-CM | POA: Diagnosis not present

## 2018-05-24 DIAGNOSIS — F0390 Unspecified dementia without behavioral disturbance: Secondary | ICD-10-CM | POA: Diagnosis not present

## 2018-05-24 DIAGNOSIS — D509 Iron deficiency anemia, unspecified: Secondary | ICD-10-CM | POA: Diagnosis not present

## 2018-05-24 DIAGNOSIS — K219 Gastro-esophageal reflux disease without esophagitis: Secondary | ICD-10-CM | POA: Diagnosis not present

## 2018-05-24 DIAGNOSIS — M6281 Muscle weakness (generalized): Secondary | ICD-10-CM | POA: Diagnosis not present

## 2018-05-24 DIAGNOSIS — E781 Pure hyperglyceridemia: Secondary | ICD-10-CM | POA: Diagnosis not present

## 2018-05-24 DIAGNOSIS — Z9181 History of falling: Secondary | ICD-10-CM | POA: Diagnosis not present

## 2018-05-25 DIAGNOSIS — Z9181 History of falling: Secondary | ICD-10-CM | POA: Diagnosis not present

## 2018-05-25 DIAGNOSIS — M6281 Muscle weakness (generalized): Secondary | ICD-10-CM | POA: Diagnosis not present

## 2018-05-25 DIAGNOSIS — F0390 Unspecified dementia without behavioral disturbance: Secondary | ICD-10-CM | POA: Diagnosis not present

## 2018-05-31 DIAGNOSIS — R799 Abnormal finding of blood chemistry, unspecified: Secondary | ICD-10-CM | POA: Diagnosis not present

## 2018-05-31 DIAGNOSIS — G40909 Epilepsy, unspecified, not intractable, without status epilepticus: Secondary | ICD-10-CM | POA: Diagnosis not present

## 2018-06-04 DIAGNOSIS — D649 Anemia, unspecified: Secondary | ICD-10-CM | POA: Diagnosis not present

## 2018-06-04 DIAGNOSIS — M6281 Muscle weakness (generalized): Secondary | ICD-10-CM | POA: Diagnosis not present

## 2018-06-08 DIAGNOSIS — G301 Alzheimer's disease with late onset: Secondary | ICD-10-CM | POA: Diagnosis not present

## 2018-06-08 DIAGNOSIS — F5105 Insomnia due to other mental disorder: Secondary | ICD-10-CM | POA: Diagnosis not present

## 2018-06-08 DIAGNOSIS — F321 Major depressive disorder, single episode, moderate: Secondary | ICD-10-CM | POA: Diagnosis not present

## 2018-06-14 DIAGNOSIS — I10 Essential (primary) hypertension: Secondary | ICD-10-CM | POA: Diagnosis not present

## 2018-06-14 DIAGNOSIS — R799 Abnormal finding of blood chemistry, unspecified: Secondary | ICD-10-CM | POA: Diagnosis not present

## 2018-06-14 DIAGNOSIS — F0390 Unspecified dementia without behavioral disturbance: Secondary | ICD-10-CM | POA: Diagnosis not present

## 2018-06-14 DIAGNOSIS — F329 Major depressive disorder, single episode, unspecified: Secondary | ICD-10-CM | POA: Diagnosis not present

## 2018-06-25 DIAGNOSIS — D649 Anemia, unspecified: Secondary | ICD-10-CM | POA: Diagnosis not present

## 2018-06-25 DIAGNOSIS — N184 Chronic kidney disease, stage 4 (severe): Secondary | ICD-10-CM | POA: Diagnosis not present

## 2018-06-25 DIAGNOSIS — R799 Abnormal finding of blood chemistry, unspecified: Secondary | ICD-10-CM | POA: Diagnosis not present

## 2018-06-26 DIAGNOSIS — M6281 Muscle weakness (generalized): Secondary | ICD-10-CM | POA: Diagnosis not present

## 2018-06-26 DIAGNOSIS — M159 Polyosteoarthritis, unspecified: Secondary | ICD-10-CM | POA: Diagnosis not present

## 2018-06-27 DIAGNOSIS — M159 Polyosteoarthritis, unspecified: Secondary | ICD-10-CM | POA: Diagnosis not present

## 2018-06-27 DIAGNOSIS — M1711 Unilateral primary osteoarthritis, right knee: Secondary | ICD-10-CM | POA: Diagnosis not present

## 2018-06-27 DIAGNOSIS — M6281 Muscle weakness (generalized): Secondary | ICD-10-CM | POA: Diagnosis not present

## 2018-06-28 DIAGNOSIS — G40909 Epilepsy, unspecified, not intractable, without status epilepticus: Secondary | ICD-10-CM | POA: Diagnosis not present

## 2018-06-28 DIAGNOSIS — M6281 Muscle weakness (generalized): Secondary | ICD-10-CM | POA: Diagnosis not present

## 2018-06-28 DIAGNOSIS — F0390 Unspecified dementia without behavioral disturbance: Secondary | ICD-10-CM | POA: Diagnosis not present

## 2018-06-28 DIAGNOSIS — M159 Polyosteoarthritis, unspecified: Secondary | ICD-10-CM | POA: Diagnosis not present

## 2018-06-28 DIAGNOSIS — W19XXXA Unspecified fall, initial encounter: Secondary | ICD-10-CM | POA: Diagnosis not present

## 2018-06-29 DIAGNOSIS — M6281 Muscle weakness (generalized): Secondary | ICD-10-CM | POA: Diagnosis not present

## 2018-06-29 DIAGNOSIS — M159 Polyosteoarthritis, unspecified: Secondary | ICD-10-CM | POA: Diagnosis not present

## 2018-07-01 DIAGNOSIS — M1711 Unilateral primary osteoarthritis, right knee: Secondary | ICD-10-CM | POA: Diagnosis not present

## 2018-07-02 DIAGNOSIS — M159 Polyosteoarthritis, unspecified: Secondary | ICD-10-CM | POA: Diagnosis not present

## 2018-07-02 DIAGNOSIS — M6281 Muscle weakness (generalized): Secondary | ICD-10-CM | POA: Diagnosis not present

## 2018-07-03 DIAGNOSIS — M159 Polyosteoarthritis, unspecified: Secondary | ICD-10-CM | POA: Diagnosis not present

## 2018-07-03 DIAGNOSIS — M6281 Muscle weakness (generalized): Secondary | ICD-10-CM | POA: Diagnosis not present

## 2018-07-04 DIAGNOSIS — M6281 Muscle weakness (generalized): Secondary | ICD-10-CM | POA: Diagnosis not present

## 2018-07-04 DIAGNOSIS — M159 Polyosteoarthritis, unspecified: Secondary | ICD-10-CM | POA: Diagnosis not present

## 2018-07-05 DIAGNOSIS — M159 Polyosteoarthritis, unspecified: Secondary | ICD-10-CM | POA: Diagnosis not present

## 2018-07-05 DIAGNOSIS — M6281 Muscle weakness (generalized): Secondary | ICD-10-CM | POA: Diagnosis not present

## 2018-07-06 DIAGNOSIS — M159 Polyosteoarthritis, unspecified: Secondary | ICD-10-CM | POA: Diagnosis not present

## 2018-07-06 DIAGNOSIS — M6281 Muscle weakness (generalized): Secondary | ICD-10-CM | POA: Diagnosis not present

## 2018-07-09 DIAGNOSIS — Z5181 Encounter for therapeutic drug level monitoring: Secondary | ICD-10-CM | POA: Diagnosis not present

## 2018-07-09 DIAGNOSIS — M159 Polyosteoarthritis, unspecified: Secondary | ICD-10-CM | POA: Diagnosis not present

## 2018-07-09 DIAGNOSIS — E039 Hypothyroidism, unspecified: Secondary | ICD-10-CM | POA: Diagnosis not present

## 2018-07-09 DIAGNOSIS — E785 Hyperlipidemia, unspecified: Secondary | ICD-10-CM | POA: Diagnosis not present

## 2018-07-09 DIAGNOSIS — N184 Chronic kidney disease, stage 4 (severe): Secondary | ICD-10-CM | POA: Diagnosis not present

## 2018-07-09 DIAGNOSIS — Z79899 Other long term (current) drug therapy: Secondary | ICD-10-CM | POA: Diagnosis not present

## 2018-07-09 DIAGNOSIS — M6281 Muscle weakness (generalized): Secondary | ICD-10-CM | POA: Diagnosis not present

## 2018-07-09 DIAGNOSIS — W19XXXA Unspecified fall, initial encounter: Secondary | ICD-10-CM | POA: Diagnosis not present

## 2018-07-09 DIAGNOSIS — E781 Pure hyperglyceridemia: Secondary | ICD-10-CM | POA: Diagnosis not present

## 2018-07-09 DIAGNOSIS — R799 Abnormal finding of blood chemistry, unspecified: Secondary | ICD-10-CM | POA: Diagnosis not present

## 2018-07-10 DIAGNOSIS — M6281 Muscle weakness (generalized): Secondary | ICD-10-CM | POA: Diagnosis not present

## 2018-07-10 DIAGNOSIS — M159 Polyosteoarthritis, unspecified: Secondary | ICD-10-CM | POA: Diagnosis not present

## 2018-07-11 DIAGNOSIS — M6281 Muscle weakness (generalized): Secondary | ICD-10-CM | POA: Diagnosis not present

## 2018-07-11 DIAGNOSIS — M159 Polyosteoarthritis, unspecified: Secondary | ICD-10-CM | POA: Diagnosis not present

## 2018-07-12 DIAGNOSIS — M6281 Muscle weakness (generalized): Secondary | ICD-10-CM | POA: Diagnosis not present

## 2018-07-12 DIAGNOSIS — M159 Polyosteoarthritis, unspecified: Secondary | ICD-10-CM | POA: Diagnosis not present

## 2018-07-13 DIAGNOSIS — F5101 Primary insomnia: Secondary | ICD-10-CM | POA: Diagnosis not present

## 2018-07-13 DIAGNOSIS — M159 Polyosteoarthritis, unspecified: Secondary | ICD-10-CM | POA: Diagnosis not present

## 2018-07-13 DIAGNOSIS — G301 Alzheimer's disease with late onset: Secondary | ICD-10-CM | POA: Diagnosis not present

## 2018-07-13 DIAGNOSIS — F321 Major depressive disorder, single episode, moderate: Secondary | ICD-10-CM | POA: Diagnosis not present

## 2018-07-13 DIAGNOSIS — M6281 Muscle weakness (generalized): Secondary | ICD-10-CM | POA: Diagnosis not present

## 2018-07-16 DIAGNOSIS — M6281 Muscle weakness (generalized): Secondary | ICD-10-CM | POA: Diagnosis not present

## 2018-07-16 DIAGNOSIS — M159 Polyosteoarthritis, unspecified: Secondary | ICD-10-CM | POA: Diagnosis not present

## 2018-07-17 DIAGNOSIS — M159 Polyosteoarthritis, unspecified: Secondary | ICD-10-CM | POA: Diagnosis not present

## 2018-07-17 DIAGNOSIS — M6281 Muscle weakness (generalized): Secondary | ICD-10-CM | POA: Diagnosis not present

## 2018-07-18 DIAGNOSIS — M159 Polyosteoarthritis, unspecified: Secondary | ICD-10-CM | POA: Diagnosis not present

## 2018-07-18 DIAGNOSIS — M6281 Muscle weakness (generalized): Secondary | ICD-10-CM | POA: Diagnosis not present

## 2018-07-19 DIAGNOSIS — M159 Polyosteoarthritis, unspecified: Secondary | ICD-10-CM | POA: Diagnosis not present

## 2018-07-19 DIAGNOSIS — M6281 Muscle weakness (generalized): Secondary | ICD-10-CM | POA: Diagnosis not present

## 2018-07-20 DIAGNOSIS — M159 Polyosteoarthritis, unspecified: Secondary | ICD-10-CM | POA: Diagnosis not present

## 2018-07-20 DIAGNOSIS — M6281 Muscle weakness (generalized): Secondary | ICD-10-CM | POA: Diagnosis not present

## 2018-07-22 DIAGNOSIS — M159 Polyosteoarthritis, unspecified: Secondary | ICD-10-CM | POA: Diagnosis not present

## 2018-07-22 DIAGNOSIS — M6281 Muscle weakness (generalized): Secondary | ICD-10-CM | POA: Diagnosis not present

## 2018-07-23 DIAGNOSIS — D509 Iron deficiency anemia, unspecified: Secondary | ICD-10-CM | POA: Diagnosis not present

## 2018-07-23 DIAGNOSIS — N184 Chronic kidney disease, stage 4 (severe): Secondary | ICD-10-CM | POA: Diagnosis not present

## 2018-07-23 DIAGNOSIS — R799 Abnormal finding of blood chemistry, unspecified: Secondary | ICD-10-CM | POA: Diagnosis not present

## 2018-07-23 DIAGNOSIS — D649 Anemia, unspecified: Secondary | ICD-10-CM | POA: Diagnosis not present

## 2018-07-24 DIAGNOSIS — M159 Polyosteoarthritis, unspecified: Secondary | ICD-10-CM | POA: Diagnosis not present

## 2018-07-24 DIAGNOSIS — M6281 Muscle weakness (generalized): Secondary | ICD-10-CM | POA: Diagnosis not present

## 2018-07-25 DIAGNOSIS — K219 Gastro-esophageal reflux disease without esophagitis: Secondary | ICD-10-CM | POA: Diagnosis not present

## 2018-07-25 DIAGNOSIS — M159 Polyosteoarthritis, unspecified: Secondary | ICD-10-CM | POA: Diagnosis not present

## 2018-07-25 DIAGNOSIS — M6281 Muscle weakness (generalized): Secondary | ICD-10-CM | POA: Diagnosis not present

## 2018-07-25 DIAGNOSIS — E039 Hypothyroidism, unspecified: Secondary | ICD-10-CM | POA: Diagnosis not present

## 2018-07-25 DIAGNOSIS — I10 Essential (primary) hypertension: Secondary | ICD-10-CM | POA: Diagnosis not present

## 2018-07-26 DIAGNOSIS — M6281 Muscle weakness (generalized): Secondary | ICD-10-CM | POA: Diagnosis not present

## 2018-07-26 DIAGNOSIS — M159 Polyosteoarthritis, unspecified: Secondary | ICD-10-CM | POA: Diagnosis not present

## 2018-07-27 DIAGNOSIS — M6281 Muscle weakness (generalized): Secondary | ICD-10-CM | POA: Diagnosis not present

## 2018-07-27 DIAGNOSIS — M159 Polyosteoarthritis, unspecified: Secondary | ICD-10-CM | POA: Diagnosis not present

## 2018-07-30 DIAGNOSIS — M159 Polyosteoarthritis, unspecified: Secondary | ICD-10-CM | POA: Diagnosis not present

## 2018-07-30 DIAGNOSIS — M6281 Muscle weakness (generalized): Secondary | ICD-10-CM | POA: Diagnosis not present

## 2018-07-31 DIAGNOSIS — M6281 Muscle weakness (generalized): Secondary | ICD-10-CM | POA: Diagnosis not present

## 2018-07-31 DIAGNOSIS — M159 Polyosteoarthritis, unspecified: Secondary | ICD-10-CM | POA: Diagnosis not present

## 2018-08-01 DIAGNOSIS — M159 Polyosteoarthritis, unspecified: Secondary | ICD-10-CM | POA: Diagnosis not present

## 2018-08-01 DIAGNOSIS — M6281 Muscle weakness (generalized): Secondary | ICD-10-CM | POA: Diagnosis not present

## 2018-08-02 DIAGNOSIS — M159 Polyosteoarthritis, unspecified: Secondary | ICD-10-CM | POA: Diagnosis not present

## 2018-08-02 DIAGNOSIS — M6281 Muscle weakness (generalized): Secondary | ICD-10-CM | POA: Diagnosis not present

## 2018-08-03 DIAGNOSIS — M6281 Muscle weakness (generalized): Secondary | ICD-10-CM | POA: Diagnosis not present

## 2018-08-03 DIAGNOSIS — M159 Polyosteoarthritis, unspecified: Secondary | ICD-10-CM | POA: Diagnosis not present

## 2018-08-06 DIAGNOSIS — M159 Polyosteoarthritis, unspecified: Secondary | ICD-10-CM | POA: Diagnosis not present

## 2018-08-06 DIAGNOSIS — R799 Abnormal finding of blood chemistry, unspecified: Secondary | ICD-10-CM | POA: Diagnosis not present

## 2018-08-06 DIAGNOSIS — N184 Chronic kidney disease, stage 4 (severe): Secondary | ICD-10-CM | POA: Diagnosis not present

## 2018-08-06 DIAGNOSIS — M6281 Muscle weakness (generalized): Secondary | ICD-10-CM | POA: Diagnosis not present

## 2018-08-06 DIAGNOSIS — W19XXXD Unspecified fall, subsequent encounter: Secondary | ICD-10-CM | POA: Diagnosis not present

## 2018-08-06 DIAGNOSIS — D649 Anemia, unspecified: Secondary | ICD-10-CM | POA: Diagnosis not present

## 2018-08-07 DIAGNOSIS — M159 Polyosteoarthritis, unspecified: Secondary | ICD-10-CM | POA: Diagnosis not present

## 2018-08-07 DIAGNOSIS — M6281 Muscle weakness (generalized): Secondary | ICD-10-CM | POA: Diagnosis not present

## 2018-08-08 DIAGNOSIS — M159 Polyosteoarthritis, unspecified: Secondary | ICD-10-CM | POA: Diagnosis not present

## 2018-08-08 DIAGNOSIS — M6281 Muscle weakness (generalized): Secondary | ICD-10-CM | POA: Diagnosis not present

## 2018-08-09 DIAGNOSIS — M159 Polyosteoarthritis, unspecified: Secondary | ICD-10-CM | POA: Diagnosis not present

## 2018-08-09 DIAGNOSIS — M6281 Muscle weakness (generalized): Secondary | ICD-10-CM | POA: Diagnosis not present

## 2018-08-10 DIAGNOSIS — G301 Alzheimer's disease with late onset: Secondary | ICD-10-CM | POA: Diagnosis not present

## 2018-08-10 DIAGNOSIS — M159 Polyosteoarthritis, unspecified: Secondary | ICD-10-CM | POA: Diagnosis not present

## 2018-08-10 DIAGNOSIS — F321 Major depressive disorder, single episode, moderate: Secondary | ICD-10-CM | POA: Diagnosis not present

## 2018-08-10 DIAGNOSIS — M6281 Muscle weakness (generalized): Secondary | ICD-10-CM | POA: Diagnosis not present

## 2018-08-10 DIAGNOSIS — F5101 Primary insomnia: Secondary | ICD-10-CM | POA: Diagnosis not present

## 2018-08-20 ENCOUNTER — Other Ambulatory Visit: Payer: Self-pay

## 2018-08-31 DIAGNOSIS — Z20828 Contact with and (suspected) exposure to other viral communicable diseases: Secondary | ICD-10-CM | POA: Diagnosis not present

## 2018-09-05 DIAGNOSIS — F0390 Unspecified dementia without behavioral disturbance: Secondary | ICD-10-CM | POA: Diagnosis not present

## 2018-09-05 DIAGNOSIS — D509 Iron deficiency anemia, unspecified: Secondary | ICD-10-CM | POA: Diagnosis not present

## 2018-09-05 DIAGNOSIS — E039 Hypothyroidism, unspecified: Secondary | ICD-10-CM | POA: Diagnosis not present

## 2018-09-07 DIAGNOSIS — F321 Major depressive disorder, single episode, moderate: Secondary | ICD-10-CM | POA: Diagnosis not present

## 2018-09-07 DIAGNOSIS — F5101 Primary insomnia: Secondary | ICD-10-CM | POA: Diagnosis not present

## 2018-09-07 DIAGNOSIS — G301 Alzheimer's disease with late onset: Secondary | ICD-10-CM | POA: Diagnosis not present

## 2018-09-17 DIAGNOSIS — K219 Gastro-esophageal reflux disease without esophagitis: Secondary | ICD-10-CM | POA: Diagnosis not present

## 2018-10-01 DIAGNOSIS — Z20828 Contact with and (suspected) exposure to other viral communicable diseases: Secondary | ICD-10-CM | POA: Diagnosis not present

## 2018-10-01 DIAGNOSIS — K219 Gastro-esophageal reflux disease without esophagitis: Secondary | ICD-10-CM | POA: Diagnosis not present

## 2018-10-04 DIAGNOSIS — I10 Essential (primary) hypertension: Secondary | ICD-10-CM | POA: Diagnosis not present

## 2018-10-04 DIAGNOSIS — E039 Hypothyroidism, unspecified: Secondary | ICD-10-CM | POA: Diagnosis not present

## 2018-10-04 DIAGNOSIS — E781 Pure hyperglyceridemia: Secondary | ICD-10-CM | POA: Diagnosis not present

## 2018-10-04 DIAGNOSIS — G40909 Epilepsy, unspecified, not intractable, without status epilepticus: Secondary | ICD-10-CM | POA: Diagnosis not present

## 2018-10-08 DIAGNOSIS — Z20828 Contact with and (suspected) exposure to other viral communicable diseases: Secondary | ICD-10-CM | POA: Diagnosis not present

## 2018-10-15 DIAGNOSIS — J309 Allergic rhinitis, unspecified: Secondary | ICD-10-CM | POA: Diagnosis not present

## 2018-10-15 DIAGNOSIS — Z20828 Contact with and (suspected) exposure to other viral communicable diseases: Secondary | ICD-10-CM | POA: Diagnosis not present

## 2018-10-15 DIAGNOSIS — K219 Gastro-esophageal reflux disease without esophagitis: Secondary | ICD-10-CM | POA: Diagnosis not present

## 2018-10-15 DIAGNOSIS — F0390 Unspecified dementia without behavioral disturbance: Secondary | ICD-10-CM | POA: Diagnosis not present

## 2018-10-23 DIAGNOSIS — Z20828 Contact with and (suspected) exposure to other viral communicable diseases: Secondary | ICD-10-CM | POA: Diagnosis not present

## 2018-10-26 DIAGNOSIS — Z20828 Contact with and (suspected) exposure to other viral communicable diseases: Secondary | ICD-10-CM | POA: Diagnosis not present

## 2018-10-30 DIAGNOSIS — J309 Allergic rhinitis, unspecified: Secondary | ICD-10-CM | POA: Diagnosis not present

## 2018-10-30 DIAGNOSIS — K219 Gastro-esophageal reflux disease without esophagitis: Secondary | ICD-10-CM | POA: Diagnosis not present

## 2018-10-31 DIAGNOSIS — Z20828 Contact with and (suspected) exposure to other viral communicable diseases: Secondary | ICD-10-CM | POA: Diagnosis not present

## 2018-11-04 DIAGNOSIS — Z20828 Contact with and (suspected) exposure to other viral communicable diseases: Secondary | ICD-10-CM | POA: Diagnosis not present

## 2018-11-08 DIAGNOSIS — J309 Allergic rhinitis, unspecified: Secondary | ICD-10-CM | POA: Diagnosis not present

## 2018-11-08 DIAGNOSIS — K219 Gastro-esophageal reflux disease without esophagitis: Secondary | ICD-10-CM | POA: Diagnosis not present

## 2018-11-09 DIAGNOSIS — F321 Major depressive disorder, single episode, moderate: Secondary | ICD-10-CM | POA: Diagnosis not present

## 2018-11-09 DIAGNOSIS — F5101 Primary insomnia: Secondary | ICD-10-CM | POA: Diagnosis not present

## 2018-11-09 DIAGNOSIS — G301 Alzheimer's disease with late onset: Secondary | ICD-10-CM | POA: Diagnosis not present

## 2018-11-12 DIAGNOSIS — Z20828 Contact with and (suspected) exposure to other viral communicable diseases: Secondary | ICD-10-CM | POA: Diagnosis not present

## 2018-11-17 DIAGNOSIS — Z20828 Contact with and (suspected) exposure to other viral communicable diseases: Secondary | ICD-10-CM | POA: Diagnosis not present

## 2018-11-23 DIAGNOSIS — Z9181 History of falling: Secondary | ICD-10-CM | POA: Diagnosis not present

## 2018-11-23 DIAGNOSIS — R2681 Unsteadiness on feet: Secondary | ICD-10-CM | POA: Diagnosis not present

## 2018-11-23 DIAGNOSIS — M6281 Muscle weakness (generalized): Secondary | ICD-10-CM | POA: Diagnosis not present

## 2018-11-23 DIAGNOSIS — F0390 Unspecified dementia without behavioral disturbance: Secondary | ICD-10-CM | POA: Diagnosis not present

## 2018-11-26 DIAGNOSIS — Z9181 History of falling: Secondary | ICD-10-CM | POA: Diagnosis not present

## 2018-11-26 DIAGNOSIS — R2681 Unsteadiness on feet: Secondary | ICD-10-CM | POA: Diagnosis not present

## 2018-11-26 DIAGNOSIS — M6281 Muscle weakness (generalized): Secondary | ICD-10-CM | POA: Diagnosis not present

## 2018-11-26 DIAGNOSIS — F0390 Unspecified dementia without behavioral disturbance: Secondary | ICD-10-CM | POA: Diagnosis not present

## 2018-11-27 DIAGNOSIS — F0390 Unspecified dementia without behavioral disturbance: Secondary | ICD-10-CM | POA: Diagnosis not present

## 2018-11-27 DIAGNOSIS — M6281 Muscle weakness (generalized): Secondary | ICD-10-CM | POA: Diagnosis not present

## 2018-11-27 DIAGNOSIS — R2681 Unsteadiness on feet: Secondary | ICD-10-CM | POA: Diagnosis not present

## 2018-11-27 DIAGNOSIS — Z9181 History of falling: Secondary | ICD-10-CM | POA: Diagnosis not present

## 2018-11-28 DIAGNOSIS — F0390 Unspecified dementia without behavioral disturbance: Secondary | ICD-10-CM | POA: Diagnosis not present

## 2018-11-28 DIAGNOSIS — B351 Tinea unguium: Secondary | ICD-10-CM | POA: Diagnosis not present

## 2018-11-28 DIAGNOSIS — M79674 Pain in right toe(s): Secondary | ICD-10-CM | POA: Diagnosis not present

## 2018-11-28 DIAGNOSIS — M6281 Muscle weakness (generalized): Secondary | ICD-10-CM | POA: Diagnosis not present

## 2018-11-28 DIAGNOSIS — Z9181 History of falling: Secondary | ICD-10-CM | POA: Diagnosis not present

## 2018-11-28 DIAGNOSIS — M79675 Pain in left toe(s): Secondary | ICD-10-CM | POA: Diagnosis not present

## 2018-11-28 DIAGNOSIS — R2681 Unsteadiness on feet: Secondary | ICD-10-CM | POA: Diagnosis not present

## 2018-11-29 DIAGNOSIS — R2681 Unsteadiness on feet: Secondary | ICD-10-CM | POA: Diagnosis not present

## 2018-11-29 DIAGNOSIS — M6281 Muscle weakness (generalized): Secondary | ICD-10-CM | POA: Diagnosis not present

## 2018-11-29 DIAGNOSIS — Z9181 History of falling: Secondary | ICD-10-CM | POA: Diagnosis not present

## 2018-11-29 DIAGNOSIS — F0390 Unspecified dementia without behavioral disturbance: Secondary | ICD-10-CM | POA: Diagnosis not present

## 2018-11-30 DIAGNOSIS — M6281 Muscle weakness (generalized): Secondary | ICD-10-CM | POA: Diagnosis not present

## 2018-11-30 DIAGNOSIS — R2681 Unsteadiness on feet: Secondary | ICD-10-CM | POA: Diagnosis not present

## 2018-11-30 DIAGNOSIS — F0390 Unspecified dementia without behavioral disturbance: Secondary | ICD-10-CM | POA: Diagnosis not present

## 2018-11-30 DIAGNOSIS — Z9181 History of falling: Secondary | ICD-10-CM | POA: Diagnosis not present

## 2018-12-03 DIAGNOSIS — M6281 Muscle weakness (generalized): Secondary | ICD-10-CM | POA: Diagnosis not present

## 2018-12-03 DIAGNOSIS — F0390 Unspecified dementia without behavioral disturbance: Secondary | ICD-10-CM | POA: Diagnosis not present

## 2018-12-03 DIAGNOSIS — Z9181 History of falling: Secondary | ICD-10-CM | POA: Diagnosis not present

## 2018-12-03 DIAGNOSIS — R2681 Unsteadiness on feet: Secondary | ICD-10-CM | POA: Diagnosis not present

## 2018-12-04 DIAGNOSIS — F0390 Unspecified dementia without behavioral disturbance: Secondary | ICD-10-CM | POA: Diagnosis not present

## 2018-12-04 DIAGNOSIS — Z9181 History of falling: Secondary | ICD-10-CM | POA: Diagnosis not present

## 2018-12-04 DIAGNOSIS — M6281 Muscle weakness (generalized): Secondary | ICD-10-CM | POA: Diagnosis not present

## 2018-12-04 DIAGNOSIS — R2681 Unsteadiness on feet: Secondary | ICD-10-CM | POA: Diagnosis not present

## 2018-12-05 DIAGNOSIS — F0390 Unspecified dementia without behavioral disturbance: Secondary | ICD-10-CM | POA: Diagnosis not present

## 2018-12-05 DIAGNOSIS — Z9181 History of falling: Secondary | ICD-10-CM | POA: Diagnosis not present

## 2018-12-05 DIAGNOSIS — R2681 Unsteadiness on feet: Secondary | ICD-10-CM | POA: Diagnosis not present

## 2018-12-05 DIAGNOSIS — M6281 Muscle weakness (generalized): Secondary | ICD-10-CM | POA: Diagnosis not present

## 2018-12-06 DIAGNOSIS — F0390 Unspecified dementia without behavioral disturbance: Secondary | ICD-10-CM | POA: Diagnosis not present

## 2018-12-06 DIAGNOSIS — Z9181 History of falling: Secondary | ICD-10-CM | POA: Diagnosis not present

## 2018-12-06 DIAGNOSIS — M6281 Muscle weakness (generalized): Secondary | ICD-10-CM | POA: Diagnosis not present

## 2018-12-06 DIAGNOSIS — R2681 Unsteadiness on feet: Secondary | ICD-10-CM | POA: Diagnosis not present

## 2018-12-07 DIAGNOSIS — M6281 Muscle weakness (generalized): Secondary | ICD-10-CM | POA: Diagnosis not present

## 2018-12-07 DIAGNOSIS — R2681 Unsteadiness on feet: Secondary | ICD-10-CM | POA: Diagnosis not present

## 2018-12-07 DIAGNOSIS — F0390 Unspecified dementia without behavioral disturbance: Secondary | ICD-10-CM | POA: Diagnosis not present

## 2018-12-07 DIAGNOSIS — Z9181 History of falling: Secondary | ICD-10-CM | POA: Diagnosis not present

## 2018-12-08 DIAGNOSIS — Z9181 History of falling: Secondary | ICD-10-CM | POA: Diagnosis not present

## 2018-12-08 DIAGNOSIS — M6281 Muscle weakness (generalized): Secondary | ICD-10-CM | POA: Diagnosis not present

## 2018-12-08 DIAGNOSIS — F0390 Unspecified dementia without behavioral disturbance: Secondary | ICD-10-CM | POA: Diagnosis not present

## 2018-12-08 DIAGNOSIS — R2681 Unsteadiness on feet: Secondary | ICD-10-CM | POA: Diagnosis not present

## 2018-12-10 DIAGNOSIS — F0390 Unspecified dementia without behavioral disturbance: Secondary | ICD-10-CM | POA: Diagnosis not present

## 2018-12-10 DIAGNOSIS — R2681 Unsteadiness on feet: Secondary | ICD-10-CM | POA: Diagnosis not present

## 2018-12-10 DIAGNOSIS — M6281 Muscle weakness (generalized): Secondary | ICD-10-CM | POA: Diagnosis not present

## 2018-12-10 DIAGNOSIS — Z9181 History of falling: Secondary | ICD-10-CM | POA: Diagnosis not present

## 2018-12-11 DIAGNOSIS — F329 Major depressive disorder, single episode, unspecified: Secondary | ICD-10-CM | POA: Diagnosis not present

## 2018-12-11 DIAGNOSIS — Z9181 History of falling: Secondary | ICD-10-CM | POA: Diagnosis not present

## 2018-12-11 DIAGNOSIS — M6281 Muscle weakness (generalized): Secondary | ICD-10-CM | POA: Diagnosis not present

## 2018-12-11 DIAGNOSIS — F0390 Unspecified dementia without behavioral disturbance: Secondary | ICD-10-CM | POA: Diagnosis not present

## 2018-12-11 DIAGNOSIS — K219 Gastro-esophageal reflux disease without esophagitis: Secondary | ICD-10-CM | POA: Diagnosis not present

## 2018-12-11 DIAGNOSIS — R2681 Unsteadiness on feet: Secondary | ICD-10-CM | POA: Diagnosis not present

## 2018-12-11 DIAGNOSIS — I10 Essential (primary) hypertension: Secondary | ICD-10-CM | POA: Diagnosis not present

## 2018-12-12 DIAGNOSIS — Z9181 History of falling: Secondary | ICD-10-CM | POA: Diagnosis not present

## 2018-12-12 DIAGNOSIS — M6281 Muscle weakness (generalized): Secondary | ICD-10-CM | POA: Diagnosis not present

## 2018-12-12 DIAGNOSIS — F0390 Unspecified dementia without behavioral disturbance: Secondary | ICD-10-CM | POA: Diagnosis not present

## 2018-12-12 DIAGNOSIS — R2681 Unsteadiness on feet: Secondary | ICD-10-CM | POA: Diagnosis not present

## 2018-12-14 DIAGNOSIS — F0390 Unspecified dementia without behavioral disturbance: Secondary | ICD-10-CM | POA: Diagnosis not present

## 2018-12-14 DIAGNOSIS — R2681 Unsteadiness on feet: Secondary | ICD-10-CM | POA: Diagnosis not present

## 2018-12-14 DIAGNOSIS — M6281 Muscle weakness (generalized): Secondary | ICD-10-CM | POA: Diagnosis not present

## 2018-12-14 DIAGNOSIS — Z9181 History of falling: Secondary | ICD-10-CM | POA: Diagnosis not present

## 2018-12-17 DIAGNOSIS — F0390 Unspecified dementia without behavioral disturbance: Secondary | ICD-10-CM | POA: Diagnosis not present

## 2018-12-17 DIAGNOSIS — M6281 Muscle weakness (generalized): Secondary | ICD-10-CM | POA: Diagnosis not present

## 2018-12-17 DIAGNOSIS — Z9181 History of falling: Secondary | ICD-10-CM | POA: Diagnosis not present

## 2018-12-17 DIAGNOSIS — R2681 Unsteadiness on feet: Secondary | ICD-10-CM | POA: Diagnosis not present

## 2019-01-18 DIAGNOSIS — M6281 Muscle weakness (generalized): Secondary | ICD-10-CM | POA: Diagnosis not present

## 2019-01-18 DIAGNOSIS — Z9181 History of falling: Secondary | ICD-10-CM | POA: Diagnosis not present

## 2019-01-18 DIAGNOSIS — R2681 Unsteadiness on feet: Secondary | ICD-10-CM | POA: Diagnosis not present

## 2019-01-18 DIAGNOSIS — F0390 Unspecified dementia without behavioral disturbance: Secondary | ICD-10-CM | POA: Diagnosis not present

## 2019-01-21 DIAGNOSIS — R7989 Other specified abnormal findings of blood chemistry: Secondary | ICD-10-CM | POA: Diagnosis not present

## 2019-01-21 DIAGNOSIS — D649 Anemia, unspecified: Secondary | ICD-10-CM | POA: Diagnosis not present

## 2019-01-21 DIAGNOSIS — Z20828 Contact with and (suspected) exposure to other viral communicable diseases: Secondary | ICD-10-CM | POA: Diagnosis not present

## 2019-01-21 DIAGNOSIS — E039 Hypothyroidism, unspecified: Secondary | ICD-10-CM | POA: Diagnosis not present

## 2019-01-21 DIAGNOSIS — E785 Hyperlipidemia, unspecified: Secondary | ICD-10-CM | POA: Diagnosis not present

## 2019-01-21 DIAGNOSIS — Z79899 Other long term (current) drug therapy: Secondary | ICD-10-CM | POA: Diagnosis not present

## 2019-01-21 DIAGNOSIS — D519 Vitamin B12 deficiency anemia, unspecified: Secondary | ICD-10-CM | POA: Diagnosis not present

## 2019-01-21 DIAGNOSIS — E119 Type 2 diabetes mellitus without complications: Secondary | ICD-10-CM | POA: Diagnosis not present

## 2019-01-22 DIAGNOSIS — Z23 Encounter for immunization: Secondary | ICD-10-CM | POA: Diagnosis not present

## 2019-01-22 DIAGNOSIS — K219 Gastro-esophageal reflux disease without esophagitis: Secondary | ICD-10-CM | POA: Diagnosis present

## 2019-01-22 DIAGNOSIS — R2681 Unsteadiness on feet: Secondary | ICD-10-CM | POA: Diagnosis present

## 2019-01-22 DIAGNOSIS — E781 Pure hyperglyceridemia: Secondary | ICD-10-CM | POA: Diagnosis not present

## 2019-01-22 DIAGNOSIS — Z79899 Other long term (current) drug therapy: Secondary | ICD-10-CM | POA: Diagnosis not present

## 2019-01-22 DIAGNOSIS — M6281 Muscle weakness (generalized): Secondary | ICD-10-CM | POA: Diagnosis present

## 2019-01-22 DIAGNOSIS — F329 Major depressive disorder, single episode, unspecified: Secondary | ICD-10-CM | POA: Diagnosis present

## 2019-01-22 DIAGNOSIS — I10 Essential (primary) hypertension: Secondary | ICD-10-CM | POA: Diagnosis present

## 2019-01-22 DIAGNOSIS — Z9181 History of falling: Secondary | ICD-10-CM | POA: Diagnosis not present

## 2019-01-22 DIAGNOSIS — G40909 Epilepsy, unspecified, not intractable, without status epilepticus: Secondary | ICD-10-CM | POA: Diagnosis present

## 2019-01-22 DIAGNOSIS — F0390 Unspecified dementia without behavioral disturbance: Secondary | ICD-10-CM | POA: Diagnosis present

## 2019-01-22 DIAGNOSIS — E039 Hypothyroidism, unspecified: Secondary | ICD-10-CM | POA: Diagnosis present

## 2019-01-22 DIAGNOSIS — G4730 Sleep apnea, unspecified: Secondary | ICD-10-CM | POA: Diagnosis present

## 2019-01-22 DIAGNOSIS — M810 Age-related osteoporosis without current pathological fracture: Secondary | ICD-10-CM | POA: Diagnosis present

## 2019-01-22 DIAGNOSIS — U071 COVID-19: Secondary | ICD-10-CM | POA: Diagnosis not present

## 2019-01-22 DIAGNOSIS — Z8781 Personal history of (healed) traumatic fracture: Secondary | ICD-10-CM | POA: Diagnosis not present

## 2019-01-22 DIAGNOSIS — M159 Polyosteoarthritis, unspecified: Secondary | ICD-10-CM | POA: Diagnosis present

## 2019-01-22 DIAGNOSIS — R41841 Cognitive communication deficit: Secondary | ICD-10-CM | POA: Diagnosis present

## 2019-01-22 DIAGNOSIS — D509 Iron deficiency anemia, unspecified: Secondary | ICD-10-CM | POA: Diagnosis not present

## 2019-01-24 DIAGNOSIS — I10 Essential (primary) hypertension: Secondary | ICD-10-CM | POA: Diagnosis not present

## 2019-01-24 DIAGNOSIS — U071 COVID-19: Secondary | ICD-10-CM | POA: Diagnosis not present

## 2019-01-24 DIAGNOSIS — E781 Pure hyperglyceridemia: Secondary | ICD-10-CM | POA: Diagnosis not present

## 2019-01-24 DIAGNOSIS — D509 Iron deficiency anemia, unspecified: Secondary | ICD-10-CM | POA: Diagnosis not present

## 2019-02-19 DIAGNOSIS — Z23 Encounter for immunization: Secondary | ICD-10-CM | POA: Diagnosis not present

## 2019-02-25 DIAGNOSIS — D509 Iron deficiency anemia, unspecified: Secondary | ICD-10-CM | POA: Diagnosis not present

## 2019-02-25 DIAGNOSIS — D649 Anemia, unspecified: Secondary | ICD-10-CM | POA: Diagnosis not present

## 2019-03-14 DIAGNOSIS — G301 Alzheimer's disease with late onset: Secondary | ICD-10-CM | POA: Diagnosis not present

## 2019-03-14 DIAGNOSIS — F321 Major depressive disorder, single episode, moderate: Secondary | ICD-10-CM | POA: Diagnosis not present

## 2019-03-14 DIAGNOSIS — F5101 Primary insomnia: Secondary | ICD-10-CM | POA: Diagnosis not present

## 2019-03-19 DIAGNOSIS — K5900 Constipation, unspecified: Secondary | ICD-10-CM | POA: Diagnosis not present

## 2019-03-19 DIAGNOSIS — D509 Iron deficiency anemia, unspecified: Secondary | ICD-10-CM | POA: Diagnosis not present

## 2019-03-19 DIAGNOSIS — E039 Hypothyroidism, unspecified: Secondary | ICD-10-CM | POA: Diagnosis not present

## 2019-04-25 DIAGNOSIS — F5101 Primary insomnia: Secondary | ICD-10-CM | POA: Diagnosis not present

## 2019-04-25 DIAGNOSIS — F321 Major depressive disorder, single episode, moderate: Secondary | ICD-10-CM | POA: Diagnosis not present

## 2019-04-25 DIAGNOSIS — G301 Alzheimer's disease with late onset: Secondary | ICD-10-CM | POA: Diagnosis not present

## 2019-05-17 DIAGNOSIS — D509 Iron deficiency anemia, unspecified: Secondary | ICD-10-CM | POA: Diagnosis not present

## 2019-05-17 DIAGNOSIS — I10 Essential (primary) hypertension: Secondary | ICD-10-CM | POA: Diagnosis not present

## 2019-05-17 DIAGNOSIS — E039 Hypothyroidism, unspecified: Secondary | ICD-10-CM | POA: Diagnosis not present

## 2019-05-17 DIAGNOSIS — F0390 Unspecified dementia without behavioral disturbance: Secondary | ICD-10-CM | POA: Diagnosis not present

## 2019-05-30 DIAGNOSIS — G301 Alzheimer's disease with late onset: Secondary | ICD-10-CM | POA: Diagnosis not present

## 2019-05-30 DIAGNOSIS — F321 Major depressive disorder, single episode, moderate: Secondary | ICD-10-CM | POA: Diagnosis not present

## 2019-05-30 DIAGNOSIS — F5101 Primary insomnia: Secondary | ICD-10-CM | POA: Diagnosis not present

## 2019-06-05 DIAGNOSIS — F0390 Unspecified dementia without behavioral disturbance: Secondary | ICD-10-CM | POA: Diagnosis not present

## 2019-06-05 DIAGNOSIS — M6281 Muscle weakness (generalized): Secondary | ICD-10-CM | POA: Diagnosis not present

## 2019-06-06 DIAGNOSIS — F0390 Unspecified dementia without behavioral disturbance: Secondary | ICD-10-CM | POA: Diagnosis not present

## 2019-06-06 DIAGNOSIS — M6281 Muscle weakness (generalized): Secondary | ICD-10-CM | POA: Diagnosis not present

## 2019-06-07 DIAGNOSIS — M6281 Muscle weakness (generalized): Secondary | ICD-10-CM | POA: Diagnosis not present

## 2019-06-07 DIAGNOSIS — F0390 Unspecified dementia without behavioral disturbance: Secondary | ICD-10-CM | POA: Diagnosis not present

## 2019-06-08 DIAGNOSIS — F0390 Unspecified dementia without behavioral disturbance: Secondary | ICD-10-CM | POA: Diagnosis not present

## 2019-06-08 DIAGNOSIS — M6281 Muscle weakness (generalized): Secondary | ICD-10-CM | POA: Diagnosis not present

## 2019-06-11 DIAGNOSIS — M6281 Muscle weakness (generalized): Secondary | ICD-10-CM | POA: Diagnosis not present

## 2019-06-11 DIAGNOSIS — F0390 Unspecified dementia without behavioral disturbance: Secondary | ICD-10-CM | POA: Diagnosis not present

## 2019-06-12 DIAGNOSIS — F0390 Unspecified dementia without behavioral disturbance: Secondary | ICD-10-CM | POA: Diagnosis not present

## 2019-06-12 DIAGNOSIS — M6281 Muscle weakness (generalized): Secondary | ICD-10-CM | POA: Diagnosis not present

## 2019-06-13 DIAGNOSIS — F0390 Unspecified dementia without behavioral disturbance: Secondary | ICD-10-CM | POA: Diagnosis not present

## 2019-06-13 DIAGNOSIS — M6281 Muscle weakness (generalized): Secondary | ICD-10-CM | POA: Diagnosis not present

## 2019-06-14 DIAGNOSIS — F0390 Unspecified dementia without behavioral disturbance: Secondary | ICD-10-CM | POA: Diagnosis not present

## 2019-06-14 DIAGNOSIS — M6281 Muscle weakness (generalized): Secondary | ICD-10-CM | POA: Diagnosis not present

## 2019-08-16 DIAGNOSIS — F5105 Insomnia due to other mental disorder: Secondary | ICD-10-CM | POA: Diagnosis not present

## 2019-08-16 DIAGNOSIS — G301 Alzheimer's disease with late onset: Secondary | ICD-10-CM | POA: Diagnosis not present

## 2019-08-16 DIAGNOSIS — F321 Major depressive disorder, single episode, moderate: Secondary | ICD-10-CM | POA: Diagnosis not present

## 2019-09-16 DIAGNOSIS — E039 Hypothyroidism, unspecified: Secondary | ICD-10-CM | POA: Diagnosis not present

## 2019-09-16 DIAGNOSIS — D509 Iron deficiency anemia, unspecified: Secondary | ICD-10-CM | POA: Diagnosis not present

## 2019-09-16 DIAGNOSIS — F329 Major depressive disorder, single episode, unspecified: Secondary | ICD-10-CM | POA: Diagnosis not present

## 2019-09-16 DIAGNOSIS — G40909 Epilepsy, unspecified, not intractable, without status epilepticus: Secondary | ICD-10-CM | POA: Diagnosis not present

## 2019-09-16 DIAGNOSIS — K5900 Constipation, unspecified: Secondary | ICD-10-CM | POA: Diagnosis not present

## 2019-09-16 DIAGNOSIS — K219 Gastro-esophageal reflux disease without esophagitis: Secondary | ICD-10-CM | POA: Diagnosis not present

## 2019-09-16 DIAGNOSIS — I10 Essential (primary) hypertension: Secondary | ICD-10-CM | POA: Diagnosis not present

## 2019-09-16 DIAGNOSIS — F0390 Unspecified dementia without behavioral disturbance: Secondary | ICD-10-CM | POA: Diagnosis not present

## 2019-09-20 DIAGNOSIS — F321 Major depressive disorder, single episode, moderate: Secondary | ICD-10-CM | POA: Diagnosis not present

## 2019-09-20 DIAGNOSIS — G301 Alzheimer's disease with late onset: Secondary | ICD-10-CM | POA: Diagnosis not present

## 2019-09-20 DIAGNOSIS — F5105 Insomnia due to other mental disorder: Secondary | ICD-10-CM | POA: Diagnosis not present

## 2019-09-22 ENCOUNTER — Other Ambulatory Visit: Payer: Self-pay

## 2019-09-22 ENCOUNTER — Emergency Department (HOSPITAL_COMMUNITY): Payer: Medicare Other

## 2019-09-22 ENCOUNTER — Encounter (HOSPITAL_COMMUNITY): Payer: Self-pay | Admitting: Emergency Medicine

## 2019-09-22 ENCOUNTER — Emergency Department (HOSPITAL_COMMUNITY)
Admission: EM | Admit: 2019-09-22 | Discharge: 2019-09-23 | Disposition: A | Discharge status: Home / Self Care | Payer: Medicare Other | Attending: Emergency Medicine | Admitting: Emergency Medicine

## 2019-09-22 DIAGNOSIS — Z20822 Contact with and (suspected) exposure to covid-19: Secondary | ICD-10-CM | POA: Diagnosis not present

## 2019-09-22 DIAGNOSIS — I10 Essential (primary) hypertension: Secondary | ICD-10-CM | POA: Diagnosis not present

## 2019-09-22 DIAGNOSIS — R4182 Altered mental status, unspecified: Secondary | ICD-10-CM | POA: Diagnosis not present

## 2019-09-22 DIAGNOSIS — N390 Urinary tract infection, site not specified: Secondary | ICD-10-CM | POA: Diagnosis not present

## 2019-09-22 DIAGNOSIS — R410 Disorientation, unspecified: Secondary | ICD-10-CM | POA: Diagnosis not present

## 2019-09-22 DIAGNOSIS — R000 Tachycardia, unspecified: Secondary | ICD-10-CM | POA: Diagnosis not present

## 2019-09-22 LAB — COMPREHENSIVE METABOLIC PANEL
ALT: 16 U/L (ref 0–44)
AST: 25 U/L (ref 15–41)
Albumin: 3.9 g/dL (ref 3.5–5.0)
Alkaline Phosphatase: 73 U/L (ref 38–126)
Anion gap: 9 (ref 5–15)
BUN: 25 mg/dL — ABNORMAL HIGH (ref 8–23)
CO2: 25 mmol/L (ref 22–32)
Calcium: 9 mg/dL (ref 8.9–10.3)
Chloride: 101 mmol/L (ref 98–111)
Creatinine, Ser: 1.04 mg/dL — ABNORMAL HIGH (ref 0.44–1.00)
GFR calc Af Amer: 57 mL/min — ABNORMAL LOW (ref >60)
GFR calc non Af Amer: 49 mL/min — ABNORMAL LOW (ref >60)
Glucose, Bld: 106 mg/dL — ABNORMAL HIGH (ref 70–99)
Potassium: 3.9 mmol/L (ref 3.5–5.1)
Sodium: 135 mmol/L (ref 135–145)
Total Bilirubin: 0.5 mg/dL (ref 0.3–1.2)
Total Protein: 7.4 g/dL (ref 6.5–8.1)

## 2019-09-22 LAB — CBC
HCT: 40.3 % (ref 36.0–46.0)
Hemoglobin: 12.5 g/dL (ref 12.0–15.0)
MCH: 26.8 pg (ref 26.0–34.0)
MCHC: 31 g/dL (ref 30.0–36.0)
MCV: 86.3 fL (ref 80.0–100.0)
Platelets: 260 10*3/uL (ref 150–400)
RBC: 4.67 MIL/uL (ref 3.87–5.11)
RDW: 14.8 % (ref 11.5–15.5)
WBC: 8.1 10*3/uL (ref 4.0–10.5)
nRBC: 0 % (ref 0.0–0.2)

## 2019-09-22 LAB — SARS CORONAVIRUS 2 BY RT PCR (HOSPITAL ORDER, PERFORMED IN ~~LOC~~ HOSPITAL LAB): SARS Coronavirus 2: NEGATIVE

## 2019-09-22 MED ORDER — SODIUM CHLORIDE 0.9 % IV BOLUS
500.0000 mL | Freq: Once | INTRAVENOUS | Status: AC
  Administered 2019-09-22: 500 mL via INTRAVENOUS

## 2019-09-22 NOTE — ED Notes (Signed)
purewick placed to collect sample

## 2019-09-22 NOTE — ED Provider Notes (Addendum)
[facility] Emergency Department Provider Note MRN:  [id number]  Arrival date & time: 09/22/19     Chief Complaint   Altered Mental Status   History of Present Illness   Brittney Reynolds is a 86 y.o. year-old female with a history of hypertension, dementia presenting to the ED with chief complaint of altered mental status.  Report of altered mental status since 5 PM today.  Sent here from care facility.  Patient is without complaints.  Has dementia.  I was unable to obtain an accurate HPI, PMH, or ROS due to the patient's dementia.  Level 5 caveat.  Review of Systems  Positive for altered mental status.  Patient's Health History    Past Medical History:  Diagnosis Date  . Anemia   . Anxiety   . Colon polyp 5 yrs ago   in  [place]  . Depression   . HTN (hypertension)   . Polio    As a child  . Seizures ([place])     Past Surgical History:  Procedure Laterality Date  . FOOT SURGERY  11/11   Left foot  . OTIF  2009   Right ankle    History reviewed. No pertinent family history.  Social History   Socioeconomic History  . Marital status: Single    Spouse name: Not on file  . Number of children: Not on file  . Years of education: Not on file  . Highest education level: Not on file  Occupational History  . Not on file  Tobacco Use  . Smoking status: Never Smoker  Substance and Sexual Activity  . Alcohol use: No  . Drug use: No  . Sexual activity: Never  Other Topics Concern  . Not on file  Social History Narrative  . Not on file   Social Determinants of Health   Financial Resource Strain:   . Difficulty of Paying Living Expenses: Not on file  Food Insecurity:   . Worried About Charity fundraiser in the Last Year: Not on file  . Ran Out of Food in the Last Year: Not on file  Transportation Needs:   . Lack of Transportation (Medical): Not on file  . Lack of Transportation (Non-Medical): Not on file  Physical Activity:   .  Days of Exercise per Week: Not on file  . Minutes of Exercise per Session: Not on file  Stress:   . Feeling of Stress : Not on file  Social Connections:   . Frequency of Communication with Friends and Family: Not on file  . Frequency of Social Gatherings with Friends and Family: Not on file  . Attends Religious Services: Not on file  . Active Member of Clubs or Organizations: Not on file  . Attends Archivist Meetings: Not on file  . Marital Status: Not on file  Intimate Partner Violence:   . Fear of Current or Ex-Partner: Not on file  . Emotionally Abused: Not on file  . Physically Abused: Not on file  . Sexually Abused: Not on file     Physical Exam   Vitals:   09/22/19 2132 09/22/19 2230  BP:  (!) 170/82  Pulse:    Resp:  20  Temp: 98.1 F (36.7 C)   SpO2:      CONSTITUTIONAL: Well-appearing, NAD NEURO: Awake, alert, oriented to name, no focal neurological deficits EYES:  eyes equal and reactive ENT/NECK:  no LAD, no JVD CARDIO: Tachycardia rate, well-perfused, normal S1 and S2  PULM:  CTAB no wheezing or rhonchi GI/GU:  normal bowel sounds, non-distended, non-tender MSK/SPINE:  No gross deformities, no edema SKIN:  no rash, atraumatic PSYCH:  Appropriate speech and behavior  *Additional and/or pertinent findings included in MDM below  Diagnostic and Interventional Summary    EKG Interpretation  Date/Time:  Sunday September 22 2019 20:59:01 EDT Ventricular Rate:  114 PR Interval:    QRS Duration: 97 QT Interval:  340 QTC Calculation: 469 R Axis:   -41 Text Interpretation: Sinus tachycardia Abnormal R-wave progression, late transition LVH with secondary repolarization abnormality Confirmed by Gerlene Fee [phone]) on 09/22/2019 10:19:19 PM      Labs Reviewed  COMPREHENSIVE METABOLIC PANEL - Abnormal; Notable for the following components:      Result Value   Glucose, Bld 106 (*)    BUN 25 (*)    Creatinine, Ser 1.04 (*)    GFR calc non Af Amer 49  (*)    GFR calc Af Amer 57 (*)    All other components within normal limits  SARS CORONAVIRUS 2 BY RT PCR (HOSPITAL ORDER, [facility])  CBC  URINALYSIS, ROUTINE W REFLEX MICROSCOPIC    CT HEAD WO CONTRAST  Final Result    DG Chest Port 1 View  Final Result      Medications  sodium chloride 0.9 % bolus 500 mL (0 mLs Intravenous Stopped 09/22/19 2254)     Procedures  /  Critical Care Procedures  ED Course and Medical Decision Making  I have reviewed the triage vital signs, the nursing notes, and pertinent available records from the EMR.  Listed above are laboratory and imaging tests that I personally ordered, reviewed, and interpreted and then considered in my medical decision making (see below for details).  Well-appearing, awake, is not oriented to place or time but has a history of dementia, suspect this may be her baseline but not sure.  Did not receive much per report from EMS, so far unable to reach family.  Patient arrives with mild tachycardia between 100 and 120.  Rectal temp 98.1.  Benign abdomen, no complaints, no pain.  Will evaluate for metabolic disarray, UTI, AKI. Clinical Course as of Sep 21 2336  Sun Sep 22, 2019  2328 Patient with surprisingly normal neuro exam given her CT head findings.  Subtle left facial droop but otherwise fairly symmetric.    [MB]    Clinical Course User Index [MB] Maudie Flakes, MD     Work-up thus far reassuring, still awaiting urinalysis.  If able to improve heart rate, would be a candidate for discharge, signed out to oncoming provider at shift change.  Barth Kirks. Sedonia Small, [place] [email or web address]  Final Clinical Impressions(s) / ED Diagnoses     ICD-10-CM   1. Altered mental status, unspecified altered mental status type  R41.82     ED Discharge Orders    None       Discharge Instructions Discussed with and Provided to Patient:    Discharge Instructions   None       Maudie Flakes, MD 09/22/19 2248    Maudie Flakes, MD 09/22/19 [phone]

## 2019-09-22 NOTE — ED Triage Notes (Signed)
Pt arrives via EMS for AMS since this afternoon since about 5pm.

## 2019-09-23 DIAGNOSIS — R4182 Altered mental status, unspecified: Secondary | ICD-10-CM | POA: Diagnosis not present

## 2019-09-23 LAB — URINALYSIS, ROUTINE W REFLEX MICROSCOPIC
Bilirubin Urine: NEGATIVE
Glucose, UA: NEGATIVE mg/dL
Hgb urine dipstick: NEGATIVE
Ketones, ur: NEGATIVE mg/dL
Nitrite: NEGATIVE
Protein, ur: NEGATIVE mg/dL
Specific Gravity, Urine: 1.006 (ref 1.005–1.030)
WBC, UA: >50 WBC/hpf — ABNORMAL HIGH (ref 0–5)
pH: 7 (ref 5.0–8.0)

## 2019-09-23 MED ORDER — CEPHALEXIN 500 MG PO CAPS
500.0000 mg | ORAL_CAPSULE | Freq: Once | ORAL | Status: AC
  Administered 2019-09-23: 500 mg via ORAL
  Filled 2019-09-23: qty 1

## 2019-09-23 MED ORDER — CEPHALEXIN 500 MG PO CAPS: 500.0000 mg | ORAL_CAPSULE | Freq: Three times a day (TID) | ORAL | 0 refills | Status: DC

## 2019-09-23 NOTE — Discharge Instructions (Addendum)
Give her the antibiotics until gone. Have her rechecked if she gets vomiting, high fever, or seems worse. The culture result can be rechecked in the next 2 to 3 days to make sure I chose the correct antibiotic.

## 2019-09-23 NOTE — ED Provider Notes (Signed)
Patient left at change of shift, patient has history of dementia and lives in a nursing facility.  She was sent to the ED tonight for being "more altered".  Her labs so far have been unrevealing, she is waiting for a urinalysis to decide if she needs antibiotics.  She should hopefully be able to be discharged back to her facility because she seems to be at her baseline mentally.  After reviewing patient's urine urine culture was sent and she was started on oral Keflex. Nurses report patient was able to swallow the Keflex without difficulty. When I checked on the patient she is resting comfortably with pulse ox 98- 100% on room air. She was discharged back to her facility.    Medications  sodium chloride 0.9 % bolus 500 mL (0 mLs Intravenous Stopped 09/22/19 2254)  cephALEXin (KEFLEX) capsule 500 mg (500 mg Oral Given 09/23/19 0204)     Results for orders placed or performed during the hospital encounter of 09/22/19  SARS Coronavirus 2 by RT PCR (hospital order, performed in [place] hospital lab) Nasopharyngeal Nasopharyngeal Swab   Specimen: Nasopharyngeal Swab  Result Value Ref Range   SARS Coronavirus 2 NEGATIVE NEGATIVE  Urinalysis, Routine w reflex microscopic  Result Value Ref Range   Color, Urine STRAW (A) YELLOW   APPearance CLEAR CLEAR   Specific Gravity, Urine 1.006 1.005 - 1.030   pH 7.0 5.0 - 8.0   Glucose, UA NEGATIVE NEGATIVE mg/dL   Hgb urine dipstick NEGATIVE NEGATIVE   Bilirubin Urine NEGATIVE NEGATIVE   Ketones, ur NEGATIVE NEGATIVE mg/dL   Protein, ur NEGATIVE NEGATIVE mg/dL   Nitrite NEGATIVE NEGATIVE   Leukocytes,Ua SMALL (A) NEGATIVE   RBC / HPF 0-5 0 - 5 RBC/hpf   WBC, UA >50 (H) 0 - 5 WBC/hpf   Bacteria, UA RARE (A) NONE SEEN   Squamous Epithelial / LPF 0-5 0 - 5   Laboratory interpretation all normal except probable UTI, urine culture sent  Diagnoses that have been ruled out:  None  Diagnoses that are still under consideration:  None  Final diagnoses:   Altered mental status, unspecified altered mental status type  Urinary tract infection without hematuria, site unspecified   ED Discharge Orders         Ordered    cephALEXin (KEFLEX) 500 MG capsule  3 times daily        09/23/19 0240         Plan discharge  Rolland Porter, MD, Barbette Or, MD 09/23/19 [phone]

## 2019-09-24 LAB — URINE CULTURE

## 2019-09-26 DIAGNOSIS — E039 Hypothyroidism, unspecified: Secondary | ICD-10-CM | POA: Diagnosis not present

## 2019-09-26 DIAGNOSIS — F329 Major depressive disorder, single episode, unspecified: Secondary | ICD-10-CM | POA: Diagnosis not present

## 2019-09-26 DIAGNOSIS — E781 Pure hyperglyceridemia: Secondary | ICD-10-CM | POA: Diagnosis not present

## 2019-09-26 DIAGNOSIS — G40909 Epilepsy, unspecified, not intractable, without status epilepticus: Secondary | ICD-10-CM | POA: Diagnosis not present

## 2019-09-26 DIAGNOSIS — D509 Iron deficiency anemia, unspecified: Secondary | ICD-10-CM | POA: Diagnosis not present

## 2019-09-26 DIAGNOSIS — F0390 Unspecified dementia without behavioral disturbance: Secondary | ICD-10-CM | POA: Diagnosis not present

## 2019-10-08 DIAGNOSIS — H353132 Nonexudative age-related macular degeneration, bilateral, intermediate dry stage: Secondary | ICD-10-CM | POA: Diagnosis not present

## 2019-10-08 DIAGNOSIS — H02839 Dermatochalasis of unspecified eye, unspecified eyelid: Secondary | ICD-10-CM | POA: Diagnosis not present

## 2019-10-08 DIAGNOSIS — Z961 Presence of intraocular lens: Secondary | ICD-10-CM | POA: Diagnosis not present

## 2019-10-08 DIAGNOSIS — H524 Presbyopia: Secondary | ICD-10-CM | POA: Diagnosis not present

## 2019-10-15 DIAGNOSIS — I10 Essential (primary) hypertension: Secondary | ICD-10-CM | POA: Diagnosis not present

## 2019-10-15 DIAGNOSIS — B91 Sequelae of poliomyelitis: Secondary | ICD-10-CM | POA: Diagnosis not present

## 2019-10-15 DIAGNOSIS — R269 Unspecified abnormalities of gait and mobility: Secondary | ICD-10-CM | POA: Diagnosis not present

## 2019-10-15 DIAGNOSIS — G4733 Obstructive sleep apnea (adult) (pediatric): Secondary | ICD-10-CM | POA: Diagnosis not present

## 2019-10-15 DIAGNOSIS — R569 Unspecified convulsions: Secondary | ICD-10-CM | POA: Diagnosis not present

## 2019-10-15 DIAGNOSIS — G9389 Other specified disorders of brain: Secondary | ICD-10-CM | POA: Diagnosis not present

## 2019-11-05 DIAGNOSIS — E781 Pure hyperglyceridemia: Secondary | ICD-10-CM | POA: Diagnosis not present

## 2019-11-05 DIAGNOSIS — I10 Essential (primary) hypertension: Secondary | ICD-10-CM | POA: Diagnosis not present

## 2019-11-05 DIAGNOSIS — E039 Hypothyroidism, unspecified: Secondary | ICD-10-CM | POA: Diagnosis not present

## 2019-11-13 DIAGNOSIS — M6281 Muscle weakness (generalized): Secondary | ICD-10-CM | POA: Diagnosis not present

## 2019-11-13 DIAGNOSIS — R2681 Unsteadiness on feet: Secondary | ICD-10-CM | POA: Diagnosis not present

## 2019-11-13 DIAGNOSIS — F0390 Unspecified dementia without behavioral disturbance: Secondary | ICD-10-CM | POA: Diagnosis not present

## 2019-11-14 DIAGNOSIS — M6281 Muscle weakness (generalized): Secondary | ICD-10-CM | POA: Diagnosis not present

## 2019-11-14 DIAGNOSIS — R2681 Unsteadiness on feet: Secondary | ICD-10-CM | POA: Diagnosis not present

## 2019-11-14 DIAGNOSIS — F0390 Unspecified dementia without behavioral disturbance: Secondary | ICD-10-CM | POA: Diagnosis not present

## 2019-11-15 DIAGNOSIS — R2681 Unsteadiness on feet: Secondary | ICD-10-CM | POA: Diagnosis not present

## 2019-11-15 DIAGNOSIS — M6281 Muscle weakness (generalized): Secondary | ICD-10-CM | POA: Diagnosis not present

## 2019-11-15 DIAGNOSIS — F0390 Unspecified dementia without behavioral disturbance: Secondary | ICD-10-CM | POA: Diagnosis not present

## 2019-11-18 DIAGNOSIS — F0390 Unspecified dementia without behavioral disturbance: Secondary | ICD-10-CM | POA: Diagnosis not present

## 2019-11-18 DIAGNOSIS — M6281 Muscle weakness (generalized): Secondary | ICD-10-CM | POA: Diagnosis not present

## 2019-11-18 DIAGNOSIS — R4182 Altered mental status, unspecified: Secondary | ICD-10-CM | POA: Diagnosis not present

## 2019-11-18 DIAGNOSIS — R296 Repeated falls: Secondary | ICD-10-CM | POA: Diagnosis not present

## 2019-11-18 DIAGNOSIS — G40909 Epilepsy, unspecified, not intractable, without status epilepticus: Secondary | ICD-10-CM | POA: Diagnosis not present

## 2019-11-18 DIAGNOSIS — R41841 Cognitive communication deficit: Secondary | ICD-10-CM | POA: Diagnosis not present

## 2019-11-18 DIAGNOSIS — M6389 Disorders of muscle in diseases classified elsewhere, multiple sites: Secondary | ICD-10-CM | POA: Diagnosis not present

## 2019-11-18 DIAGNOSIS — R2681 Unsteadiness on feet: Secondary | ICD-10-CM | POA: Diagnosis not present

## 2019-11-19 DIAGNOSIS — G40909 Epilepsy, unspecified, not intractable, without status epilepticus: Secondary | ICD-10-CM | POA: Diagnosis not present

## 2019-11-19 DIAGNOSIS — M6389 Disorders of muscle in diseases classified elsewhere, multiple sites: Secondary | ICD-10-CM | POA: Diagnosis not present

## 2019-11-19 DIAGNOSIS — R41841 Cognitive communication deficit: Secondary | ICD-10-CM | POA: Diagnosis not present

## 2019-11-19 DIAGNOSIS — M6281 Muscle weakness (generalized): Secondary | ICD-10-CM | POA: Diagnosis not present

## 2019-11-19 DIAGNOSIS — R2681 Unsteadiness on feet: Secondary | ICD-10-CM | POA: Diagnosis not present

## 2019-11-19 DIAGNOSIS — F0390 Unspecified dementia without behavioral disturbance: Secondary | ICD-10-CM | POA: Diagnosis not present

## 2019-11-20 DIAGNOSIS — F0390 Unspecified dementia without behavioral disturbance: Secondary | ICD-10-CM | POA: Diagnosis not present

## 2019-11-20 DIAGNOSIS — R2681 Unsteadiness on feet: Secondary | ICD-10-CM | POA: Diagnosis not present

## 2019-11-20 DIAGNOSIS — M6281 Muscle weakness (generalized): Secondary | ICD-10-CM | POA: Diagnosis not present

## 2019-11-20 DIAGNOSIS — G40909 Epilepsy, unspecified, not intractable, without status epilepticus: Secondary | ICD-10-CM | POA: Diagnosis not present

## 2019-11-20 DIAGNOSIS — R41841 Cognitive communication deficit: Secondary | ICD-10-CM | POA: Diagnosis not present

## 2019-11-20 DIAGNOSIS — M6389 Disorders of muscle in diseases classified elsewhere, multiple sites: Secondary | ICD-10-CM | POA: Diagnosis not present

## 2019-11-21 DIAGNOSIS — R41841 Cognitive communication deficit: Secondary | ICD-10-CM | POA: Diagnosis not present

## 2019-11-21 DIAGNOSIS — F0390 Unspecified dementia without behavioral disturbance: Secondary | ICD-10-CM | POA: Diagnosis not present

## 2019-11-21 DIAGNOSIS — G40909 Epilepsy, unspecified, not intractable, without status epilepticus: Secondary | ICD-10-CM | POA: Diagnosis not present

## 2019-11-21 DIAGNOSIS — R2681 Unsteadiness on feet: Secondary | ICD-10-CM | POA: Diagnosis not present

## 2019-11-21 DIAGNOSIS — M6281 Muscle weakness (generalized): Secondary | ICD-10-CM | POA: Diagnosis not present

## 2019-11-21 DIAGNOSIS — M6389 Disorders of muscle in diseases classified elsewhere, multiple sites: Secondary | ICD-10-CM | POA: Diagnosis not present

## 2019-11-22 DIAGNOSIS — M6281 Muscle weakness (generalized): Secondary | ICD-10-CM | POA: Diagnosis not present

## 2019-11-22 DIAGNOSIS — R2681 Unsteadiness on feet: Secondary | ICD-10-CM | POA: Diagnosis not present

## 2019-11-22 DIAGNOSIS — R41841 Cognitive communication deficit: Secondary | ICD-10-CM | POA: Diagnosis not present

## 2019-11-22 DIAGNOSIS — M6389 Disorders of muscle in diseases classified elsewhere, multiple sites: Secondary | ICD-10-CM | POA: Diagnosis not present

## 2019-11-22 DIAGNOSIS — G40909 Epilepsy, unspecified, not intractable, without status epilepticus: Secondary | ICD-10-CM | POA: Diagnosis not present

## 2019-11-22 DIAGNOSIS — F0390 Unspecified dementia without behavioral disturbance: Secondary | ICD-10-CM | POA: Diagnosis not present

## 2019-11-23 DIAGNOSIS — R2681 Unsteadiness on feet: Secondary | ICD-10-CM | POA: Diagnosis not present

## 2019-11-23 DIAGNOSIS — G40909 Epilepsy, unspecified, not intractable, without status epilepticus: Secondary | ICD-10-CM | POA: Diagnosis not present

## 2019-11-23 DIAGNOSIS — M6389 Disorders of muscle in diseases classified elsewhere, multiple sites: Secondary | ICD-10-CM | POA: Diagnosis not present

## 2019-11-23 DIAGNOSIS — M6281 Muscle weakness (generalized): Secondary | ICD-10-CM | POA: Diagnosis not present

## 2019-11-23 DIAGNOSIS — R41841 Cognitive communication deficit: Secondary | ICD-10-CM | POA: Diagnosis not present

## 2019-11-23 DIAGNOSIS — F0390 Unspecified dementia without behavioral disturbance: Secondary | ICD-10-CM | POA: Diagnosis not present

## 2019-11-25 DIAGNOSIS — F0390 Unspecified dementia without behavioral disturbance: Secondary | ICD-10-CM | POA: Diagnosis not present

## 2019-11-25 DIAGNOSIS — G40909 Epilepsy, unspecified, not intractable, without status epilepticus: Secondary | ICD-10-CM | POA: Diagnosis not present

## 2019-11-25 DIAGNOSIS — M6389 Disorders of muscle in diseases classified elsewhere, multiple sites: Secondary | ICD-10-CM | POA: Diagnosis not present

## 2019-11-25 DIAGNOSIS — R2681 Unsteadiness on feet: Secondary | ICD-10-CM | POA: Diagnosis not present

## 2019-11-25 DIAGNOSIS — M6281 Muscle weakness (generalized): Secondary | ICD-10-CM | POA: Diagnosis not present

## 2019-11-25 DIAGNOSIS — R41841 Cognitive communication deficit: Secondary | ICD-10-CM | POA: Diagnosis not present

## 2019-11-26 DIAGNOSIS — M6389 Disorders of muscle in diseases classified elsewhere, multiple sites: Secondary | ICD-10-CM | POA: Diagnosis not present

## 2019-11-26 DIAGNOSIS — G40909 Epilepsy, unspecified, not intractable, without status epilepticus: Secondary | ICD-10-CM | POA: Diagnosis not present

## 2019-11-26 DIAGNOSIS — F0390 Unspecified dementia without behavioral disturbance: Secondary | ICD-10-CM | POA: Diagnosis not present

## 2019-11-26 DIAGNOSIS — R2681 Unsteadiness on feet: Secondary | ICD-10-CM | POA: Diagnosis not present

## 2019-11-26 DIAGNOSIS — R41841 Cognitive communication deficit: Secondary | ICD-10-CM | POA: Diagnosis not present

## 2019-11-26 DIAGNOSIS — M6281 Muscle weakness (generalized): Secondary | ICD-10-CM | POA: Diagnosis not present

## 2019-11-27 DIAGNOSIS — F0390 Unspecified dementia without behavioral disturbance: Secondary | ICD-10-CM | POA: Diagnosis not present

## 2019-11-27 DIAGNOSIS — R41841 Cognitive communication deficit: Secondary | ICD-10-CM | POA: Diagnosis not present

## 2019-11-27 DIAGNOSIS — M6389 Disorders of muscle in diseases classified elsewhere, multiple sites: Secondary | ICD-10-CM | POA: Diagnosis not present

## 2019-11-27 DIAGNOSIS — R2681 Unsteadiness on feet: Secondary | ICD-10-CM | POA: Diagnosis not present

## 2019-11-27 DIAGNOSIS — G40909 Epilepsy, unspecified, not intractable, without status epilepticus: Secondary | ICD-10-CM | POA: Diagnosis not present

## 2019-11-27 DIAGNOSIS — M6281 Muscle weakness (generalized): Secondary | ICD-10-CM | POA: Diagnosis not present

## 2019-11-28 DIAGNOSIS — F0390 Unspecified dementia without behavioral disturbance: Secondary | ICD-10-CM | POA: Diagnosis not present

## 2019-11-28 DIAGNOSIS — M6389 Disorders of muscle in diseases classified elsewhere, multiple sites: Secondary | ICD-10-CM | POA: Diagnosis not present

## 2019-11-28 DIAGNOSIS — G40909 Epilepsy, unspecified, not intractable, without status epilepticus: Secondary | ICD-10-CM | POA: Diagnosis not present

## 2019-11-28 DIAGNOSIS — R41841 Cognitive communication deficit: Secondary | ICD-10-CM | POA: Diagnosis not present

## 2019-11-28 DIAGNOSIS — R2681 Unsteadiness on feet: Secondary | ICD-10-CM | POA: Diagnosis not present

## 2019-11-28 DIAGNOSIS — M6281 Muscle weakness (generalized): Secondary | ICD-10-CM | POA: Diagnosis not present

## 2019-11-29 DIAGNOSIS — F0390 Unspecified dementia without behavioral disturbance: Secondary | ICD-10-CM | POA: Diagnosis not present

## 2019-11-29 DIAGNOSIS — G40909 Epilepsy, unspecified, not intractable, without status epilepticus: Secondary | ICD-10-CM | POA: Diagnosis not present

## 2019-11-29 DIAGNOSIS — M6281 Muscle weakness (generalized): Secondary | ICD-10-CM | POA: Diagnosis not present

## 2019-11-29 DIAGNOSIS — M6389 Disorders of muscle in diseases classified elsewhere, multiple sites: Secondary | ICD-10-CM | POA: Diagnosis not present

## 2019-11-29 DIAGNOSIS — G301 Alzheimer's disease with late onset: Secondary | ICD-10-CM | POA: Diagnosis not present

## 2019-11-29 DIAGNOSIS — R2681 Unsteadiness on feet: Secondary | ICD-10-CM | POA: Diagnosis not present

## 2019-11-29 DIAGNOSIS — F321 Major depressive disorder, single episode, moderate: Secondary | ICD-10-CM | POA: Diagnosis not present

## 2019-11-29 DIAGNOSIS — R41841 Cognitive communication deficit: Secondary | ICD-10-CM | POA: Diagnosis not present

## 2019-11-29 DIAGNOSIS — F5105 Insomnia due to other mental disorder: Secondary | ICD-10-CM | POA: Diagnosis not present

## 2019-12-02 DIAGNOSIS — F0390 Unspecified dementia without behavioral disturbance: Secondary | ICD-10-CM | POA: Diagnosis not present

## 2019-12-02 DIAGNOSIS — M6281 Muscle weakness (generalized): Secondary | ICD-10-CM | POA: Diagnosis not present

## 2019-12-02 DIAGNOSIS — G40909 Epilepsy, unspecified, not intractable, without status epilepticus: Secondary | ICD-10-CM | POA: Diagnosis not present

## 2019-12-02 DIAGNOSIS — M6389 Disorders of muscle in diseases classified elsewhere, multiple sites: Secondary | ICD-10-CM | POA: Diagnosis not present

## 2019-12-02 DIAGNOSIS — R2681 Unsteadiness on feet: Secondary | ICD-10-CM | POA: Diagnosis not present

## 2019-12-02 DIAGNOSIS — R41841 Cognitive communication deficit: Secondary | ICD-10-CM | POA: Diagnosis not present

## 2019-12-03 DIAGNOSIS — F0390 Unspecified dementia without behavioral disturbance: Secondary | ICD-10-CM | POA: Diagnosis not present

## 2019-12-03 DIAGNOSIS — G40909 Epilepsy, unspecified, not intractable, without status epilepticus: Secondary | ICD-10-CM | POA: Diagnosis not present

## 2019-12-03 DIAGNOSIS — R2681 Unsteadiness on feet: Secondary | ICD-10-CM | POA: Diagnosis not present

## 2019-12-03 DIAGNOSIS — M6281 Muscle weakness (generalized): Secondary | ICD-10-CM | POA: Diagnosis not present

## 2019-12-03 DIAGNOSIS — R41841 Cognitive communication deficit: Secondary | ICD-10-CM | POA: Diagnosis not present

## 2019-12-03 DIAGNOSIS — M6389 Disorders of muscle in diseases classified elsewhere, multiple sites: Secondary | ICD-10-CM | POA: Diagnosis not present

## 2019-12-04 DIAGNOSIS — M6281 Muscle weakness (generalized): Secondary | ICD-10-CM | POA: Diagnosis not present

## 2019-12-04 DIAGNOSIS — R41841 Cognitive communication deficit: Secondary | ICD-10-CM | POA: Diagnosis not present

## 2019-12-04 DIAGNOSIS — F0390 Unspecified dementia without behavioral disturbance: Secondary | ICD-10-CM | POA: Diagnosis not present

## 2019-12-04 DIAGNOSIS — R2681 Unsteadiness on feet: Secondary | ICD-10-CM | POA: Diagnosis not present

## 2019-12-04 DIAGNOSIS — G40909 Epilepsy, unspecified, not intractable, without status epilepticus: Secondary | ICD-10-CM | POA: Diagnosis not present

## 2019-12-04 DIAGNOSIS — M6389 Disorders of muscle in diseases classified elsewhere, multiple sites: Secondary | ICD-10-CM | POA: Diagnosis not present

## 2019-12-05 DIAGNOSIS — R2681 Unsteadiness on feet: Secondary | ICD-10-CM | POA: Diagnosis not present

## 2019-12-05 DIAGNOSIS — M6281 Muscle weakness (generalized): Secondary | ICD-10-CM | POA: Diagnosis not present

## 2019-12-05 DIAGNOSIS — F0390 Unspecified dementia without behavioral disturbance: Secondary | ICD-10-CM | POA: Diagnosis not present

## 2019-12-05 DIAGNOSIS — M6389 Disorders of muscle in diseases classified elsewhere, multiple sites: Secondary | ICD-10-CM | POA: Diagnosis not present

## 2019-12-05 DIAGNOSIS — R41841 Cognitive communication deficit: Secondary | ICD-10-CM | POA: Diagnosis not present

## 2019-12-05 DIAGNOSIS — G40909 Epilepsy, unspecified, not intractable, without status epilepticus: Secondary | ICD-10-CM | POA: Diagnosis not present

## 2019-12-06 DIAGNOSIS — R41841 Cognitive communication deficit: Secondary | ICD-10-CM | POA: Diagnosis not present

## 2019-12-06 DIAGNOSIS — M6281 Muscle weakness (generalized): Secondary | ICD-10-CM | POA: Diagnosis not present

## 2019-12-06 DIAGNOSIS — M6389 Disorders of muscle in diseases classified elsewhere, multiple sites: Secondary | ICD-10-CM | POA: Diagnosis not present

## 2019-12-06 DIAGNOSIS — G40909 Epilepsy, unspecified, not intractable, without status epilepticus: Secondary | ICD-10-CM | POA: Diagnosis not present

## 2019-12-06 DIAGNOSIS — R2681 Unsteadiness on feet: Secondary | ICD-10-CM | POA: Diagnosis not present

## 2019-12-06 DIAGNOSIS — F0390 Unspecified dementia without behavioral disturbance: Secondary | ICD-10-CM | POA: Diagnosis not present

## 2019-12-07 DIAGNOSIS — R41841 Cognitive communication deficit: Secondary | ICD-10-CM | POA: Diagnosis not present

## 2019-12-07 DIAGNOSIS — F0390 Unspecified dementia without behavioral disturbance: Secondary | ICD-10-CM | POA: Diagnosis not present

## 2019-12-07 DIAGNOSIS — M6281 Muscle weakness (generalized): Secondary | ICD-10-CM | POA: Diagnosis not present

## 2019-12-07 DIAGNOSIS — R2681 Unsteadiness on feet: Secondary | ICD-10-CM | POA: Diagnosis not present

## 2019-12-07 DIAGNOSIS — G40909 Epilepsy, unspecified, not intractable, without status epilepticus: Secondary | ICD-10-CM | POA: Diagnosis not present

## 2019-12-07 DIAGNOSIS — M6389 Disorders of muscle in diseases classified elsewhere, multiple sites: Secondary | ICD-10-CM | POA: Diagnosis not present

## 2019-12-09 DIAGNOSIS — M6281 Muscle weakness (generalized): Secondary | ICD-10-CM | POA: Diagnosis not present

## 2019-12-09 DIAGNOSIS — M6389 Disorders of muscle in diseases classified elsewhere, multiple sites: Secondary | ICD-10-CM | POA: Diagnosis not present

## 2019-12-09 DIAGNOSIS — R41841 Cognitive communication deficit: Secondary | ICD-10-CM | POA: Diagnosis not present

## 2019-12-09 DIAGNOSIS — G40909 Epilepsy, unspecified, not intractable, without status epilepticus: Secondary | ICD-10-CM | POA: Diagnosis not present

## 2019-12-09 DIAGNOSIS — R2681 Unsteadiness on feet: Secondary | ICD-10-CM | POA: Diagnosis not present

## 2019-12-09 DIAGNOSIS — F0390 Unspecified dementia without behavioral disturbance: Secondary | ICD-10-CM | POA: Diagnosis not present

## 2019-12-10 DIAGNOSIS — M6281 Muscle weakness (generalized): Secondary | ICD-10-CM | POA: Diagnosis not present

## 2019-12-10 DIAGNOSIS — R269 Unspecified abnormalities of gait and mobility: Secondary | ICD-10-CM | POA: Diagnosis not present

## 2019-12-10 DIAGNOSIS — G40909 Epilepsy, unspecified, not intractable, without status epilepticus: Secondary | ICD-10-CM | POA: Diagnosis not present

## 2019-12-10 DIAGNOSIS — F32A Depression, unspecified: Secondary | ICD-10-CM | POA: Diagnosis not present

## 2019-12-10 DIAGNOSIS — R2681 Unsteadiness on feet: Secondary | ICD-10-CM | POA: Diagnosis not present

## 2019-12-10 DIAGNOSIS — F0390 Unspecified dementia without behavioral disturbance: Secondary | ICD-10-CM | POA: Diagnosis not present

## 2019-12-10 DIAGNOSIS — G9389 Other specified disorders of brain: Secondary | ICD-10-CM | POA: Diagnosis not present

## 2019-12-10 DIAGNOSIS — I10 Essential (primary) hypertension: Secondary | ICD-10-CM | POA: Diagnosis not present

## 2019-12-10 DIAGNOSIS — B91 Sequelae of poliomyelitis: Secondary | ICD-10-CM | POA: Diagnosis not present

## 2019-12-10 DIAGNOSIS — R41841 Cognitive communication deficit: Secondary | ICD-10-CM | POA: Diagnosis not present

## 2019-12-10 DIAGNOSIS — G4733 Obstructive sleep apnea (adult) (pediatric): Secondary | ICD-10-CM | POA: Diagnosis not present

## 2019-12-10 DIAGNOSIS — M6389 Disorders of muscle in diseases classified elsewhere, multiple sites: Secondary | ICD-10-CM | POA: Diagnosis not present

## 2019-12-10 DIAGNOSIS — R569 Unspecified convulsions: Secondary | ICD-10-CM | POA: Diagnosis not present

## 2019-12-11 DIAGNOSIS — M6389 Disorders of muscle in diseases classified elsewhere, multiple sites: Secondary | ICD-10-CM | POA: Diagnosis not present

## 2019-12-11 DIAGNOSIS — R2681 Unsteadiness on feet: Secondary | ICD-10-CM | POA: Diagnosis not present

## 2019-12-11 DIAGNOSIS — G40909 Epilepsy, unspecified, not intractable, without status epilepticus: Secondary | ICD-10-CM | POA: Diagnosis not present

## 2019-12-11 DIAGNOSIS — R41841 Cognitive communication deficit: Secondary | ICD-10-CM | POA: Diagnosis not present

## 2019-12-11 DIAGNOSIS — M6281 Muscle weakness (generalized): Secondary | ICD-10-CM | POA: Diagnosis not present

## 2019-12-11 DIAGNOSIS — F0390 Unspecified dementia without behavioral disturbance: Secondary | ICD-10-CM | POA: Diagnosis not present

## 2019-12-12 DIAGNOSIS — M6389 Disorders of muscle in diseases classified elsewhere, multiple sites: Secondary | ICD-10-CM | POA: Diagnosis not present

## 2019-12-12 DIAGNOSIS — G40909 Epilepsy, unspecified, not intractable, without status epilepticus: Secondary | ICD-10-CM | POA: Diagnosis not present

## 2019-12-12 DIAGNOSIS — R2681 Unsteadiness on feet: Secondary | ICD-10-CM | POA: Diagnosis not present

## 2019-12-12 DIAGNOSIS — F0390 Unspecified dementia without behavioral disturbance: Secondary | ICD-10-CM | POA: Diagnosis not present

## 2019-12-12 DIAGNOSIS — R41841 Cognitive communication deficit: Secondary | ICD-10-CM | POA: Diagnosis not present

## 2019-12-12 DIAGNOSIS — M6281 Muscle weakness (generalized): Secondary | ICD-10-CM | POA: Diagnosis not present

## 2019-12-13 DIAGNOSIS — G40909 Epilepsy, unspecified, not intractable, without status epilepticus: Secondary | ICD-10-CM | POA: Diagnosis not present

## 2019-12-13 DIAGNOSIS — F0390 Unspecified dementia without behavioral disturbance: Secondary | ICD-10-CM | POA: Diagnosis not present

## 2019-12-13 DIAGNOSIS — M6389 Disorders of muscle in diseases classified elsewhere, multiple sites: Secondary | ICD-10-CM | POA: Diagnosis not present

## 2019-12-13 DIAGNOSIS — R41841 Cognitive communication deficit: Secondary | ICD-10-CM | POA: Diagnosis not present

## 2019-12-13 DIAGNOSIS — R2681 Unsteadiness on feet: Secondary | ICD-10-CM | POA: Diagnosis not present

## 2019-12-13 DIAGNOSIS — M6281 Muscle weakness (generalized): Secondary | ICD-10-CM | POA: Diagnosis not present

## 2019-12-16 DIAGNOSIS — R2681 Unsteadiness on feet: Secondary | ICD-10-CM | POA: Diagnosis not present

## 2019-12-16 DIAGNOSIS — M6389 Disorders of muscle in diseases classified elsewhere, multiple sites: Secondary | ICD-10-CM | POA: Diagnosis not present

## 2019-12-16 DIAGNOSIS — G40909 Epilepsy, unspecified, not intractable, without status epilepticus: Secondary | ICD-10-CM | POA: Diagnosis not present

## 2019-12-16 DIAGNOSIS — R41841 Cognitive communication deficit: Secondary | ICD-10-CM | POA: Diagnosis not present

## 2019-12-16 DIAGNOSIS — F0390 Unspecified dementia without behavioral disturbance: Secondary | ICD-10-CM | POA: Diagnosis not present

## 2019-12-16 DIAGNOSIS — M6281 Muscle weakness (generalized): Secondary | ICD-10-CM | POA: Diagnosis not present

## 2019-12-17 DIAGNOSIS — F0390 Unspecified dementia without behavioral disturbance: Secondary | ICD-10-CM | POA: Diagnosis not present

## 2019-12-17 DIAGNOSIS — R41841 Cognitive communication deficit: Secondary | ICD-10-CM | POA: Diagnosis not present

## 2019-12-17 DIAGNOSIS — M6389 Disorders of muscle in diseases classified elsewhere, multiple sites: Secondary | ICD-10-CM | POA: Diagnosis not present

## 2019-12-17 DIAGNOSIS — M6281 Muscle weakness (generalized): Secondary | ICD-10-CM | POA: Diagnosis not present

## 2019-12-17 DIAGNOSIS — G40909 Epilepsy, unspecified, not intractable, without status epilepticus: Secondary | ICD-10-CM | POA: Diagnosis not present

## 2019-12-17 DIAGNOSIS — R2681 Unsteadiness on feet: Secondary | ICD-10-CM | POA: Diagnosis not present

## 2019-12-18 DIAGNOSIS — M6281 Muscle weakness (generalized): Secondary | ICD-10-CM | POA: Diagnosis not present

## 2019-12-18 DIAGNOSIS — G40909 Epilepsy, unspecified, not intractable, without status epilepticus: Secondary | ICD-10-CM | POA: Diagnosis not present

## 2019-12-18 DIAGNOSIS — M6389 Disorders of muscle in diseases classified elsewhere, multiple sites: Secondary | ICD-10-CM | POA: Diagnosis not present

## 2019-12-18 DIAGNOSIS — R41841 Cognitive communication deficit: Secondary | ICD-10-CM | POA: Diagnosis not present

## 2019-12-18 DIAGNOSIS — R2681 Unsteadiness on feet: Secondary | ICD-10-CM | POA: Diagnosis not present

## 2019-12-18 DIAGNOSIS — F0390 Unspecified dementia without behavioral disturbance: Secondary | ICD-10-CM | POA: Diagnosis not present

## 2019-12-18 DIAGNOSIS — R296 Repeated falls: Secondary | ICD-10-CM | POA: Diagnosis not present

## 2019-12-19 DIAGNOSIS — R41841 Cognitive communication deficit: Secondary | ICD-10-CM | POA: Diagnosis not present

## 2019-12-19 DIAGNOSIS — M6281 Muscle weakness (generalized): Secondary | ICD-10-CM | POA: Diagnosis not present

## 2019-12-19 DIAGNOSIS — R2681 Unsteadiness on feet: Secondary | ICD-10-CM | POA: Diagnosis not present

## 2019-12-19 DIAGNOSIS — F0390 Unspecified dementia without behavioral disturbance: Secondary | ICD-10-CM | POA: Diagnosis not present

## 2019-12-19 DIAGNOSIS — M6389 Disorders of muscle in diseases classified elsewhere, multiple sites: Secondary | ICD-10-CM | POA: Diagnosis not present

## 2019-12-19 DIAGNOSIS — G40909 Epilepsy, unspecified, not intractable, without status epilepticus: Secondary | ICD-10-CM | POA: Diagnosis not present

## 2019-12-20 DIAGNOSIS — M6281 Muscle weakness (generalized): Secondary | ICD-10-CM | POA: Diagnosis not present

## 2019-12-20 DIAGNOSIS — M6389 Disorders of muscle in diseases classified elsewhere, multiple sites: Secondary | ICD-10-CM | POA: Diagnosis not present

## 2019-12-20 DIAGNOSIS — F0390 Unspecified dementia without behavioral disturbance: Secondary | ICD-10-CM | POA: Diagnosis not present

## 2019-12-20 DIAGNOSIS — G40909 Epilepsy, unspecified, not intractable, without status epilepticus: Secondary | ICD-10-CM | POA: Diagnosis not present

## 2019-12-20 DIAGNOSIS — R41841 Cognitive communication deficit: Secondary | ICD-10-CM | POA: Diagnosis not present

## 2019-12-20 DIAGNOSIS — R2681 Unsteadiness on feet: Secondary | ICD-10-CM | POA: Diagnosis not present

## 2019-12-21 DIAGNOSIS — M6389 Disorders of muscle in diseases classified elsewhere, multiple sites: Secondary | ICD-10-CM | POA: Diagnosis not present

## 2019-12-21 DIAGNOSIS — M6281 Muscle weakness (generalized): Secondary | ICD-10-CM | POA: Diagnosis not present

## 2019-12-21 DIAGNOSIS — R2681 Unsteadiness on feet: Secondary | ICD-10-CM | POA: Diagnosis not present

## 2019-12-21 DIAGNOSIS — G40909 Epilepsy, unspecified, not intractable, without status epilepticus: Secondary | ICD-10-CM | POA: Diagnosis not present

## 2019-12-21 DIAGNOSIS — F0390 Unspecified dementia without behavioral disturbance: Secondary | ICD-10-CM | POA: Diagnosis not present

## 2019-12-21 DIAGNOSIS — R41841 Cognitive communication deficit: Secondary | ICD-10-CM | POA: Diagnosis not present

## 2019-12-23 DIAGNOSIS — M6389 Disorders of muscle in diseases classified elsewhere, multiple sites: Secondary | ICD-10-CM | POA: Diagnosis not present

## 2019-12-23 DIAGNOSIS — R41841 Cognitive communication deficit: Secondary | ICD-10-CM | POA: Diagnosis not present

## 2019-12-23 DIAGNOSIS — F0390 Unspecified dementia without behavioral disturbance: Secondary | ICD-10-CM | POA: Diagnosis not present

## 2019-12-23 DIAGNOSIS — G40909 Epilepsy, unspecified, not intractable, without status epilepticus: Secondary | ICD-10-CM | POA: Diagnosis not present

## 2019-12-23 DIAGNOSIS — R2681 Unsteadiness on feet: Secondary | ICD-10-CM | POA: Diagnosis not present

## 2019-12-23 DIAGNOSIS — M6281 Muscle weakness (generalized): Secondary | ICD-10-CM | POA: Diagnosis not present

## 2019-12-24 DIAGNOSIS — M6389 Disorders of muscle in diseases classified elsewhere, multiple sites: Secondary | ICD-10-CM | POA: Diagnosis not present

## 2019-12-24 DIAGNOSIS — M6281 Muscle weakness (generalized): Secondary | ICD-10-CM | POA: Diagnosis not present

## 2019-12-24 DIAGNOSIS — G40909 Epilepsy, unspecified, not intractable, without status epilepticus: Secondary | ICD-10-CM | POA: Diagnosis not present

## 2019-12-24 DIAGNOSIS — R41841 Cognitive communication deficit: Secondary | ICD-10-CM | POA: Diagnosis not present

## 2019-12-24 DIAGNOSIS — F0390 Unspecified dementia without behavioral disturbance: Secondary | ICD-10-CM | POA: Diagnosis not present

## 2019-12-24 DIAGNOSIS — R2681 Unsteadiness on feet: Secondary | ICD-10-CM | POA: Diagnosis not present

## 2019-12-25 DIAGNOSIS — M6281 Muscle weakness (generalized): Secondary | ICD-10-CM | POA: Diagnosis not present

## 2019-12-25 DIAGNOSIS — R41841 Cognitive communication deficit: Secondary | ICD-10-CM | POA: Diagnosis not present

## 2019-12-25 DIAGNOSIS — G40909 Epilepsy, unspecified, not intractable, without status epilepticus: Secondary | ICD-10-CM | POA: Diagnosis not present

## 2019-12-25 DIAGNOSIS — F0390 Unspecified dementia without behavioral disturbance: Secondary | ICD-10-CM | POA: Diagnosis not present

## 2019-12-25 DIAGNOSIS — M6389 Disorders of muscle in diseases classified elsewhere, multiple sites: Secondary | ICD-10-CM | POA: Diagnosis not present

## 2019-12-25 DIAGNOSIS — R2681 Unsteadiness on feet: Secondary | ICD-10-CM | POA: Diagnosis not present

## 2019-12-26 DIAGNOSIS — G40909 Epilepsy, unspecified, not intractable, without status epilepticus: Secondary | ICD-10-CM | POA: Diagnosis not present

## 2019-12-26 DIAGNOSIS — M6389 Disorders of muscle in diseases classified elsewhere, multiple sites: Secondary | ICD-10-CM | POA: Diagnosis not present

## 2019-12-26 DIAGNOSIS — F0390 Unspecified dementia without behavioral disturbance: Secondary | ICD-10-CM | POA: Diagnosis not present

## 2019-12-26 DIAGNOSIS — R2681 Unsteadiness on feet: Secondary | ICD-10-CM | POA: Diagnosis not present

## 2019-12-26 DIAGNOSIS — R41841 Cognitive communication deficit: Secondary | ICD-10-CM | POA: Diagnosis not present

## 2019-12-26 DIAGNOSIS — M6281 Muscle weakness (generalized): Secondary | ICD-10-CM | POA: Diagnosis not present

## 2019-12-27 DIAGNOSIS — R2681 Unsteadiness on feet: Secondary | ICD-10-CM | POA: Diagnosis not present

## 2019-12-27 DIAGNOSIS — G40909 Epilepsy, unspecified, not intractable, without status epilepticus: Secondary | ICD-10-CM | POA: Diagnosis not present

## 2019-12-27 DIAGNOSIS — M6389 Disorders of muscle in diseases classified elsewhere, multiple sites: Secondary | ICD-10-CM | POA: Diagnosis not present

## 2019-12-27 DIAGNOSIS — F5101 Primary insomnia: Secondary | ICD-10-CM | POA: Diagnosis not present

## 2019-12-27 DIAGNOSIS — F0390 Unspecified dementia without behavioral disturbance: Secondary | ICD-10-CM | POA: Diagnosis not present

## 2019-12-27 DIAGNOSIS — M6281 Muscle weakness (generalized): Secondary | ICD-10-CM | POA: Diagnosis not present

## 2019-12-27 DIAGNOSIS — F321 Major depressive disorder, single episode, moderate: Secondary | ICD-10-CM | POA: Diagnosis not present

## 2019-12-27 DIAGNOSIS — R41841 Cognitive communication deficit: Secondary | ICD-10-CM | POA: Diagnosis not present

## 2019-12-27 DIAGNOSIS — G301 Alzheimer's disease with late onset: Secondary | ICD-10-CM | POA: Diagnosis not present

## 2019-12-30 DIAGNOSIS — E039 Hypothyroidism, unspecified: Secondary | ICD-10-CM | POA: Diagnosis not present

## 2019-12-30 DIAGNOSIS — K219 Gastro-esophageal reflux disease without esophagitis: Secondary | ICD-10-CM | POA: Diagnosis not present

## 2019-12-30 DIAGNOSIS — D509 Iron deficiency anemia, unspecified: Secondary | ICD-10-CM | POA: Diagnosis not present

## 2019-12-30 DIAGNOSIS — R41841 Cognitive communication deficit: Secondary | ICD-10-CM | POA: Diagnosis not present

## 2019-12-30 DIAGNOSIS — G40909 Epilepsy, unspecified, not intractable, without status epilepticus: Secondary | ICD-10-CM | POA: Diagnosis not present

## 2019-12-30 DIAGNOSIS — F329 Major depressive disorder, single episode, unspecified: Secondary | ICD-10-CM | POA: Diagnosis not present

## 2019-12-30 DIAGNOSIS — R2681 Unsteadiness on feet: Secondary | ICD-10-CM | POA: Diagnosis not present

## 2019-12-30 DIAGNOSIS — F0390 Unspecified dementia without behavioral disturbance: Secondary | ICD-10-CM | POA: Diagnosis not present

## 2019-12-30 DIAGNOSIS — I10 Essential (primary) hypertension: Secondary | ICD-10-CM | POA: Diagnosis not present

## 2019-12-30 DIAGNOSIS — E781 Pure hyperglyceridemia: Secondary | ICD-10-CM | POA: Diagnosis not present

## 2019-12-30 DIAGNOSIS — M6281 Muscle weakness (generalized): Secondary | ICD-10-CM | POA: Diagnosis not present

## 2019-12-30 DIAGNOSIS — M6389 Disorders of muscle in diseases classified elsewhere, multiple sites: Secondary | ICD-10-CM | POA: Diagnosis not present

## 2019-12-31 DIAGNOSIS — F0390 Unspecified dementia without behavioral disturbance: Secondary | ICD-10-CM | POA: Diagnosis not present

## 2019-12-31 DIAGNOSIS — M6389 Disorders of muscle in diseases classified elsewhere, multiple sites: Secondary | ICD-10-CM | POA: Diagnosis not present

## 2019-12-31 DIAGNOSIS — R2681 Unsteadiness on feet: Secondary | ICD-10-CM | POA: Diagnosis not present

## 2019-12-31 DIAGNOSIS — G40909 Epilepsy, unspecified, not intractable, without status epilepticus: Secondary | ICD-10-CM | POA: Diagnosis not present

## 2019-12-31 DIAGNOSIS — R41841 Cognitive communication deficit: Secondary | ICD-10-CM | POA: Diagnosis not present

## 2019-12-31 DIAGNOSIS — M6281 Muscle weakness (generalized): Secondary | ICD-10-CM | POA: Diagnosis not present

## 2020-01-01 DIAGNOSIS — M6389 Disorders of muscle in diseases classified elsewhere, multiple sites: Secondary | ICD-10-CM | POA: Diagnosis not present

## 2020-01-01 DIAGNOSIS — M6281 Muscle weakness (generalized): Secondary | ICD-10-CM | POA: Diagnosis not present

## 2020-01-01 DIAGNOSIS — G40909 Epilepsy, unspecified, not intractable, without status epilepticus: Secondary | ICD-10-CM | POA: Diagnosis not present

## 2020-01-01 DIAGNOSIS — R41841 Cognitive communication deficit: Secondary | ICD-10-CM | POA: Diagnosis not present

## 2020-01-01 DIAGNOSIS — F0390 Unspecified dementia without behavioral disturbance: Secondary | ICD-10-CM | POA: Diagnosis not present

## 2020-01-01 DIAGNOSIS — R2681 Unsteadiness on feet: Secondary | ICD-10-CM | POA: Diagnosis not present

## 2020-01-02 DIAGNOSIS — R2681 Unsteadiness on feet: Secondary | ICD-10-CM | POA: Diagnosis not present

## 2020-01-02 DIAGNOSIS — F0390 Unspecified dementia without behavioral disturbance: Secondary | ICD-10-CM | POA: Diagnosis not present

## 2020-01-02 DIAGNOSIS — R41841 Cognitive communication deficit: Secondary | ICD-10-CM | POA: Diagnosis not present

## 2020-01-02 DIAGNOSIS — M6389 Disorders of muscle in diseases classified elsewhere, multiple sites: Secondary | ICD-10-CM | POA: Diagnosis not present

## 2020-01-02 DIAGNOSIS — G40909 Epilepsy, unspecified, not intractable, without status epilepticus: Secondary | ICD-10-CM | POA: Diagnosis not present

## 2020-01-02 DIAGNOSIS — M6281 Muscle weakness (generalized): Secondary | ICD-10-CM | POA: Diagnosis not present

## 2020-01-03 DIAGNOSIS — F0390 Unspecified dementia without behavioral disturbance: Secondary | ICD-10-CM | POA: Diagnosis not present

## 2020-01-03 DIAGNOSIS — G40909 Epilepsy, unspecified, not intractable, without status epilepticus: Secondary | ICD-10-CM | POA: Diagnosis not present

## 2020-01-03 DIAGNOSIS — M6389 Disorders of muscle in diseases classified elsewhere, multiple sites: Secondary | ICD-10-CM | POA: Diagnosis not present

## 2020-01-03 DIAGNOSIS — M6281 Muscle weakness (generalized): Secondary | ICD-10-CM | POA: Diagnosis not present

## 2020-01-03 DIAGNOSIS — R41841 Cognitive communication deficit: Secondary | ICD-10-CM | POA: Diagnosis not present

## 2020-01-03 DIAGNOSIS — R2681 Unsteadiness on feet: Secondary | ICD-10-CM | POA: Diagnosis not present

## 2020-01-05 DIAGNOSIS — R41841 Cognitive communication deficit: Secondary | ICD-10-CM | POA: Diagnosis not present

## 2020-01-05 DIAGNOSIS — G40909 Epilepsy, unspecified, not intractable, without status epilepticus: Secondary | ICD-10-CM | POA: Diagnosis not present

## 2020-01-05 DIAGNOSIS — M6389 Disorders of muscle in diseases classified elsewhere, multiple sites: Secondary | ICD-10-CM | POA: Diagnosis not present

## 2020-01-05 DIAGNOSIS — M6281 Muscle weakness (generalized): Secondary | ICD-10-CM | POA: Diagnosis not present

## 2020-01-05 DIAGNOSIS — R2681 Unsteadiness on feet: Secondary | ICD-10-CM | POA: Diagnosis not present

## 2020-01-05 DIAGNOSIS — F0390 Unspecified dementia without behavioral disturbance: Secondary | ICD-10-CM | POA: Diagnosis not present

## 2020-01-06 DIAGNOSIS — F0390 Unspecified dementia without behavioral disturbance: Secondary | ICD-10-CM | POA: Diagnosis not present

## 2020-01-06 DIAGNOSIS — R2681 Unsteadiness on feet: Secondary | ICD-10-CM | POA: Diagnosis not present

## 2020-01-06 DIAGNOSIS — M6389 Disorders of muscle in diseases classified elsewhere, multiple sites: Secondary | ICD-10-CM | POA: Diagnosis not present

## 2020-01-06 DIAGNOSIS — M6281 Muscle weakness (generalized): Secondary | ICD-10-CM | POA: Diagnosis not present

## 2020-01-06 DIAGNOSIS — G40909 Epilepsy, unspecified, not intractable, without status epilepticus: Secondary | ICD-10-CM | POA: Diagnosis not present

## 2020-01-06 DIAGNOSIS — R41841 Cognitive communication deficit: Secondary | ICD-10-CM | POA: Diagnosis not present

## 2020-01-07 DIAGNOSIS — R2681 Unsteadiness on feet: Secondary | ICD-10-CM | POA: Diagnosis not present

## 2020-01-07 DIAGNOSIS — M6389 Disorders of muscle in diseases classified elsewhere, multiple sites: Secondary | ICD-10-CM | POA: Diagnosis not present

## 2020-01-07 DIAGNOSIS — R41841 Cognitive communication deficit: Secondary | ICD-10-CM | POA: Diagnosis not present

## 2020-01-07 DIAGNOSIS — F0390 Unspecified dementia without behavioral disturbance: Secondary | ICD-10-CM | POA: Diagnosis not present

## 2020-01-07 DIAGNOSIS — G40909 Epilepsy, unspecified, not intractable, without status epilepticus: Secondary | ICD-10-CM | POA: Diagnosis not present

## 2020-01-07 DIAGNOSIS — M6281 Muscle weakness (generalized): Secondary | ICD-10-CM | POA: Diagnosis not present

## 2020-01-08 DIAGNOSIS — G40909 Epilepsy, unspecified, not intractable, without status epilepticus: Secondary | ICD-10-CM | POA: Diagnosis not present

## 2020-01-08 DIAGNOSIS — R41841 Cognitive communication deficit: Secondary | ICD-10-CM | POA: Diagnosis not present

## 2020-01-08 DIAGNOSIS — F0390 Unspecified dementia without behavioral disturbance: Secondary | ICD-10-CM | POA: Diagnosis not present

## 2020-01-08 DIAGNOSIS — R2681 Unsteadiness on feet: Secondary | ICD-10-CM | POA: Diagnosis not present

## 2020-01-08 DIAGNOSIS — M6389 Disorders of muscle in diseases classified elsewhere, multiple sites: Secondary | ICD-10-CM | POA: Diagnosis not present

## 2020-01-08 DIAGNOSIS — M6281 Muscle weakness (generalized): Secondary | ICD-10-CM | POA: Diagnosis not present

## 2020-01-09 DIAGNOSIS — M6389 Disorders of muscle in diseases classified elsewhere, multiple sites: Secondary | ICD-10-CM | POA: Diagnosis not present

## 2020-01-09 DIAGNOSIS — M6281 Muscle weakness (generalized): Secondary | ICD-10-CM | POA: Diagnosis not present

## 2020-01-09 DIAGNOSIS — F0390 Unspecified dementia without behavioral disturbance: Secondary | ICD-10-CM | POA: Diagnosis not present

## 2020-01-09 DIAGNOSIS — R2681 Unsteadiness on feet: Secondary | ICD-10-CM | POA: Diagnosis not present

## 2020-01-09 DIAGNOSIS — G40909 Epilepsy, unspecified, not intractable, without status epilepticus: Secondary | ICD-10-CM | POA: Diagnosis not present

## 2020-01-09 DIAGNOSIS — R41841 Cognitive communication deficit: Secondary | ICD-10-CM | POA: Diagnosis not present

## 2020-01-13 DIAGNOSIS — M6281 Muscle weakness (generalized): Secondary | ICD-10-CM | POA: Diagnosis not present

## 2020-01-13 DIAGNOSIS — R41841 Cognitive communication deficit: Secondary | ICD-10-CM | POA: Diagnosis not present

## 2020-01-13 DIAGNOSIS — R2681 Unsteadiness on feet: Secondary | ICD-10-CM | POA: Diagnosis not present

## 2020-01-13 DIAGNOSIS — F0390 Unspecified dementia without behavioral disturbance: Secondary | ICD-10-CM | POA: Diagnosis not present

## 2020-01-13 DIAGNOSIS — G40909 Epilepsy, unspecified, not intractable, without status epilepticus: Secondary | ICD-10-CM | POA: Diagnosis not present

## 2020-01-13 DIAGNOSIS — M6389 Disorders of muscle in diseases classified elsewhere, multiple sites: Secondary | ICD-10-CM | POA: Diagnosis not present

## 2020-01-14 DIAGNOSIS — R2681 Unsteadiness on feet: Secondary | ICD-10-CM | POA: Diagnosis not present

## 2020-01-14 DIAGNOSIS — G40909 Epilepsy, unspecified, not intractable, without status epilepticus: Secondary | ICD-10-CM | POA: Diagnosis not present

## 2020-01-14 DIAGNOSIS — R41841 Cognitive communication deficit: Secondary | ICD-10-CM | POA: Diagnosis not present

## 2020-01-14 DIAGNOSIS — F0390 Unspecified dementia without behavioral disturbance: Secondary | ICD-10-CM | POA: Diagnosis not present

## 2020-01-14 DIAGNOSIS — M6281 Muscle weakness (generalized): Secondary | ICD-10-CM | POA: Diagnosis not present

## 2020-01-14 DIAGNOSIS — M6389 Disorders of muscle in diseases classified elsewhere, multiple sites: Secondary | ICD-10-CM | POA: Diagnosis not present

## 2020-01-15 DIAGNOSIS — R41841 Cognitive communication deficit: Secondary | ICD-10-CM | POA: Diagnosis not present

## 2020-01-15 DIAGNOSIS — M6389 Disorders of muscle in diseases classified elsewhere, multiple sites: Secondary | ICD-10-CM | POA: Diagnosis not present

## 2020-01-15 DIAGNOSIS — M6281 Muscle weakness (generalized): Secondary | ICD-10-CM | POA: Diagnosis not present

## 2020-01-15 DIAGNOSIS — G40909 Epilepsy, unspecified, not intractable, without status epilepticus: Secondary | ICD-10-CM | POA: Diagnosis not present

## 2020-01-15 DIAGNOSIS — F0390 Unspecified dementia without behavioral disturbance: Secondary | ICD-10-CM | POA: Diagnosis not present

## 2020-01-15 DIAGNOSIS — R2681 Unsteadiness on feet: Secondary | ICD-10-CM | POA: Diagnosis not present

## 2020-01-16 DIAGNOSIS — F0390 Unspecified dementia without behavioral disturbance: Secondary | ICD-10-CM | POA: Diagnosis not present

## 2020-01-16 DIAGNOSIS — M6389 Disorders of muscle in diseases classified elsewhere, multiple sites: Secondary | ICD-10-CM | POA: Diagnosis not present

## 2020-01-16 DIAGNOSIS — R2681 Unsteadiness on feet: Secondary | ICD-10-CM | POA: Diagnosis not present

## 2020-01-16 DIAGNOSIS — M6281 Muscle weakness (generalized): Secondary | ICD-10-CM | POA: Diagnosis not present

## 2020-01-16 DIAGNOSIS — R41841 Cognitive communication deficit: Secondary | ICD-10-CM | POA: Diagnosis not present

## 2020-01-16 DIAGNOSIS — G40909 Epilepsy, unspecified, not intractable, without status epilepticus: Secondary | ICD-10-CM | POA: Diagnosis not present

## 2020-01-17 DIAGNOSIS — M6281 Muscle weakness (generalized): Secondary | ICD-10-CM | POA: Diagnosis not present

## 2020-01-17 DIAGNOSIS — F0390 Unspecified dementia without behavioral disturbance: Secondary | ICD-10-CM | POA: Diagnosis not present

## 2020-01-17 DIAGNOSIS — G40909 Epilepsy, unspecified, not intractable, without status epilepticus: Secondary | ICD-10-CM | POA: Diagnosis not present

## 2020-01-17 DIAGNOSIS — M6389 Disorders of muscle in diseases classified elsewhere, multiple sites: Secondary | ICD-10-CM | POA: Diagnosis not present

## 2020-01-17 DIAGNOSIS — R2681 Unsteadiness on feet: Secondary | ICD-10-CM | POA: Diagnosis not present

## 2020-01-17 DIAGNOSIS — R41841 Cognitive communication deficit: Secondary | ICD-10-CM | POA: Diagnosis not present

## 2022-05-06 ENCOUNTER — Other Ambulatory Visit: Payer: Self-pay | Admitting: Orthopedic Surgery

## 2022-05-06 ENCOUNTER — Other Ambulatory Visit (INDEPENDENT_AMBULATORY_CARE_PROVIDER_SITE_OTHER): Payer: Medicare (Managed Care)

## 2022-05-06 ENCOUNTER — Encounter: Payer: Self-pay | Admitting: Orthopedic Surgery

## 2022-05-06 ENCOUNTER — Ambulatory Visit (INDEPENDENT_AMBULATORY_CARE_PROVIDER_SITE_OTHER): Payer: Medicare (Managed Care) | Admitting: Orthopedic Surgery

## 2022-05-06 VITALS — BP 142/69 | HR 87

## 2022-05-06 DIAGNOSIS — G8929 Other chronic pain: Secondary | ICD-10-CM | POA: Diagnosis not present

## 2022-05-06 DIAGNOSIS — M25562 Pain in left knee: Secondary | ICD-10-CM | POA: Diagnosis not present

## 2022-05-06 DIAGNOSIS — M1712 Unilateral primary osteoarthritis, left knee: Secondary | ICD-10-CM | POA: Diagnosis not present

## 2022-05-06 NOTE — Progress Notes (Signed)
New Patient Visit  Assessment: Brittney Reynolds is a 89 y.o. female with the following: 1. Chronic pain of left knee  Plan: Brittney Reynolds has pain in her left knee.  No recent injury.  Radiographs demonstrates a moderate to severe degenerative change of the left knee.  She has good range of motion on physical exam.  We discussed proceeding with an injection.  She states it is not hurting her enough to consider an injection.  If she is interested, we can complete this in the future.  Otherwise, follow-up as needed.  Follow-up: Return if symptoms worsen or fail to improve.  Subjective:  Chief Complaint  Patient presents with   New Patient (Initial Visit)   Knee Pain    LT knee//patient states the pain is getting better but still aggravates her    History of Present Illness: Brittney Reynolds is a 89 y.o. female who presents for evaluation of left knee pain.  She lives in a nursing facility.  Nobody is with her in clinic today.  She states that she has pain in the left knee chronically.  She has a history of multiple fractures to the left tibia, treated without surgery.  Currently, she is not complaining of significant pain in her left knee.  However, it has not bothered her recently.  No recent injury.  She does not take medicines.  She is not interested in an injection.   Review of Systems: No fevers or chills No numbness or tingling No chest pain No shortness of breath No bowel or bladder dysfunction No GI distress No headaches   Medical History:  Past Medical History:  Diagnosis Date   Anemia    Anxiety    Colon polyp 5 yrs ago   in  [name]   Depression    HTN (hypertension)    Polio    As a child   Seizures     Past Surgical History:  Procedure Laterality Date   FOOT SURGERY  11/11   Left foot   OTIF  2009   Right ankle    History reviewed. No pertinent family history. Social History   Tobacco Use   Smoking status: Never  Substance Use  Topics   Alcohol use: No   Drug use: No    Allergies  Allergen Reactions   Acetaminophen     Patient was instructed to only take Ibuprofen by MD    Current Meds  Medication Sig   Calcium Carbonate-Vitamin D (CALCIUM + D PO) Take 600 mg by mouth 2 (two) times daily.    cephALEXin (KEFLEX) 500 MG capsule Take 1 capsule (500 mg total) by mouth 3 (three) times daily.   diltiazem (TIAZAC) 360 MG 24 hr capsule Take 360 mg by mouth daily.   donepezil (ARICEPT) 5 MG tablet Take 5 mg by mouth at bedtime.   escitalopram (LEXAPRO) 10 MG tablet Take 10 mg by mouth daily.     ferrous sulfate 325 (65 FE) MG tablet Take 325 mg by mouth 2 (two) times daily with a meal.   levothyroxine (SYNTHROID, LEVOTHROID) 100 MCG tablet Take 100 mcg by mouth daily before breakfast.   losartan-hydrochlorothiazide (HYZAAR) 100-12.5 MG per tablet Take 1 tablet by mouth daily.   metoprolol succinate (TOPROL-XL) 25 MG 24 hr tablet Take 25 mg by mouth every evening.    nitrofurantoin, macrocrystal-monohydrate, (MACROBID) 100 MG capsule Take 1 capsule (100 mg total) by mouth 2 (two) times daily. For 7 days  oxybutynin (DITROPAN-XL) 5 MG 24 hr tablet Take 5 mg by mouth daily.   pantoprazole (PROTONIX) 40 MG tablet Take 40 mg by mouth daily.   phenytoin (DILANTIN) 125 MG/5ML suspension Take 125 mg by mouth every morning.   polyethylene glycol powder (GLYCOLAX/MIRALAX) powder Take 17 g by mouth daily.   primidone (MYSOLINE) 50 MG tablet Take 200 mg by mouth 3 (three) times daily.   raloxifene (EVISTA) 60 MG tablet Take 60 mg by mouth daily.      Objective: BP (!) 142/69   Pulse 87   Physical Exam:  General: Elderly female., Alert and oriented., No acute distress., and Seated in a wheelchair. Gait: Unable to ambulate.  Left knee without swelling.  Mild tenderness palpation along medial lateral joint line.  She tolerates gentle range of motion.  No obvious deformity.  No bruising.  No increased laxity varus or valgus  stress.  Negative Lachman.  IMAGING: I personally ordered and reviewed the following images   X-rays left knee were obtained in clinic today.  Near complete loss of joint space within the medial lateral compartment.  There is a remote injury to the proximal aspect of the tibial shaft.  This is healed.  Mild residual deformity.  No bony lesions.  Impression: Moderate left knee degenerative changes, with history of fracture to the proximal tibia.   New Medications:  No orders of the defined types were placed in this encounter.     Oliver Barre, MD  05/06/2022 12:17 PM

## 2022-12-25 ENCOUNTER — Emergency Department (HOSPITAL_COMMUNITY): Payer: Medicare (Managed Care)

## 2022-12-25 ENCOUNTER — Inpatient Hospital Stay (HOSPITAL_COMMUNITY)
Admission: EM | Admit: 2022-12-25 | Discharge: 2022-12-28 | DRG: 563 | Disposition: A | Discharge status: Hospice | Payer: Medicare (Managed Care) | Source: Skilled Nursing Facility | Attending: Family Medicine | Admitting: Family Medicine

## 2022-12-25 ENCOUNTER — Other Ambulatory Visit: Payer: Self-pay

## 2022-12-25 ENCOUNTER — Encounter (HOSPITAL_COMMUNITY): Payer: Self-pay | Admitting: Emergency Medicine

## 2022-12-25 DIAGNOSIS — R636 Underweight: Secondary | ICD-10-CM | POA: Diagnosis present

## 2022-12-25 DIAGNOSIS — Z79899 Other long term (current) drug therapy: Secondary | ICD-10-CM

## 2022-12-25 DIAGNOSIS — F03918 Unspecified dementia, unspecified severity, with other behavioral disturbance: Secondary | ICD-10-CM | POA: Diagnosis present

## 2022-12-25 DIAGNOSIS — E039 Hypothyroidism, unspecified: Secondary | ICD-10-CM | POA: Diagnosis present

## 2022-12-25 DIAGNOSIS — Z8612 Personal history of poliomyelitis: Secondary | ICD-10-CM

## 2022-12-25 DIAGNOSIS — S82192A Other fracture of upper end of left tibia, initial encounter for closed fracture: Principal | ICD-10-CM | POA: Diagnosis present

## 2022-12-25 DIAGNOSIS — Z886 Allergy status to analgesic agent status: Secondary | ICD-10-CM | POA: Diagnosis not present

## 2022-12-25 DIAGNOSIS — D62 Acute posthemorrhagic anemia: Secondary | ICD-10-CM | POA: Diagnosis present

## 2022-12-25 DIAGNOSIS — Z79890 Hormone replacement therapy (postmenopausal): Secondary | ICD-10-CM | POA: Diagnosis not present

## 2022-12-25 DIAGNOSIS — E785 Hyperlipidemia, unspecified: Secondary | ICD-10-CM | POA: Diagnosis present

## 2022-12-25 DIAGNOSIS — K219 Gastro-esophageal reflux disease without esophagitis: Secondary | ICD-10-CM | POA: Diagnosis present

## 2022-12-25 DIAGNOSIS — Z91011 Allergy to milk products: Secondary | ICD-10-CM

## 2022-12-25 DIAGNOSIS — G40909 Epilepsy, unspecified, not intractable, without status epilepticus: Secondary | ICD-10-CM | POA: Diagnosis present

## 2022-12-25 DIAGNOSIS — M19042 Primary osteoarthritis, left hand: Secondary | ICD-10-CM | POA: Diagnosis present

## 2022-12-25 DIAGNOSIS — F32A Depression, unspecified: Secondary | ICD-10-CM | POA: Diagnosis present

## 2022-12-25 DIAGNOSIS — M19041 Primary osteoarthritis, right hand: Secondary | ICD-10-CM | POA: Diagnosis present

## 2022-12-25 DIAGNOSIS — F419 Anxiety disorder, unspecified: Secondary | ICD-10-CM | POA: Diagnosis present

## 2022-12-25 DIAGNOSIS — Z993 Dependence on wheelchair: Secondary | ICD-10-CM | POA: Diagnosis not present

## 2022-12-25 DIAGNOSIS — W19XXXA Unspecified fall, initial encounter: Secondary | ICD-10-CM | POA: Diagnosis present

## 2022-12-25 DIAGNOSIS — R413 Other amnesia: Secondary | ICD-10-CM | POA: Diagnosis not present

## 2022-12-25 DIAGNOSIS — Z66 Do not resuscitate: Secondary | ICD-10-CM | POA: Diagnosis not present

## 2022-12-25 DIAGNOSIS — S82202A Unspecified fracture of shaft of left tibia, initial encounter for closed fracture: Secondary | ICD-10-CM | POA: Diagnosis not present

## 2022-12-25 DIAGNOSIS — R739 Hyperglycemia, unspecified: Secondary | ICD-10-CM | POA: Diagnosis present

## 2022-12-25 DIAGNOSIS — S82252A Displaced comminuted fracture of shaft of left tibia, initial encounter for closed fracture: Secondary | ICD-10-CM | POA: Diagnosis present

## 2022-12-25 DIAGNOSIS — Y92129 Unspecified place in nursing home as the place of occurrence of the external cause: Secondary | ICD-10-CM | POA: Diagnosis not present

## 2022-12-25 DIAGNOSIS — D509 Iron deficiency anemia, unspecified: Secondary | ICD-10-CM | POA: Diagnosis present

## 2022-12-25 DIAGNOSIS — Z7189 Other specified counseling: Secondary | ICD-10-CM | POA: Diagnosis not present

## 2022-12-25 DIAGNOSIS — S82832A Other fracture of upper and lower end of left fibula, initial encounter for closed fracture: Secondary | ICD-10-CM | POA: Diagnosis present

## 2022-12-25 DIAGNOSIS — Z681 Body mass index (BMI) 19 or less, adult: Secondary | ICD-10-CM | POA: Diagnosis not present

## 2022-12-25 DIAGNOSIS — I10 Essential (primary) hypertension: Secondary | ICD-10-CM | POA: Diagnosis present

## 2022-12-25 DIAGNOSIS — Z515 Encounter for palliative care: Secondary | ICD-10-CM | POA: Diagnosis not present

## 2022-12-25 LAB — I-STAT CHEM 8, ED
BUN: 15 mg/dL (ref 8–23)
Calcium, Ion: 1.13 mmol/L — ABNORMAL LOW (ref 1.15–1.40)
Chloride: 102 mmol/L (ref 98–111)
Creatinine, Ser: 0.7 mg/dL (ref 0.44–1.00)
Glucose, Bld: 105 mg/dL — ABNORMAL HIGH (ref 70–99)
HCT: 36 % (ref 36.0–46.0)
Hemoglobin: 12.2 g/dL (ref 12.0–15.0)
Potassium: 3.5 mmol/L (ref 3.5–5.1)
Sodium: 140 mmol/L (ref 135–145)
TCO2: 25 mmol/L (ref 22–32)

## 2022-12-25 MED ORDER — MORPHINE SULFATE (PF) 2 MG/ML IV SOLN
2.0000 mg | Freq: Once | INTRAVENOUS | Status: AC
  Administered 2022-12-25: 2 mg via INTRAVENOUS
  Filled 2022-12-25: qty 1

## 2022-12-25 NOTE — ED Provider Notes (Signed)
[place] EMERGENCY DEPARTMENT AT [facility] Provider Note   CSN: [id number] Arrival date & time: 12/25/22  1836     History {Add pertinent medical, surgical, social history, OB history to HPI:1} Chief Complaint  Patient presents with   Brittney Reynolds is a 89 y.o. female.  Patient has dementia.  She fell at the nursing home with complaints of left knee pain   Fall       Home Medications Prior to Admission medications   Medication Sig Start Date End Date Taking? Authorizing Provider  Calcium Carbonate-Vitamin D (CALCIUM + D PO) Take 600 mg by mouth 2 (two) times daily.     [provider]  cephALEXin (KEFLEX) 500 MG capsule Take 1 capsule (500 mg total) by mouth 3 (three) times daily. 09/23/19   Devoria Albe, MD  diltiazem (TIAZAC) 360 MG 24 hr capsule Take 360 mg by mouth daily. 10/23/12   [provider]  donepezil (ARICEPT) 5 MG tablet Take 5 mg by mouth at bedtime.    [provider]  escitalopram (LEXAPRO) 10 MG tablet Take 10 mg by mouth daily.      [provider]  ferrous sulfate 325 (65 FE) MG tablet Take 325 mg by mouth 2 (two) times daily with a meal.    [provider]  levothyroxine (SYNTHROID, LEVOTHROID) 100 MCG tablet Take 100 mcg by mouth daily before breakfast.    [provider]  losartan-hydrochlorothiazide (HYZAAR) 100-12.5 MG per tablet Take 1 tablet by mouth daily. 05/09/13   [provider]  metoprolol succinate (TOPROL-XL) 25 MG 24 hr tablet Take 25 mg by mouth every evening.     [provider]  nitrofurantoin, macrocrystal-monohydrate, (MACROBID) 100 MG capsule Take 1 capsule (100 mg total) by mouth 2 (two) times daily. For 7 days 02/28/15   Triplett, Tammy, PA-C  oxybutynin (DITROPAN-XL) 5 MG 24 hr tablet Take 5 mg by mouth daily. 10/16/12   [provider]  pantoprazole (PROTONIX) 40 MG tablet Take 40 mg by mouth daily.    [provider]   phenytoin (DILANTIN) 125 MG/5ML suspension Take 125 mg by mouth every morning.    [provider]  polyethylene glycol powder (GLYCOLAX/MIRALAX) powder Take 17 g by mouth daily. 04/15/13   [provider]  primidone (MYSOLINE) 50 MG tablet Take 200 mg by mouth 3 (three) times daily.    [provider]  raloxifene (EVISTA) 60 MG tablet Take 60 mg by mouth daily.      [provider]      Allergies    Acetaminophen    Review of Systems   Review of Systems  Physical Exam Updated Vital Signs BP (!) 181/87 (BP Location: Left Arm)   Pulse 83   Temp 98.1 F (36.7 C) (Oral)   Resp 17   Ht 5\' 6"  (1.676 m)   Wt 68 kg   SpO2 98%   BMI 24.21 kg/m  Physical Exam  ED Results / Procedures / Treatments   Labs (all labs ordered are listed, but only abnormal results are displayed) Labs Reviewed  I-STAT CHEM 8, ED - Abnormal; Notable for the following components:      Result Value   Glucose, Bld 105 (*)    Calcium, Ion 1.13 (*)    All other components within normal limits    EKG None  Radiology DG Ankle Complete Left  Result Date: 12/25/2022 CLINICAL DATA:  Fall EXAM: LEFT ANKLE  COMPLETE - 3+ VIEW COMPARISON:  None Available. FINDINGS: Lateral compression plate and screw fixation of the distal tibia. No evidence of hardware complication. Old/healed distal fibular shaft fracture. No fracture or dislocation is seen. The ankle mortise is intact. The visualized soft tissues are unremarkable. IMPRESSION: No fracture or dislocation is seen. Lateral compression plate and screw fixation of the distal tibia. No evidence of hardware complication. Electronically Signed   By: Charline Bills M.D.   On: 12/25/2022 19:51   DG Foot Complete Left  Result Date: 12/25/2022 CLINICAL DATA:  Fall EXAM: LEFT FOOT - COMPLETE 3+ VIEW COMPARISON:  None Available. FINDINGS: Status post ORIF of the distal tibia, incompletely visualized. No evidence of hardware complication. No  fracture or dislocation is seen. The joint spaces are preserved.  Hallux valgus deformity. The visualized soft tissues are unremarkable. IMPRESSION: No fracture or dislocation is seen. Status post ORIF of the distal tibia, incompletely visualized. No evidence of hardware complication. Electronically Signed   By: Charline Bills M.D.   On: 12/25/2022 19:51   DG Knee Complete 4 Views Left  Result Date: 12/25/2022 CLINICAL DATA:  Fall EXAM: LEFT KNEE - COMPLETE 4+ VIEW COMPARISON:  None Available. FINDINGS: Mildly comminuted oblique proximal tibial shaft fracture. No intra-articular extension. Healed left proximal fibular shaft fracture. However, a superimposed nondisplaced fracture involving the fibular neck is suspected. Mild anterior soft tissue swelling. IMPRESSION: Mildly comminuted oblique proximal tibial shaft fracture, as above. Suspected nondisplaced fibular neck fracture. Healed left proximal fibular shaft fracture. Electronically Signed   By: Charline Bills M.D.   On: 12/25/2022 19:50   DG Hip Unilat W or Wo Pelvis 2-3 Views Left  Result Date: 12/25/2022 CLINICAL DATA:  Fall EXAM: DG HIP (WITH OR WITHOUT PELVIS) 2-3V LEFT COMPARISON:  None Available. FINDINGS: Status post ORIF of the left hip. No evidence of hardware complication. No fracture or dislocation is seen. Mild degenerative changes of the right hip. Visualized bony pelvis appears intact. IMPRESSION: Status post ORIF of the left hip. No evidence of hardware complication. No fracture or dislocation is seen. Electronically Signed   By: Charline Bills M.D.   On: 12/25/2022 19:49    Procedures Procedures  {Document cardiac monitor, telemetry assessment procedure when appropriate:1}  Medications Ordered in ED Medications  morphine (PF) 2 MG/ML injection 2 mg (2 mg Intravenous Given 12/25/22 2036)    ED Course/ Medical Decision Making/ A&P   {Patient with a proximal tibia and fibular fracture.  I spoke with Dr. Romeo Apple of  orthopedics and he stated to have the patient admitted to medicine and he will see the patient tomorrow and discuss surgery with the family Click here for ABCD2, HEART and other calculatorsREFRESH Note before signing :1}                              Medical Decision Making Amount and/or Complexity of Data Reviewed Radiology: ordered.  Risk Prescription drug management. Decision regarding hospitalization.   Patient with proximal tibia and fibular fracture that does not go into the joint.  She is admitted to medicine for pain control and orthopedics will consult tomorrow {Document critical care time when appropriate:1} {Document review of labs and clinical decision tools ie heart score, Chads2Vasc2 etc:1}  {Document your independent review of radiology images, and any outside records:1} {Document your discussion with family members, caretakers, and with consultants:1} {Document social determinants of health affecting pt's care:1} {Document your decision making why  or why not admission, treatments were needed:1} Final Clinical Impression(s) / ED Diagnoses Final diagnoses:  Fall, initial encounter    Rx / DC Orders ED Discharge Orders     None

## 2022-12-25 NOTE — ED Triage Notes (Signed)
[facility] called out after pt was found on the ground for the 2nd time today. PT had been more active than normal and had made attempts at dressing herself. Facility reports concern for left leg with a visible difference under the left knee. Facility also said there is concern for an injury to the left hip. Patient is confused and has dementia at baseline.

## 2022-12-25 NOTE — Progress Notes (Signed)
Patient ID: Brittney Reynolds, female   DOB: [birth year], 89 y.o.   MRN: [id number]  BP (!) 181/87 (BP Location: Left Arm)   Pulse 83   Temp 98.1 F (36.7 C) (Oral)   Resp 17   Ht 5\' 6"  (1.676 m)   Wt 68 kg   SpO2 98%   BMI 24.21 kg/m   Knee immobilizer for now  I ll discuss with family on definitive treatment   - if they choose surgery then we ll do a nail on T or W

## 2022-12-25 NOTE — ED Notes (Signed)
Pt returned from X Ray.

## 2022-12-26 ENCOUNTER — Encounter (HOSPITAL_COMMUNITY): Payer: Self-pay | Admitting: Internal Medicine

## 2022-12-26 ENCOUNTER — Inpatient Hospital Stay (HOSPITAL_COMMUNITY): Payer: Medicare (Managed Care)

## 2022-12-26 DIAGNOSIS — Z7189 Other specified counseling: Secondary | ICD-10-CM | POA: Diagnosis not present

## 2022-12-26 DIAGNOSIS — Z515 Encounter for palliative care: Secondary | ICD-10-CM

## 2022-12-26 DIAGNOSIS — W19XXXA Unspecified fall, initial encounter: Secondary | ICD-10-CM

## 2022-12-26 DIAGNOSIS — R413 Other amnesia: Secondary | ICD-10-CM

## 2022-12-26 DIAGNOSIS — S82202A Unspecified fracture of shaft of left tibia, initial encounter for closed fracture: Secondary | ICD-10-CM | POA: Diagnosis not present

## 2022-12-26 LAB — CBC WITH DIFFERENTIAL/PLATELET
Abs Immature Granulocytes: 0.02 10*3/uL (ref 0.00–0.07)
Basophils Absolute: 0 10*3/uL (ref 0.0–0.1)
Basophils Relative: 0 %
Eosinophils Absolute: 0.1 10*3/uL (ref 0.0–0.5)
Eosinophils Relative: 1 %
HCT: 33.1 % — ABNORMAL LOW (ref 36.0–46.0)
Hemoglobin: 11 g/dL — ABNORMAL LOW (ref 12.0–15.0)
Immature Granulocytes: 0 %
Lymphocytes Relative: 12 %
Lymphs Abs: 1.3 10*3/uL (ref 0.7–4.0)
MCH: 30.5 pg (ref 26.0–34.0)
MCHC: 33.2 g/dL (ref 30.0–36.0)
MCV: 91.7 fL (ref 80.0–100.0)
Monocytes Absolute: 0.7 10*3/uL (ref 0.1–1.0)
Monocytes Relative: 7 %
Neutro Abs: 8.5 10*3/uL — ABNORMAL HIGH (ref 1.7–7.7)
Neutrophils Relative %: 80 %
Platelets: 191 10*3/uL (ref 150–400)
RBC: 3.61 MIL/uL — ABNORMAL LOW (ref 3.87–5.11)
RDW: 12.7 % (ref 11.5–15.5)
WBC: 10.6 10*3/uL — ABNORMAL HIGH (ref 4.0–10.5)
nRBC: 0 % (ref 0.0–0.2)

## 2022-12-26 LAB — COMPREHENSIVE METABOLIC PANEL
ALT: 12 U/L (ref 0–44)
AST: 26 U/L (ref 15–41)
Albumin: 3.4 g/dL — ABNORMAL LOW (ref 3.5–5.0)
Alkaline Phosphatase: 104 U/L (ref 38–126)
Anion gap: 8 (ref 5–15)
BUN: 14 mg/dL (ref 8–23)
CO2: 25 mmol/L (ref 22–32)
Calcium: 8.5 mg/dL — ABNORMAL LOW (ref 8.9–10.3)
Chloride: 104 mmol/L (ref 98–111)
Creatinine, Ser: 0.68 mg/dL (ref 0.44–1.00)
GFR, Estimated: >60 mL/min (ref >60)
Glucose, Bld: 110 mg/dL — ABNORMAL HIGH (ref 70–99)
Potassium: 3.8 mmol/L (ref 3.5–5.1)
Sodium: 137 mmol/L (ref 135–145)
Total Bilirubin: 0.6 mg/dL (ref <1.2)
Total Protein: 6.3 g/dL — ABNORMAL LOW (ref 6.5–8.1)

## 2022-12-26 LAB — CBC
HCT: 32.1 % — ABNORMAL LOW (ref 36.0–46.0)
Hemoglobin: 10.2 g/dL — ABNORMAL LOW (ref 12.0–15.0)
MCH: 30 pg (ref 26.0–34.0)
MCHC: 31.8 g/dL (ref 30.0–36.0)
MCV: 94.4 fL (ref 80.0–100.0)
Platelets: 218 10*3/uL (ref 150–400)
RBC: 3.4 MIL/uL — ABNORMAL LOW (ref 3.87–5.11)
RDW: 12.7 % (ref 11.5–15.5)
WBC: 10.3 10*3/uL (ref 4.0–10.5)
nRBC: 0 % (ref 0.0–0.2)

## 2022-12-26 LAB — BASIC METABOLIC PANEL
Anion gap: 6 (ref 5–15)
BUN: 15 mg/dL (ref 8–23)
CO2: 29 mmol/L (ref 22–32)
Calcium: 8.6 mg/dL — ABNORMAL LOW (ref 8.9–10.3)
Chloride: 104 mmol/L (ref 98–111)
Creatinine, Ser: 0.69 mg/dL (ref 0.44–1.00)
GFR, Estimated: >60 mL/min (ref >60)
Glucose, Bld: 107 mg/dL — ABNORMAL HIGH (ref 70–99)
Potassium: 4.5 mmol/L (ref 3.5–5.1)
Sodium: 139 mmol/L (ref 135–145)

## 2022-12-26 LAB — URINALYSIS, ROUTINE W REFLEX MICROSCOPIC
Bilirubin Urine: NEGATIVE
Glucose, UA: NEGATIVE mg/dL
Hgb urine dipstick: NEGATIVE
Ketones, ur: NEGATIVE mg/dL
Nitrite: NEGATIVE
Protein, ur: NEGATIVE mg/dL
Specific Gravity, Urine: 1.015 (ref 1.005–1.030)
pH: 6 (ref 5.0–8.0)

## 2022-12-26 LAB — PHOSPHORUS: Phosphorus: 4.2 mg/dL (ref 2.5–4.6)

## 2022-12-26 LAB — MAGNESIUM: Magnesium: 2.1 mg/dL (ref 1.7–2.4)

## 2022-12-26 MED ORDER — MEMANTINE HCL 10 MG PO TABS
10.0000 mg | ORAL_TABLET | Freq: Two times a day (BID) | ORAL | Status: DC
  Administered 2022-12-26 – 2022-12-27 (×4): 10 mg via ORAL
  Filled 2022-12-26 (×5): qty 1

## 2022-12-26 MED ORDER — HYDROMORPHONE HCL 1 MG/ML IJ SOLN
0.5000 mg | INTRAMUSCULAR | Status: AC | PRN
  Administered 2022-12-27 – 2022-12-28 (×3): 0.5 mg via INTRAVENOUS
  Filled 2022-12-26 (×4): qty 0.5

## 2022-12-26 MED ORDER — MELATONIN 3 MG PO TABS
6.0000 mg | ORAL_TABLET | Freq: Every evening | ORAL | Status: DC | PRN
  Administered 2022-12-26 – 2022-12-27 (×2): 6 mg via ORAL
  Filled 2022-12-26 (×2): qty 2

## 2022-12-26 MED ORDER — FERROUS SULFATE 325 (65 FE) MG PO TABS
325.0000 mg | ORAL_TABLET | Freq: Two times a day (BID) | ORAL | Status: DC
  Administered 2022-12-26 – 2022-12-27 (×3): 325 mg via ORAL
  Filled 2022-12-26 (×4): qty 1

## 2022-12-26 MED ORDER — ACETAMINOPHEN 325 MG PO TABS: 650.0000 mg | ORAL_TABLET | Freq: Four times a day (QID) | ORAL | Status: DC | PRN

## 2022-12-26 MED ORDER — PROCHLORPERAZINE EDISYLATE 10 MG/2ML IJ SOLN: 5.0000 mg | Freq: Four times a day (QID) | INTRAMUSCULAR | Status: DC | PRN

## 2022-12-26 MED ORDER — HEPARIN SODIUM (PORCINE) 5000 UNIT/ML IJ SOLN
5000.0000 [IU] | Freq: Three times a day (TID) | INTRAMUSCULAR | Status: DC
  Administered 2022-12-26 – 2022-12-28 (×5): 5000 [IU] via SUBCUTANEOUS
  Filled 2022-12-26 (×4): qty 1

## 2022-12-26 MED ORDER — LORAZEPAM 1 MG PO TABS
1.0000 mg | ORAL_TABLET | Freq: Four times a day (QID) | ORAL | Status: DC | PRN
  Administered 2022-12-26 – 2022-12-27 (×3): 1 mg via ORAL
  Filled 2022-12-26 (×3): qty 1

## 2022-12-26 MED ORDER — LORAZEPAM 2 MG/ML IJ SOLN
0.5000 mg | INTRAMUSCULAR | Status: AC
  Administered 2022-12-26: 0.5 mg via INTRAVENOUS
  Filled 2022-12-26: qty 1

## 2022-12-26 MED ORDER — SODIUM CHLORIDE 0.9 % IV SOLN: INTRAVENOUS | Status: DC

## 2022-12-26 MED ORDER — LORAZEPAM 2 MG/ML PO CONC: 1.0000 mg | Freq: Four times a day (QID) | ORAL | Status: DC | PRN

## 2022-12-26 MED ORDER — PRIMIDONE 50 MG PO TABS
200.0000 mg | ORAL_TABLET | Freq: Three times a day (TID) | ORAL | Status: DC
  Administered 2022-12-26 – 2022-12-27 (×5): 200 mg via ORAL
  Filled 2022-12-26 (×5): qty 4

## 2022-12-26 MED ORDER — LEVOTHYROXINE SODIUM 50 MCG PO TABS
100.0000 ug | ORAL_TABLET | Freq: Every day | ORAL | Status: DC
  Administered 2022-12-28: 100 ug via ORAL
  Filled 2022-12-26 (×2): qty 2

## 2022-12-26 MED ORDER — PHENYTOIN 125 MG/5ML PO SUSP: 125.0000 mg | Freq: Every morning | ORAL | Status: DC

## 2022-12-26 MED ORDER — ATORVASTATIN CALCIUM 10 MG PO TABS
10.0000 mg | ORAL_TABLET | Freq: Every day | ORAL | Status: DC
  Administered 2022-12-26: 10 mg via ORAL
  Filled 2022-12-26: qty 1

## 2022-12-26 MED ORDER — OXYCODONE HCL 5 MG PO TABS
5.0000 mg | ORAL_TABLET | Freq: Four times a day (QID) | ORAL | Status: AC | PRN
  Administered 2022-12-26 – 2022-12-27 (×3): 5 mg via ORAL
  Filled 2022-12-26 (×4): qty 1

## 2022-12-26 MED ORDER — PANTOPRAZOLE SODIUM 40 MG PO TBEC
40.0000 mg | DELAYED_RELEASE_TABLET | Freq: Every day | ORAL | Status: DC
  Filled 2022-12-26 (×2): qty 1

## 2022-12-26 MED ORDER — POLYETHYLENE GLYCOL 3350 17 G PO PACK: 17.0000 g | PACK | Freq: Every day | ORAL | Status: DC | PRN

## 2022-12-26 MED ORDER — MIRTAZAPINE 15 MG PO TABS
7.5000 mg | ORAL_TABLET | Freq: Every day | ORAL | Status: DC
  Administered 2022-12-26 – 2022-12-27 (×2): 7.5 mg via ORAL
  Filled 2022-12-26 (×2): qty 1

## 2022-12-26 MED ORDER — METOPROLOL SUCCINATE ER 25 MG PO TB24
12.5000 mg | ORAL_TABLET | Freq: Every evening | ORAL | Status: DC
  Administered 2022-12-26 – 2022-12-27 (×2): 12.5 mg via ORAL
  Filled 2022-12-26 (×3): qty 1

## 2022-12-26 NOTE — Plan of Care (Signed)
  Problem: Clinical Measurements: Goal: Ability to maintain clinical measurements within normal limits will improve Outcome: Progressing Goal: Will remain free from infection Outcome: Progressing Goal: Diagnostic test results will improve Outcome: Progressing Goal: Respiratory complications will improve Outcome: Progressing Goal: Cardiovascular complication will be avoided Outcome: Progressing   Problem: Activity: Goal: Risk for activity intolerance will decrease Outcome: Progressing   Problem: Nutrition: Goal: Adequate nutrition will be maintained Outcome: Progressing   Problem: Elimination: Goal: Will not experience complications related to bowel motility Outcome: Progressing Goal: Will not experience complications related to urinary retention Outcome: Progressing   Problem: Pain Management: Goal: General experience of comfort will improve Outcome: Progressing   Problem: Safety: Goal: Ability to remain free from injury will improve Outcome: Progressing   Problem: Skin Integrity: Goal: Risk for impaired skin integrity will decrease Outcome: Progressing

## 2022-12-26 NOTE — Hospital Course (Addendum)
Brittney Reynolds is a 89 y.o. female with medical history significant for dementia, seizure disorder, chronic anxiety/depression, hypertension, hyperlipidemia, hypothyroidism, iron deficiency anemia, GERD, who presented after an unwitnessed fall at [facility].   In the ED, alert and confused.  Unable to provide a reliable history due to underlying advanced dementia.   ED Course: Temperature 98.1.  BP 181/87, pulse 83, respiratory 17, saturation 98% on room air.  Lab studies notable for WBC 10.6, hemoglobin 11.0, platelet count 191, neutrophil count 8.5.  Serum glucose 110, albumin 3.4.  UA pending.  Imaging revealed mildly comminuted oblique proximal tibial shaft fracture, and suspected nondisplaced fibular neck fracture. Healed left proximal fibular shaft fracture.   EDP discussed the case with orthopedic surgery who will see in consultation.   Admitted by [facility], hospitalist service.    Assessment/Plan   Left tip/fib fracture Left mildly comminuted oblique proximal tibial shaft fracture, and suspected nondisplaced fibular neck fracture. -Unwitnessed fall, POA -Continue pain management, -Hemodynamically stable - Orthopedic surgery consulted-appreciate Dr. Romeo Apple follow-up  -Currently plan for surgical invention on Wednesday, 12/28/2022   Dementia with behavioral disturbances -Mentally stable, pleasantly confused, at baseline -Continue meds Reorient as needed Fall precautions   Seizure disorder Resume home AEDs Seizure precautions   Acute blood loss anemia in the setting of fracture Continue to monitor H&H  -Stable   Hypothyroidism Resume home levothyroxine   GERD Resume home PPI   Iron deficiency anemia Resume home iron supplement   Hyperlipidemia Resume home Lipitor.   Hyperglycemia, mild Gentle IV fluid hydration NS at 40 cc/h x 1 day

## 2022-12-26 NOTE — TOC Initial Note (Signed)
Transition of Care [facility]) - Initial/Assessment Note    Patient Details  Name: Brittney Reynolds MRN: [id number] Date of Birth: 1933/02/14  Transition of Care [facility]) CM/SW Contact:    Villa Herb, LCSWA Phone Number: 12/26/2022, 11:34 AM  Clinical Narrative:                 CSW noted per chart review that pt admitted to hospital from [facility]. CSW spoke to [place] in admissions who confirms pt is a long term resident and can return once medically stable. TOC to follow.   Expected Discharge Plan: Long Term Nursing Home Barriers to Discharge: Continued Medical Work up   Patient Goals and CMS Choice Patient states their goals for this hospitalization and ongoing recovery are:: return home CMS Medicare.gov Compare Post Acute Care list provided to:: Patient Choice offered to / list presented to : Patient      Expected Discharge Plan and Services In-house Referral: Clinical Social Work Discharge Planning Services: CM Consult Post Acute Care Choice: Nursing Home Living arrangements for the past 2 months: Skilled Nursing Facility                                      Prior Living Arrangements/Services Living arrangements for the past 2 months: Skilled Nursing Facility Lives with:: Facility Resident Patient language and need for interpreter reviewed:: Yes Do you feel safe going back to the place where you live?: Yes      Need for Family Participation in Patient Care: Yes (Comment) Care giver support system in place?: Yes (comment)   Criminal Activity/Legal Involvement Pertinent to Current Situation/Hospitalization: No - Comment as needed  Activities of Daily Living   ADL Screening (condition at time of admission) Independently performs ADLs?: No Does the patient have a NEW difficulty with bathing/dressing/toileting/self-feeding that is expected to last >3 days?: No Does the patient have a NEW difficulty with getting in/out of bed, walking, or climbing stairs that  is expected to last >3 days?: No Does the patient have a NEW difficulty with communication that is expected to last >3 days?: No Is the patient deaf or have difficulty hearing?: No Does the patient have difficulty seeing, even when wearing glasses/contacts?:  (uto) Does the patient have difficulty concentrating, remembering, or making decisions?: Yes  Permission Sought/Granted                  Emotional Assessment Appearance:: Appears stated age       Alcohol / Substance Use: Not Applicable Psych Involvement: No (comment)  Admission diagnosis:  Fall, initial encounter [W19.XXXA] Patient Active Problem List   Diagnosis Date Noted   Fall, initial encounter 12/25/2022   Encephalomalacia on imaging study 11/14/2014   Encephalopathy, metabolic 11/14/2014   Lower urinary tract infectious disease 11/12/2014   Dysarthria 11/12/2014   Hypertension 11/12/2014   Seizure disorder (HCC) 11/12/2014   Weakness generalized 10/26/2012   Anemia 10/26/2012   Diarrhea 10/26/2012   FRACTURE, BIMALLEOLAR 07/09/2007   PCP:  Kari Baars, MD Pharmacy:   [facility] - [place], [place] - [address] [place] [address] Phone: [phone] Fax: [phone]     Social Determinants of Health (SDOH) Social History: SDOH Screenings   Food Insecurity: Patient Unable To Answer (12/26/2022)  Housing: Patient Unable To Answer (12/26/2022)  Transportation Needs: Patient Unable To Answer (12/26/2022)  Utilities: Patient Unable To Answer (  12/26/2022)  Tobacco Use: Unknown (12/26/2022)   SDOH Interventions:     Readmission Risk Interventions    12/26/2022   11:32 AM  Readmission Risk Prevention Plan  Transportation Screening Complete  Home Care Screening Complete  Medication Review (RN CM) Complete

## 2022-12-26 NOTE — Consult Note (Addendum)
Reason for Consult:fractured tibia Referring Physician: Dr Estell Harpin   Chief Complaint  Patient presents with   Brittney Reynolds is an 89 y.o. female.  HPI: 89 yo female from [place] [facility] presented to ER after mechanical fall c/o left leg pain. She has dementia and has had orif left distal tibia    Past Medical History:  Diagnosis Date   Anemia    Anxiety    Colon polyp 5 yrs ago   in  [name]   Depression    HTN (hypertension)    Polio    As a child   Seizures (HCC)     Past Surgical History:  Procedure Laterality Date   FOOT SURGERY  11/11   Left foot   OTIF  2009   Right ankle    History reviewed. No pertinent family history.  Social History:  reports that she has never smoked. She does not have any smokeless tobacco history on file. She reports that she does not drink alcohol and does not use drugs.  Allergies:  Allergies  Allergen Reactions   Acetaminophen     Patient was instructed to only take Ibuprofen by MD   Milk-Related Compounds Other (See Comments)    Unknown     Medications: I have reviewed the patient's current medications.  Results for orders placed or performed during the hospital encounter of 12/25/22 (from the past 48 hour(s))  I-stat chem 8, ED (not at [facility], [facility] or [facility])     Status: Abnormal   Collection Time: 12/25/22 11:13 PM  Result Value Ref Range   Sodium 140 135 - 145 mmol/L   Potassium 3.5 3.5 - 5.1 mmol/L   Chloride 102 98 - 111 mmol/L   BUN 15 8 - 23 mg/dL   Creatinine, Ser 4.09 0.44 - 1.00 mg/dL   Glucose, Bld 811 (H) 70 - 99 mg/dL    Comment: Glucose reference range applies only to samples taken after fasting for at least 8 hours.   Calcium, Ion 1.13 (L) 1.15 - 1.40 mmol/L   TCO2 25 22 - 32 mmol/L   Hemoglobin 12.2 12.0 - 15.0 g/dL   HCT 91.4 78.2 - 95.6 %  CBC with Differential     Status: Abnormal   Collection Time: 12/25/22 11:45 PM  Result Value Ref Range   WBC 10.6 (H) 4.0 - 10.5 K/uL   RBC  3.61 (L) 3.87 - 5.11 MIL/uL   Hemoglobin 11.0 (L) 12.0 - 15.0 g/dL   HCT 21.3 (L) 08.6 - 57.8 %   MCV 91.7 80.0 - 100.0 fL   MCH 30.5 26.0 - 34.0 pg   MCHC 33.2 30.0 - 36.0 g/dL   RDW 46.9 62.9 - 52.8 %   Platelets 191 150 - 400 K/uL   nRBC 0.0 0.0 - 0.2 %   Neutrophils Relative % 80 %   Neutro Abs 8.5 (H) 1.7 - 7.7 K/uL   Lymphocytes Relative 12 %   Lymphs Abs 1.3 0.7 - 4.0 K/uL   Monocytes Relative 7 %   Monocytes Absolute 0.7 0.1 - 1.0 K/uL   Eosinophils Relative 1 %   Eosinophils Absolute 0.1 0.0 - 0.5 K/uL   Basophils Relative 0 %   Basophils Absolute 0.0 0.0 - 0.1 K/uL   Immature Granulocytes 0 %   Abs Immature Granulocytes 0.02 0.00 - 0.07 K/uL    Comment: Performed at [facility], [address], [place], [place] [address]  Comprehensive metabolic panel  Status: Abnormal   Collection Time: 12/25/22 11:45 PM  Result Value Ref Range   Sodium 137 135 - 145 mmol/L   Potassium 3.8 3.5 - 5.1 mmol/L   Chloride 104 98 - 111 mmol/L   CO2 25 22 - 32 mmol/L   Glucose, Bld 110 (H) 70 - 99 mg/dL    Comment: Glucose reference range applies only to samples taken after fasting for at least 8 hours.   BUN 14 8 - 23 mg/dL   Creatinine, Ser 1.61 0.44 - 1.00 mg/dL   Calcium 8.5 (L) 8.9 - 10.3 mg/dL   Total Protein 6.3 (L) 6.5 - 8.1 g/dL   Albumin 3.4 (L) 3.5 - 5.0 g/dL   AST 26 15 - 41 U/L   ALT 12 0 - 44 U/L   Alkaline Phosphatase 104 38 - 126 U/L   Total Bilirubin 0.6 <1.2 mg/dL   GFR, Estimated >09 >60 mL/min    Comment: (NOTE) Calculated using the CKD-EPI Creatinine Equation (2021)    Anion gap 8 5 - 15    Comment: Performed at [facility], [address] [place], [place] [address]  CBC     Status: Abnormal   Collection Time: 12/26/22  4:31 AM  Result Value Ref Range   WBC 10.3 4.0 - 10.5 K/uL   RBC 3.40 (L) 3.87 - 5.11 MIL/uL   Hemoglobin 10.2 (L) 12.0 - 15.0 g/dL   HCT 81.1 (L) 91.4 - 78.2 %   MCV 94.4 80.0 - 100.0 fL   MCH 30.0 26.0 - 34.0 pg   MCHC 31.8 30.0 -  36.0 g/dL   RDW 95.6 21.3 - 08.6 %   Platelets 218 150 - 400 K/uL   nRBC 0.0 0.0 - 0.2 %    Comment: Performed at [facility], [address] [place], [place] [address]  Basic metabolic panel     Status: Abnormal   Collection Time: 12/26/22  4:31 AM  Result Value Ref Range   Sodium 139 135 - 145 mmol/L   Potassium 4.5 3.5 - 5.1 mmol/L   Chloride 104 98 - 111 mmol/L   CO2 29 22 - 32 mmol/L   Glucose, Bld 107 (H) 70 - 99 mg/dL    Comment: Glucose reference range applies only to samples taken after fasting for at least 8 hours.   BUN 15 8 - 23 mg/dL   Creatinine, Ser 9.62 0.44 - 1.00 mg/dL   Calcium 8.6 (L) 8.9 - 10.3 mg/dL   GFR, Estimated >95 >28 mL/min    Comment: (NOTE) Calculated using the CKD-EPI Creatinine Equation (2021)    Anion gap 6 5 - 15    Comment: Performed at [facility], [address] [place], [place] [address]  Magnesium     Status: None   Collection Time: 12/26/22  4:31 AM  Result Value Ref Range   Magnesium 2.1 1.7 - 2.4 mg/dL    Comment: Performed at [facility], [address], [place], [place] [address]  Phosphorus     Status: None   Collection Time: 12/26/22  4:31 AM  Result Value Ref Range   Phosphorus 4.2 2.5 - 4.6 mg/dL    Comment: Performed at [facility], [address] [place], [place] [address]  Urinalysis, Routine w reflex microscopic -Urine, Unspecified Source     Status: Abnormal   Collection Time: 12/26/22  4:20 PM  Result Value Ref Range   Color, Urine YELLOW YELLOW   APPearance CLEAR CLEAR   Specific Gravity, Urine  1.015 1.005 - 1.030   pH 6.0 5.0 - 8.0   Glucose, UA NEGATIVE NEGATIVE mg/dL   Hgb urine dipstick NEGATIVE NEGATIVE   Bilirubin Urine NEGATIVE NEGATIVE   Ketones, ur NEGATIVE NEGATIVE mg/dL   Protein, ur NEGATIVE NEGATIVE mg/dL   Nitrite NEGATIVE NEGATIVE   Leukocytes,Ua TRACE (A) NEGATIVE   RBC / HPF 0-5 0 - 5 RBC/hpf   WBC, UA 0-5 0 - 5 WBC/hpf   Bacteria, UA MANY (A) NONE SEEN   Squamous Epithelial / HPF 0-5 0 - 5  /HPF    Comment: Performed at [facility], [address], [place], [place] [address]    DG Tibia/Fibula Left Port  Result Date: 12/26/2022 CLINICAL DATA:  Closed fracture of the tibia.  Remote fixation EXAM: PORTABLE LEFT TIBIA AND FIBULA - 2 VIEW COMPARISON:  Knee film 12/25/2022 FINDINGS: acute fracture of the proximal tibial metaphysis just superior to remote healed fracture. Fracture of the proximal fibula also superior to a healed fracture. Internal dynamic plate fixation of the distal tibia. No distal fracture dislocation noted. IMPRESSION: 1. Proximal tibial and fibular fracture. Fractures well demonstrated on comparison knee radiograph 1 day prior. 2. No acute fracture of the distal tibia fibula. Electronically Signed   By: Genevive Bi M.D.   On: 12/26/2022 13:55   DG Ankle Complete Left  Result Date: 12/25/2022 CLINICAL DATA:  Fall EXAM: LEFT ANKLE COMPLETE - 3+ VIEW COMPARISON:  None Available. FINDINGS: Lateral compression plate and screw fixation of the distal tibia. No evidence of hardware complication. Old/healed distal fibular shaft fracture. No fracture or dislocation is seen. The ankle mortise is intact. The visualized soft tissues are unremarkable. IMPRESSION: No fracture or dislocation is seen. Lateral compression plate and screw fixation of the distal tibia. No evidence of hardware complication. Electronically Signed   By: Charline Bills M.D.   On: 12/25/2022 19:51   DG Foot Complete Left  Result Date: 12/25/2022 CLINICAL DATA:  Fall EXAM: LEFT FOOT - COMPLETE 3+ VIEW COMPARISON:  None Available. FINDINGS: Status post ORIF of the distal tibia, incompletely visualized. No evidence of hardware complication. No fracture or dislocation is seen. The joint spaces are preserved.  Hallux valgus deformity. The visualized soft tissues are unremarkable. IMPRESSION: No fracture or dislocation is seen. Status post ORIF of the distal tibia, incompletely visualized. No evidence of  hardware complication. Electronically Signed   By: Charline Bills M.D.   On: 12/25/2022 19:51   DG Knee Complete 4 Views Left  Result Date: 12/25/2022 CLINICAL DATA:  Fall EXAM: LEFT KNEE - COMPLETE 4+ VIEW COMPARISON:  None Available. FINDINGS: Mildly comminuted oblique proximal tibial shaft fracture. No intra-articular extension. Healed left proximal fibular shaft fracture. However, a superimposed nondisplaced fracture involving the fibular neck is suspected. Mild anterior soft tissue swelling. IMPRESSION: Mildly comminuted oblique proximal tibial shaft fracture, as above. Suspected nondisplaced fibular neck fracture. Healed left proximal fibular shaft fracture. Electronically Signed   By: Charline Bills M.D.   On: 12/25/2022 19:50   DG Hip Unilat W or Wo Pelvis 2-3 Views Left  Result Date: 12/25/2022 CLINICAL DATA:  Fall EXAM: DG HIP (WITH OR WITHOUT PELVIS) 2-3V LEFT COMPARISON:  None Available. FINDINGS: Status post ORIF of the left hip. No evidence of hardware complication. No fracture or dislocation is seen. Mild degenerative changes of the right hip. Visualized bony pelvis appears intact. IMPRESSION: Status post ORIF of the left hip. No evidence of hardware complication. No fracture or dislocation is seen. Electronically Signed  By: Charline Bills M.D.   On: 12/25/2022 19:49    Review of Systems Blood pressure (!) 155/56, pulse 93, temperature 97.8 F (36.6 C), temperature source Oral, resp. rate 19, height 5\' 2"  (1.575 m), weight 48.9 kg, SpO2 100%. Physical Exam Vitals and nursing note reviewed. Exam conducted with a chaperone present.  Constitutional:      General: She is not in acute distress.    Appearance: She is well-groomed and underweight. She is not ill-appearing, toxic-appearing or diaphoretic.  HENT:     Head: Normocephalic and atraumatic.     Nose: No congestion or rhinorrhea.  Eyes:     General: No scleral icterus.       Right eye: No discharge.        Left  eye: No discharge.     Extraocular Movements: Extraocular movements intact.     Pupils: Pupils are equal, round, and reactive to light.  Cardiovascular:     Rate and Rhythm: Normal rate and regular rhythm.     Pulses: Normal pulses.  Pulmonary:     Effort: Pulmonary effort is normal.     Breath sounds: No wheezing.  Abdominal:     General: Abdomen is flat. There is no distension.  Musculoskeletal:        General: Tenderness present. No deformity.     Right shoulder: Normal.     Left shoulder: Normal.     Right upper arm: Normal.     Left upper arm: Normal.     Right forearm: Normal.     Left forearm: Normal.     Right wrist: Normal.     Left wrist: Normal.     Right hip: Normal.     Right upper leg: Normal.     Right knee: Normal.     Right lower leg: Normal.     Right ankle: Normal.       Legs:     Comments: Left knee immobilizer is on (opened for skin ck)  Compartments soft skin no blisters   Skin:    Capillary Refill: Capillary refill takes less than 2 seconds.  Neurological:     General: No focal deficit present.     Mental Status: She is alert. Mental status is at baseline.     Sensory: No sensory deficit.     Motor: No weakness.     Gait: Gait abnormal.     Assessment/Plan:  Xrays left knee left tib fib ankle foot hip  All imaging reviewed . No ac frx except in left tibia; also see old frx in same area   Left tibial shaft fracture closed, above ankle orif in 89 yo female with dementia  Treatment options:  Cast - likely to do poorly and not tolerate non weight bearing   Plate - will not allow early weight bearing   Nail - best choice in these circumstances, however the distal hardware poses some difficulties. How long the nail should be, does the old hardware need to be removed ? And then what if it braeks and we can't remove it?  Will consult with Dr Dallas Schimke on final plans   Surgery scheduled for weds pm    Chinmayi Mullis has agreed to let us  proceed with surgery  [phone]  Fuller Canada 12/26/2022, 7:54 PM

## 2022-12-26 NOTE — Consult Note (Addendum)
Consultation Note Date: 12/26/2022   Patient Name: Brittney Reynolds  DOB: Aug 10, 1933  MRN: [id number]  Age / Sex: 89 y.o., female  PCP: Kari Baars, MD Referring Physician: Kendell Bane, MD  Reason for Consultation: Establishing goals of care  HPI/Patient Profile: 89 y.o. female  with past medical history of dementia, seizure disorder, HTN/HLD, hypothyroid, iron deficiency anemia, GERD, chronic anxiety/depression, disfiguring arthritis bilateral hands, resident of long-term care at [facility] admitted on 12/25/2022 with unwitnessed fall at long-term care facility leading to mildly displaced comminuted oblique proximal tibial shaft fracture, suspected nondisplaced fibular neck fracture.    Clinical Assessment and Goals of Care: I have reviewed medical records including EPIC notes, labs and imaging, received report from RN, assessed the patient.  Brittney Reynolds is sitting up in the bed.  She appears acute and chronically ill and frail.  She greets me, making and somewhat keeping eye contact.  She is alert, able to tell me her name only as she has dementia.  I share that she is safe in the hospital.  She does not seem fearful.  I am not sure that she can make her basic needs known.  There is a cup of water at bedside which I offer.  She is able to hold and drink from this cup without overt signs and symptoms of aspiration.  There is no family at bedside at this time.  I share that I will reach out to her sister, and she asks which one.  She tells me that she has 3 sisters Tyler Aas, Mora Bellman, and [place].  Call to sister, Annamarie Major, at [phone].  Number not in service.   Call to niece, Tora Perches, at [phone], call cannot be completed at this time.   Call to niece, Tora Perches, cell number [phone].  Left voicemail message.   Conference with attending, surgeon, bedside nursing staff, transition  of care team related to patient condition, needs.    HCPOA  NEXT OF KIN -Brittney Reynolds tells me that she has 3 sisters.  Phone number for Sister Annamarie Major is disconnected.  Left message for niece, Tora Perches. Discussions related to HCPOA are needed    SUMMARY OF RECOMMENDATIONS   At this point continue full scope/full code by default At this point no plan for surgery as unable to reach family Anticipate return to long-term care at [facility] PMT to follow    Code Status/Advance Care Planning: Full code -by default.  Unable to have CODE STATUS discussions with Brittney Reynolds due to her memory loss.  Unable to reach family today.  PMT to follow.  Symptom Management:  Per hospitalist/surgeon.  No additional needs at this time.  Palliative Prophylaxis:  Frequent Pain Assessment, Oral Care, and Turn Reposition  Additional Recommendations (Limitations, Scope, Preferences): Full Scope Treatment  Psycho-social/Spiritual:  Desire for further Chaplaincy support:no Additional Recommendations: Caregiving  Support/Resources  Prognosis:  Unable to determine, based on outcomes.  Anticipate 6 months or less due to advanced age, chronic illness burden, long-term care resident.  Discharge Planning:  Anticipate return to long term care at [facility].        Primary Diagnoses: Present on Admission:  Fall, initial encounter   I have reviewed the medical record, interviewed the patient and family, and examined the patient. The following aspects are pertinent.  Past Medical History:  Diagnosis Date   Anemia    Anxiety    Colon polyp 5 yrs ago   in  [name]   Depression    HTN (hypertension)    Polio    As a child   Seizures (HCC)    Social History   Socioeconomic History   Marital status: Single    Spouse name: Not on file   Number of children: Not on file   Years of education: Not on file   Highest education level: Not on file  Occupational History   Not  on file  Tobacco Use   Smoking status: Never   Smokeless tobacco: Not on file  Substance and Sexual Activity   Alcohol use: No   Drug use: No   Sexual activity: Never  Other Topics Concern   Not on file  Social History Narrative   Not on file   Social Determinants of Health   Financial Resource Strain: Not on file  Food Insecurity: Patient Unable To Answer (12/26/2022)   Hunger Vital Sign    Worried About Running Out of Food in the Last Year: Patient unable to answer    Ran Out of Food in the Last Year: Patient unable to answer  Transportation Needs: Patient Unable To Answer (12/26/2022)   PRAPARE - Transportation    Lack of Transportation (Medical): Patient unable to answer    Lack of Transportation (Non-Medical): Patient unable to answer  Physical Activity: Not on file  Stress: Not on file  Social Connections: Not on file   History reviewed. No pertinent family history. Scheduled Meds:  atorvastatin  10 mg Oral Daily   ferrous sulfate  325 mg Oral BID WC   levothyroxine  100 mcg Oral Q0600   memantine  10 mg Oral BID   metoprolol succinate  12.5 mg Oral QPM   mirtazapine  7.5 mg Oral QHS   pantoprazole  40 mg Oral Daily   phenytoin  125 mg Oral q morning   primidone  200 mg Oral TID   Continuous Infusions:  sodium chloride     PRN Meds:.acetaminophen, HYDROmorphone (DILAUDID) injection, LORazepam, melatonin, oxyCODONE, polyethylene glycol, prochlorperazine Medications Prior to Admission:  Prior to Admission medications   Medication Sig Start Date End Date Taking? Authorizing Provider  ascorbic Acid (VITAMIN C) 500 MG CPCR Take 500 mg by mouth daily.   Yes [provider]  atorvastatin (LIPITOR) 10 MG tablet Take 10 mg by mouth daily. 12/14/22  Yes [provider]  Calcium Carbonate-Vitamin D (CALCIUM + D PO) Take 600 mg by mouth 2 (two) times daily.    Yes [provider]  Ensure (ENSURE) Take 237 mLs by mouth 3 (three) times daily between  meals.   Yes [provider]  ferrous sulfate 325 (65 FE) MG tablet Take 325 mg by mouth 2 (two) times daily with a meal.   Yes [provider]  HYDROcodone-acetaminophen (NORCO/VICODIN) 5-325 MG tablet Take 0.5 tablets by mouth every 6 (six) hours as needed for moderate pain (pain score 4-6). 11/23/22  Yes [provider]  levothyroxine (SYNTHROID, LEVOTHROID) 100 MCG tablet Take 100 mcg by mouth daily before breakfast.  Yes [provider]  memantine (NAMENDA) 10 MG tablet Take 10 mg by mouth 2 (two) times daily. 12/20/22  Yes [provider]  metoprolol succinate (TOPROL-XL) 25 MG 24 hr tablet Take 25 mg by mouth every evening.    Yes [provider]  mirtazapine (REMERON) 7.5 MG tablet Take 7.5 mg by mouth at bedtime. 12/09/22  Yes [provider]  Multiple Vitamins-Minerals (MULTIVITAMIN WITH MINERALS) tablet Take 1 tablet by mouth daily.   Yes [provider]  primidone (MYSOLINE) 50 MG tablet Take 200 mg by mouth 3 (three) times daily.   Yes [provider]  senna (SENOKOT) 8.6 MG tablet Take 1 tablet by mouth daily.   Yes [provider]  cephALEXin (KEFLEX) 500 MG capsule Take 1 capsule (500 mg total) by mouth 3 (three) times daily. 09/23/19   Devoria Albe, MD  diltiazem (TIAZAC) 360 MG 24 hr capsule Take 360 mg by mouth daily. 10/23/12   [provider]  donepezil (ARICEPT) 5 MG tablet Take 5 mg by mouth at bedtime.    [provider]  escitalopram (LEXAPRO) 10 MG tablet Take 10 mg by mouth daily.      [provider]  LORazepam (ATIVAN) 2 MG/ML concentrated solution Take 1 mg by mouth every 6 (six) hours as needed for anxiety or seizure.    [provider]  losartan-hydrochlorothiazide (HYZAAR) 100-12.5 MG per tablet Take 1 tablet by mouth daily. 05/09/13   [provider]  nitrofurantoin, macrocrystal-monohydrate, (MACROBID) 100 MG capsule Take 1 capsule (100 mg  total) by mouth 2 (two) times daily. For 7 days 02/28/15   Triplett, Tammy, PA-C  oxybutynin (DITROPAN-XL) 5 MG 24 hr tablet Take 5 mg by mouth daily. 10/16/12   [provider]  pantoprazole (PROTONIX) 40 MG tablet Take 40 mg by mouth daily.    [provider]  phenytoin (DILANTIN) 125 MG/5ML suspension Take 125 mg by mouth every morning.    [provider]  polyethylene glycol powder (GLYCOLAX/MIRALAX) powder Take 17 g by mouth daily. 04/15/13   [provider]  raloxifene (EVISTA) 60 MG tablet Take 60 mg by mouth daily.      [provider]   Allergies  Allergen Reactions   Acetaminophen     Patient was instructed to only take Ibuprofen by MD   Milk-Related Compounds Other (See Comments)    Unknown    Review of Systems  Unable to perform ROS: Age    Physical Exam Vitals and nursing note reviewed.     Vital Signs: BP (!) 151/68   Pulse 98   Temp 97.6 F (36.4 C) (Oral)   Resp 18   Ht 5\' 2"  (1.575 m)   Wt 48.9 kg   SpO2 99%   BMI 19.72 kg/m  Pain Scale: PAINAD       SpO2: SpO2: 99 % O2 Device:SpO2: 99 % O2 Flow Rate: .   IO: Intake/output summary: No intake or output data in the 24 hours ending 12/26/22 0844  LBM:   Baseline Weight: Weight: 68 kg Most recent weight: Weight: 48.9 kg     Palliative Assessment/Data:     Time In: 1150  Time Out: 1230 Time Total: 40 minutes  Greater than 50%  of this time was spent counseling and coordinating care related to the above assessment and plan.  Signed by: Katheran Awe, NP   Please contact Palliative Medicine Team phone at [phone] for questions and concerns.  For individual  provider: See Loretha Stapler

## 2022-12-26 NOTE — Progress Notes (Signed)
Pt has been alert and oriented x1 this shift. Pt has pulled out PW and removed her tele monitor multiple times today. Pt is easy to redirect. Pt removed her IV and had a new one replaced. Pt has received PRN pain and anxiety medication this shift. All fall precautions are currently in place. Pt brother and [place] has called this nurse several times for updates. Dr. Romeo Apple talked to pt and niece bedside about plans for surgery. Pt has breakfast and lunch today. Pt has also had a bed bath today. Call bell within reach and bed alarm is on.

## 2022-12-26 NOTE — Progress Notes (Signed)
Patient arrived to room 314 via stretcher from the ED.  Patient slid from stretcher to bed by staff.  Patient's diaper and linens underneath her totally soaked with urine. Patient was cleaned and linen was replaced w/clean linen.  Patient is only alert to self.  Patient is very figidity, and irritable.  Patient was pulling at her IV and was pulling her knee brace off. Patient has fractures to her LLE so a purewick was placed and the patient kept trying to remove it.  Mittens were placed on the patient's hands to deter her from pulling at things.  Patient was continually biting at and pulling on the mittens and screaming to get them off.  Explained to patient that she needed to leave things along and I explained why however, this patient has dementia and was not understanding or following commands.  Patient was given PRN dose of Ativan and she did calm down and go to sleep.  Placed patient on 2L [place] due to the Ativan.

## 2022-12-26 NOTE — Progress Notes (Signed)
PROGRESS NOTE    Patient: Brittney Reynolds                            PCP: Kari Baars, MD                    DOB: August 11, 1933            DOA: 12/25/2022 ZOX:[id number]             DOS: 12/26/2022, 10:04 AM   LOS: 1 day   Date of Service: The patient was seen and examined on 12/26/2022  Subjective:   The patient was seen and examined this morning. Hemodynamically stable. Pleasantly confused, cooperative No acute distress  Brief Narrative:   ROBECCA HAMACHER is a 89 y.o. female with medical history significant for dementia, seizure disorder, chronic anxiety/depression, hypertension, hyperlipidemia, hypothyroidism, iron deficiency anemia, GERD, who presented after an unwitnessed fall at SNF.   In the ED, alert and confused.  Unable to provide a reliable history due to underlying advanced dementia.   ED Course: Temperature 98.1.  BP 181/87, pulse 83, respiratory 17, saturation 98% on room air.  Lab studies notable for WBC 10.6, hemoglobin 11.0, platelet count 191, neutrophil count 8.5.  Serum glucose 110, albumin 3.4.  UA pending.  Imaging revealed mildly comminuted oblique proximal tibial shaft fracture, and suspected nondisplaced fibular neck fracture. Healed left proximal fibular shaft fracture.   EDP discussed the case with orthopedic surgery who will see in consultation.   Admitted by [facility], hospitalist service.    Assessment/Plan    Left mildly comminuted oblique proximal tibial shaft fracture, and suspected nondisplaced fibular neck fracture. -Unwitnessed fall, POA -Continue pain management, -Hemodynamically stable - Orthopedic surgery consulted-appreciate Dr. Romeo Apple follow-up No surgical invention today, continue diet, n.p.o. after midnight - Immobilizer and fall precautions   Dementia with behavioral disturbances -Pleasantly confused, at baseline Resume home regimen Reorient as needed Fall precautions   Seizure disorder Resume home AEDs Seizure  precautions   Acute blood loss anemia in the setting of fracture Continue to monitor H&H   Hypothyroidism Resume home levothyroxine   GERD Resume home PPI   Iron deficiency anemia Resume home iron supplement   Hyperlipidemia Resume home Lipitor.   Hyperglycemia, mild Gentle IV fluid hydration NS at 40 cc/h x 1 day    ----------------------------------------------------------------------------------------------------------------------------------------------- Nutritional status:  The patient's BMI is: Body mass index is 19.72 kg/m. I agree with the assessment and plan as outlined ------------------------------------------------------------------------------------------------------------------------------------------------  DVT prophylaxis:  heparin injection 5,000 Units Start: 12/26/22 1400 SCDs Start: 12/26/22 1001 SCDs Start: 12/26/22 0321   Code Status:   Code Status: Full Code  Family Communication: No family member present at bedside- attempt will be made to update daily  -Advance care planning has been discussed.   Admission status:   Status is: Inpatient Remains inpatient appropriate because: Will need surgical intervention   Disposition: From  - home             Planning for discharge in 2 days: to SNF  Procedures:   No admission procedures for hospital encounter.   Antimicrobials:  Anti-infectives (From admission, onward)    None        Medication:   atorvastatin  10 mg Oral Daily   ferrous sulfate  325 mg Oral BID WC   heparin  5,000 Units Subcutaneous Q8H   levothyroxine  100 mcg Oral Q0600   memantine  10 mg  Oral BID   metoprolol succinate  12.5 mg Oral QPM   mirtazapine  7.5 mg Oral QHS   pantoprazole  40 mg Oral Daily   phenytoin  125 mg Oral q morning   primidone  200 mg Oral TID    acetaminophen, HYDROmorphone (DILAUDID) injection, LORazepam, melatonin, oxyCODONE, polyethylene glycol, prochlorperazine   Objective:    Vitals:   12/26/22 0147 12/26/22 0157 12/26/22 0535 12/26/22 0554  BP:  (!) 151/68 (!) 151/53 (!) 151/68  Pulse:  98 80 98  Resp:  18 18   Temp:   97.6 F (36.4 C)   TempSrc:   Oral   SpO2:  99% 99%   Weight: 49.8 kg 48.9 kg    Height: 5\' 2"  (1.575 m) 5\' 2"  (1.575 m)     No intake or output data in the 24 hours ending 12/26/22 1004 Filed Weights   12/25/22 1848 12/26/22 0147 12/26/22 0157  Weight: 68 kg 49.8 kg 48.9 kg     Physical examination:   Constitution:  Alert, cooperative, no distress,  Appears calm and comfortable  Psychiatric:   Normal and stable mood and affect, cognition intact,   HEENT:        Normocephalic, PERRL, otherwise with in Normal limits  Chest:         Chest symmetric Cardio vascular:  S1/S2, RRR, No murmure, No Rubs or Gallops  pulmonary: Clear to auscultation bilaterally, respirations unlabored, negative wheezes / crackles Abdomen: Soft, non-tender, non-distended, bowel sounds,no masses, no organomegaly Muscular skeletal: Left leg in immobilizer, limited range of motion due to pain  Limited exam - in bed, able to move all other 3 extremities,   Neuro: CNII-XII intact. , normal motor and sensation, reflexes intact  Extremities: No pitting edema lower extremities, +2 pulses  Skin: Dry, warm to touch, negative for any Rashes, No open wounds Wounds: per nursing documentation   ------------------------------------------------------------------------------------------------------------------------------------------    LABs:     Latest Ref Rng & Units 12/26/2022    4:31 AM 12/25/2022   11:45 PM 12/25/2022   11:13 PM  CBC  WBC 4.0 - 10.5 K/uL 10.3  10.6    Hemoglobin 12.0 - 15.0 g/dL 16.1  09.6  04.5   Hematocrit 36.0 - 46.0 % 32.1  33.1  36.0   Platelets 150 - 400 K/uL 218  191        Latest Ref Rng & Units 12/26/2022    4:31 AM 12/25/2022   11:45 PM 12/25/2022   11:13 PM  CMP  Glucose 70 - 99 mg/dL 409  811  914   BUN 8 - 23 mg/dL 15  14  15     Creatinine 0.44 - 1.00 mg/dL 7.82  9.56  2.13   Sodium 135 - 145 mmol/L 139  137  140   Potassium 3.5 - 5.1 mmol/L 4.5  3.8  3.5   Chloride 98 - 111 mmol/L 104  104  102   CO2 22 - 32 mmol/L 29  25    Calcium 8.9 - 10.3 mg/dL 8.6  8.5    Total Protein 6.5 - 8.1 g/dL  6.3    Total Bilirubin <1.2 mg/dL  0.6    Alkaline Phos 38 - 126 U/L  104    AST 15 - 41 U/L  26    ALT 0 - 44 U/L  12         Micro Results No results found for this or any previous visit (from the past 240 hour(s)).  Radiology Reports DG Ankle Complete Left  Result Date: 12/25/2022 CLINICAL DATA:  Fall EXAM: LEFT ANKLE COMPLETE - 3+ VIEW COMPARISON:  None Available. FINDINGS: Lateral compression plate and screw fixation of the distal tibia. No evidence of hardware complication. Old/healed distal fibular shaft fracture. No fracture or dislocation is seen. The ankle mortise is intact. The visualized soft tissues are unremarkable. IMPRESSION: No fracture or dislocation is seen. Lateral compression plate and screw fixation of the distal tibia. No evidence of hardware complication. Electronically Signed   By: Charline Bills M.D.   On: 12/25/2022 19:51   DG Foot Complete Left  Result Date: 12/25/2022 CLINICAL DATA:  Fall EXAM: LEFT FOOT - COMPLETE 3+ VIEW COMPARISON:  None Available. FINDINGS: Status post ORIF of the distal tibia, incompletely visualized. No evidence of hardware complication. No fracture or dislocation is seen. The joint spaces are preserved.  Hallux valgus deformity. The visualized soft tissues are unremarkable. IMPRESSION: No fracture or dislocation is seen. Status post ORIF of the distal tibia, incompletely visualized. No evidence of hardware complication. Electronically Signed   By: Charline Bills M.D.   On: 12/25/2022 19:51   DG Knee Complete 4 Views Left  Result Date: 12/25/2022 CLINICAL DATA:  Fall EXAM: LEFT KNEE - COMPLETE 4+ VIEW COMPARISON:  None Available. FINDINGS: Mildly comminuted oblique  proximal tibial shaft fracture. No intra-articular extension. Healed left proximal fibular shaft fracture. However, a superimposed nondisplaced fracture involving the fibular neck is suspected. Mild anterior soft tissue swelling. IMPRESSION: Mildly comminuted oblique proximal tibial shaft fracture, as above. Suspected nondisplaced fibular neck fracture. Healed left proximal fibular shaft fracture. Electronically Signed   By: Charline Bills M.D.   On: 12/25/2022 19:50   DG Hip Unilat W or Wo Pelvis 2-3 Views Left  Result Date: 12/25/2022 CLINICAL DATA:  Fall EXAM: DG HIP (WITH OR WITHOUT PELVIS) 2-3V LEFT COMPARISON:  None Available. FINDINGS: Status post ORIF of the left hip. No evidence of hardware complication. No fracture or dislocation is seen. Mild degenerative changes of the right hip. Visualized bony pelvis appears intact. IMPRESSION: Status post ORIF of the left hip. No evidence of hardware complication. No fracture or dislocation is seen. Electronically Signed   By: Charline Bills M.D.   On: 12/25/2022 19:49    SIGNED: Kendell Bane, MD, FHM. FAAFP. [facility] - [facility] Time spent - 55 min.  In seeing, evaluating and examining the patient. Reviewing medical records, labs, drawn plan of care. [facility],  Pager (please use [email or web address] to page/ text) Please use Epic Secure Chat for non-urgent communication (7AM-7PM)  If 7PM-7AM, please contact night-coverage [email or web address], 12/26/2022, 10:04 AM

## 2022-12-26 NOTE — H&P (Addendum)
History and Physical  Brittney Reynolds:[id number] DOB: February 28, 1933 DOA: 12/25/2022  Referring physician: Dr. Estell Harpin, EDP  PCP: Kari Baars, MD  Outpatient Specialists: None Patient coming from: SNF  Chief Complaint: Fall   HPI: Brittney Reynolds is a 89 y.o. female with medical history significant for dementia, seizure disorder, chronic anxiety/depression, hypertension, hyperlipidemia, hypothyroidism, iron deficiency anemia, GERD, who presented after an unwitnessed fall at [facility].  In the ED, alert and confused.  Unable to provide a reliable history due to underlying advanced dementia.  Imaging revealed mildly comminuted oblique proximal tibial shaft fracture, and suspected nondisplaced fibular neck fracture. Healed left proximal fibular shaft fracture.  EDP discussed the case with orthopedic surgery who will see in consultation.  Admitted by [facility], hospitalist service.  ED Course: Temperature 98.1.  BP 181/87, pulse 83, respiratory 17, saturation 98% on room air.  Lab studies notable for WBC 10.6, hemoglobin 11.0, platelet count 191, neutrophil count 8.5.  Serum glucose 110, albumin 3.4.  UA pending.  Review of Systems: Review of systems as noted in the HPI. All other systems reviewed and are negative.   Past Medical History:  Diagnosis Date   Anemia    Anxiety    Colon polyp 5 yrs ago   in  [name]   Depression    HTN (hypertension)    Polio    As a child   Seizures [facility])    Past Surgical History:  Procedure Laterality Date   FOOT SURGERY  11/11   Left foot   OTIF  2009   Right ankle    Social History:  reports that she has never smoked. She does not have any smokeless tobacco history on file. She reports that she does not drink alcohol and does not use drugs.   Allergies  Allergen Reactions   Acetaminophen     Patient was instructed to only take Ibuprofen by MD   Milk-Related Compounds Other (See Comments)    Unknown     Family history: None  reported.  Prior to Admission medications   Medication Sig Start Date End Date Taking? Authorizing Provider  ascorbic Acid (VITAMIN C) 500 MG CPCR Take 500 mg by mouth daily.   Yes [provider]  atorvastatin (LIPITOR) 10 MG tablet Take 10 mg by mouth daily. 12/14/22  Yes [provider]  Calcium Carbonate-Vitamin D (CALCIUM + D PO) Take 600 mg by mouth 2 (two) times daily.    Yes [provider]  Ensure (ENSURE) Take 237 mLs by mouth 3 (three) times daily between meals.   Yes [provider]  ferrous sulfate 325 (65 FE) MG tablet Take 325 mg by mouth 2 (two) times daily with a meal.   Yes [provider]  HYDROcodone-acetaminophen (NORCO/VICODIN) 5-325 MG tablet Take 0.5 tablets by mouth every 6 (six) hours as needed for moderate pain (pain score 4-6). 11/23/22  Yes [provider]  levothyroxine (SYNTHROID, LEVOTHROID) 100 MCG tablet Take 100 mcg by mouth daily before breakfast.   Yes [provider]  memantine (NAMENDA) 10 MG tablet Take 10 mg by mouth 2 (two) times daily. 12/20/22  Yes [provider]  metoprolol succinate (TOPROL-XL) 25 MG 24 hr tablet Take 25 mg by mouth every evening.    Yes [provider]  mirtazapine (REMERON) 7.5 MG tablet Take 7.5 mg by mouth at bedtime. 12/09/22  Yes [provider]  Multiple Vitamins-Minerals (MULTIVITAMIN WITH MINERALS) tablet Take 1 tablet by mouth daily.  Yes [provider]  primidone (MYSOLINE) 50 MG tablet Take 200 mg by mouth 3 (three) times daily.   Yes [provider]  senna (SENOKOT) 8.6 MG tablet Take 1 tablet by mouth daily.   Yes [provider]  cephALEXin (KEFLEX) 500 MG capsule Take 1 capsule (500 mg total) by mouth 3 (three) times daily. 09/23/19   Devoria Albe, MD  diltiazem (TIAZAC) 360 MG 24 hr capsule Take 360 mg by mouth daily. 10/23/12   [provider]  donepezil (ARICEPT) 5 MG tablet Take 5 mg by mouth at  bedtime.    [provider]  escitalopram (LEXAPRO) 10 MG tablet Take 10 mg by mouth daily.      [provider]  LORazepam (ATIVAN) 2 MG/ML concentrated solution Take 1 mg by mouth every 6 (six) hours as needed for anxiety or seizure.    [provider]  losartan-hydrochlorothiazide (HYZAAR) 100-12.5 MG per tablet Take 1 tablet by mouth daily. 05/09/13   [provider]  nitrofurantoin, macrocrystal-monohydrate, (MACROBID) 100 MG capsule Take 1 capsule (100 mg total) by mouth 2 (two) times daily. For 7 days 02/28/15   Triplett, Tammy, PA-C  oxybutynin (DITROPAN-XL) 5 MG 24 hr tablet Take 5 mg by mouth daily. 10/16/12   [provider]  pantoprazole (PROTONIX) 40 MG tablet Take 40 mg by mouth daily.    [provider]  phenytoin (DILANTIN) 125 MG/5ML suspension Take 125 mg by mouth every morning.    [provider]  polyethylene glycol powder (GLYCOLAX/MIRALAX) powder Take 17 g by mouth daily. 04/15/13   [provider]  raloxifene (EVISTA) 60 MG tablet Take 60 mg by mouth daily.      [provider]    Physical Exam: BP (!) 151/68 (BP Location: Right Arm)   Pulse 98   Temp 98.1 F (36.7 C) (Oral)   Resp 18   Ht 5\' 2"  (1.575 m)   Wt 48.9 kg   SpO2 99%   BMI 19.72 kg/m   General: 89 y.o. year-old female well developed well nourished in no acute distress.  Alert and confused. Cardiovascular: Regular rate and rhythm with no rubs or gallops.  No thyromegaly or JVD noted.  Trace lower extremity edema bilaterally. Respiratory: Clear to auscultation with no wheezes or rales.  Poor inspiratory effort. Abdomen: Soft nontender nondistended with normal bowel sounds x4 quadrants. Muskuloskeletal: No cyanosis, clubbing or edema noted bilaterally Neuro: CN II-XII intact, strength, sensation, reflexes Skin: No ulcerative lesions noted or rashes Psychiatry: Judgement and insight appear altered. Mood is appropriate for  condition and setting          Labs on Admission:  Basic Metabolic Panel: Recent Labs  Lab 12/25/22 2313 12/25/22 2345  NA 140 137  K 3.5 3.8  CL 102 104  CO2  --  25  GLUCOSE 105* 110*  BUN 15 14  CREATININE 0.70 0.68  CALCIUM  --  8.5*   Liver Function Tests: Recent Labs  Lab 12/25/22 2345  AST 26  ALT 12  ALKPHOS 104  BILITOT 0.6  PROT 6.3*  ALBUMIN 3.4*   No results for input(s): "LIPASE", "AMYLASE" in the last 168 hours. No results for input(s): "AMMONIA" in the last 168 hours. CBC: Recent Labs  Lab 12/25/22 2313 12/25/22 2345  WBC  --  10.6*  NEUTROABS  --  8.5*  HGB 12.2 11.0*  HCT 36.0 33.1*  MCV  --  91.7  PLT  --  191  Cardiac Enzymes: No results for input(s): "CKTOTAL", "CKMB", "CKMBINDEX", "TROPONINI" in the last 168 hours.  BNP (last 3 results) No results for input(s): "BNP" in the last 8760 hours.  ProBNP (last 3 results) No results for input(s): "PROBNP" in the last 8760 hours.  CBG: No results for input(s): "GLUCAP" in the last 168 hours.  Radiological Exams on Admission: DG Ankle Complete Left  Result Date: 12/25/2022 CLINICAL DATA:  Fall EXAM: LEFT ANKLE COMPLETE - 3+ VIEW COMPARISON:  None Available. FINDINGS: Lateral compression plate and screw fixation of the distal tibia. No evidence of hardware complication. Old/healed distal fibular shaft fracture. No fracture or dislocation is seen. The ankle mortise is intact. The visualized soft tissues are unremarkable. IMPRESSION: No fracture or dislocation is seen. Lateral compression plate and screw fixation of the distal tibia. No evidence of hardware complication. Electronically Signed   By: Charline Bills M.D.   On: 12/25/2022 19:51   DG Foot Complete Left  Result Date: 12/25/2022 CLINICAL DATA:  Fall EXAM: LEFT FOOT - COMPLETE 3+ VIEW COMPARISON:  None Available. FINDINGS: Status post ORIF of the distal tibia, incompletely visualized. No evidence of hardware complication. No  fracture or dislocation is seen. The joint spaces are preserved.  Hallux valgus deformity. The visualized soft tissues are unremarkable. IMPRESSION: No fracture or dislocation is seen. Status post ORIF of the distal tibia, incompletely visualized. No evidence of hardware complication. Electronically Signed   By: Charline Bills M.D.   On: 12/25/2022 19:51   DG Knee Complete 4 Views Left  Result Date: 12/25/2022 CLINICAL DATA:  Fall EXAM: LEFT KNEE - COMPLETE 4+ VIEW COMPARISON:  None Available. FINDINGS: Mildly comminuted oblique proximal tibial shaft fracture. No intra-articular extension. Healed left proximal fibular shaft fracture. However, a superimposed nondisplaced fracture involving the fibular neck is suspected. Mild anterior soft tissue swelling. IMPRESSION: Mildly comminuted oblique proximal tibial shaft fracture, as above. Suspected nondisplaced fibular neck fracture. Healed left proximal fibular shaft fracture. Electronically Signed   By: Charline Bills M.D.   On: 12/25/2022 19:50   DG Hip Unilat W or Wo Pelvis 2-3 Views Left  Result Date: 12/25/2022 CLINICAL DATA:  Fall EXAM: DG HIP (WITH OR WITHOUT PELVIS) 2-3V LEFT COMPARISON:  None Available. FINDINGS: Status post ORIF of the left hip. No evidence of hardware complication. No fracture or dislocation is seen. Mild degenerative changes of the right hip. Visualized bony pelvis appears intact. IMPRESSION: Status post ORIF of the left hip. No evidence of hardware complication. No fracture or dislocation is seen. Electronically Signed   By: Charline Bills M.D.   On: 12/25/2022 19:49    EKG: I independently viewed the EKG done and my findings are as followed: Normal sinus rhythm rate of 93.  Nonspecific ST-T changes.  QTc 442.  Assessment/Plan Present on Admission:  Fall, initial encounter  Principal Problem:   Fall, initial encounter  Unwitnessed fall, POA Left mildly comminuted oblique proximal tibial shaft fracture, and  suspected nondisplaced fibular neck fracture. Orthopedic surgery consulted by EDP. Analgesics as needed Immobilizer and fall precautions  Dementia with behavioral disturbances Resume home regimen Reorient as needed Fall precautions  Seizure disorder Resume home AEDs Seizure precautions  Acute blood loss anemia in the setting of fracture Hemoglobin 11.0 Continue to monitor H&H  Hypothyroidism Resume home levothyroxine  GERD Resume home PPI  Iron deficiency anemia Resume home iron supplement  Hyperlipidemia Resume home Lipitor.  Hyperglycemia, mild Gentle IV fluid hydration NS at 40 cc/h x 1 day  Time: 65 minutes.   DVT prophylaxis: SCDs.  Defer pharmacological DVT prophylaxis to orthopedic surgery.  Code Status: Full code by default, no family members at bedside.  Family Communication: None at bedside.  Disposition Plan: Admitted to telemetry unit  Consults called: Orthopedic surgery consulted by EDP.  Admission status: Inpatient status.   Status is: Inpatient The patient requires at least 2 midnights for further evaluation and treatment of present condition.   Darlin Drop MD [facility] Pager [phone]  If 7PM-7AM, please contact night-coverage [email or web address] Password [email or web address]  12/26/2022, 3:20 AM

## 2022-12-27 DIAGNOSIS — W19XXXA Unspecified fall, initial encounter: Secondary | ICD-10-CM | POA: Diagnosis not present

## 2022-12-27 DIAGNOSIS — Z515 Encounter for palliative care: Secondary | ICD-10-CM | POA: Diagnosis not present

## 2022-12-27 DIAGNOSIS — G40909 Epilepsy, unspecified, not intractable, without status epilepticus: Secondary | ICD-10-CM | POA: Diagnosis not present

## 2022-12-27 DIAGNOSIS — Z7189 Other specified counseling: Secondary | ICD-10-CM | POA: Diagnosis not present

## 2022-12-27 LAB — CBC
HCT: 30.6 % — ABNORMAL LOW (ref 36.0–46.0)
Hemoglobin: 9.8 g/dL — ABNORMAL LOW (ref 12.0–15.0)
MCH: 30 pg (ref 26.0–34.0)
MCHC: 32 g/dL (ref 30.0–36.0)
MCV: 93.6 fL (ref 80.0–100.0)
Platelets: 192 10*3/uL (ref 150–400)
RBC: 3.27 MIL/uL — ABNORMAL LOW (ref 3.87–5.11)
RDW: 12.7 % (ref 11.5–15.5)
WBC: 7.7 10*3/uL (ref 4.0–10.5)
nRBC: 0 % (ref 0.0–0.2)

## 2022-12-27 MED ORDER — ATORVASTATIN CALCIUM 10 MG PO TABS: 10.0000 mg | ORAL_TABLET | Freq: Every day | ORAL | Status: DC

## 2022-12-27 MED ORDER — TRANEXAMIC ACID-NACL 1000-0.7 MG/100ML-% IV SOLN: 1000.0000 mg | INTRAVENOUS | Status: DC

## 2022-12-27 MED ORDER — CEFAZOLIN SODIUM-DEXTROSE 2-4 GM/100ML-% IV SOLN
2.0000 g | INTRAVENOUS | Status: DC
Start: 2022-12-28 — End: 2022-12-29

## 2022-12-27 NOTE — Progress Notes (Signed)
Palliative: Ms. Lamar is lying quietly in bed.  She appears acutely/chronically ill and very frail.  She will briefly make but not keep eye contact.  Mrs. Lamphier has known dementia, and does not tell me her name.  She seems "fidgety", and clearly cannot make her needs known.  She declines food or drink at this time.  There is no family at bedside at this time.  Ms. Aukamp seems concerned about the protective sock on her right arm which is protecting her IV site.  She states discomfort in her right hip.  She is adjusted in bed.  Left knee immobilizer in place with swelling noted to left foot.  Complaint of pain when left foot manipulated.   First call to niece, Tora Perches, who lives locally.  Marylu Lund states that she saw her aunt yesterday and spoke with her uncle Gerlene Burdock.  At this point, the plan is for surgical repair 12/11 afternoon.  Marylu Lund shares that Ms. Alfred Levins, has had a decline over the last year, in particular with weight loss and mobility.  Marylu Lund shares that Ms. Yandow lived a sheltered life at home.  She suffered with polio and other childhood illnesses and lived with her mother until about 17 years ago when Aayana's mother died.  At that point [place] moved in with her sister, Janet's mother.  Riha has been in long-term care for approximately 4 to 5 years.  Marylu Lund shares that [place] never married and had no children.  We talk about the chronic illness pathway for memory loss including, but not limited to mental status changes, physical/functional changes, nutritional changes.  Marylu Lund shares that she has seen a withdrawing in [place], she no longer attends functions at the nursing home, she is also mostly wheelchair-bound transferring only.    We talk about HCPOA.  Clyde never completed paperwork.  Legally, the majority of her brothers and sisters would be her healthcare surrogates.  Marylu Lund shares that brother Gerlene Burdock, who lives in [place], calls [place] twice per  day at the nursing home.   We talk about CODE STATUS.  Marylu Lund states that they have never discussed nor considered CODE STATUS.   We talk about what this time looks like and feels like for [place].  I share my worries about meaningful recovery for people with memory loss.  When asked what worried her, Marylu Lund states that she was "not happy with the nursing home as the clothes she wore stunk like pee".   Call to brother, Taje Lill.  Left somewhat detailed voicemail message.  Conference with attending, surgeon, bedside nursing staff, transition of care team related to patient condition, needs, goals of care, disposition.  Plan: At this point full scope/full code.  Plan for surgical repair left lower extremity 12/11 in the afternoon.  Anticipate return to long-term care at [facility] where she has been for approximately 4 years.  60 minutes  Derald Macleod, NP Palliative medicine team Team phone [phone]

## 2022-12-27 NOTE — Plan of Care (Signed)
Pt is alert and oriented to self.  Med compliant. Meds whole.  Currently NPO per MD notes surgery Wed. Possible early pending surgery for NPO.  NWB to left leg. Brace in place. Pulses positive.  Pt refused pain medication.  Pt removed IV and new 22 g inserted to right wrist and NS@50  infusing.  Problem: Education: Goal: Knowledge of General Education information will improve Description: Including pain rating scale, medication(s)/side effects and non-pharmacologic comfort measures Outcome: Progressing   Problem: Health Behavior/Discharge Planning: Goal: Ability to manage health-related needs will improve Outcome: Progressing   Problem: Clinical Measurements: Goal: Ability to maintain clinical measurements within normal limits will improve Outcome: Progressing Goal: Will remain free from infection Outcome: Progressing Goal: Diagnostic test results will improve Outcome: Progressing Goal: Respiratory complications will improve Outcome: Progressing Goal: Cardiovascular complication will be avoided Outcome: Progressing   Problem: Activity: Goal: Risk for activity intolerance will decrease Outcome: Progressing   Problem: Nutrition: Goal: Adequate nutrition will be maintained Outcome: Progressing   Problem: Coping: Goal: Level of anxiety will decrease Outcome: Progressing   Problem: Elimination: Goal: Will not experience complications related to bowel motility Outcome: Progressing Goal: Will not experience complications related to urinary retention Outcome: Progressing   Problem: Pain Management: Goal: General experience of comfort will improve Outcome: Progressing   Problem: Safety: Goal: Ability to remain free from injury will improve Outcome: Progressing   Problem: Skin Integrity: Goal: Risk for impaired skin integrity will decrease Outcome: Progressing

## 2022-12-27 NOTE — Progress Notes (Signed)
Consent obtained by this nurse and charge via phone.

## 2022-12-27 NOTE — Progress Notes (Signed)
Attempt made to call pts brother Gerlene Burdock to get consent for surgery and was unsuccessful. Will attempt again later

## 2022-12-27 NOTE — Progress Notes (Signed)
Pts brother called to let us know he did not want the pt have the surgery tomorrow. He states that she is too old and he doesn't want her to remain in pain. Mds notified.

## 2022-12-27 NOTE — Progress Notes (Signed)
PROGRESS NOTE    Patient: Brittney Reynolds                            PCP: Kari Baars, MD                    DOB: Oct 14, 1933            DOA: 12/25/2022 [id number]             DOS: 12/27/2022, 11:28 AM   LOS: 2 days   Date of Service: The patient was seen and examined on 12/27/2022  Subjective:   The patient was seen and examined, laying in bed comfortably, pleasantly confused, hemodynamically stable. No issues overnight Pain well-managed   Brief Narrative:   Brittney Reynolds is a 89 y.o. female with medical history significant for dementia, seizure disorder, chronic anxiety/depression, hypertension, hyperlipidemia, hypothyroidism, iron deficiency anemia, GERD, who presented after an unwitnessed fall at SNF.   In the ED, alert and confused.  Unable to provide a reliable history due to underlying advanced dementia.   ED Course: Temperature 98.1.  BP 181/87, pulse 83, respiratory 17, saturation 98% on room air.  Lab studies notable for WBC 10.6, hemoglobin 11.0, platelet count 191, neutrophil count 8.5.  Serum glucose 110, albumin 3.4.  UA pending.  Imaging revealed mildly comminuted oblique proximal tibial shaft fracture, and suspected nondisplaced fibular neck fracture. Healed left proximal fibular shaft fracture.   EDP discussed the case with orthopedic surgery who will see in consultation.   Admitted by [facility], hospitalist service.    Assessment/Plan   Left tip/fib fracture Left mildly comminuted oblique proximal tibial shaft fracture, and suspected nondisplaced fibular neck fracture. -Unwitnessed fall, POA -Continue pain management, -Hemodynamically stable - Orthopedic surgery consulted-appreciate Dr. Romeo Apple follow-up  -Currently plan for surgical invention on Wednesday, 12/28/2022   Dementia with behavioral disturbances -Mentally stable, pleasantly confused, at baseline -Continue meds Reorient as needed Fall precautions   Seizure disorder Resume home  AEDs Seizure precautions   Acute blood loss anemia in the setting of fracture Continue to monitor H&H  -Stable   Hypothyroidism Resume home levothyroxine   GERD Resume home PPI   Iron deficiency anemia Resume home iron supplement   Hyperlipidemia Resume home Lipitor.   Hyperglycemia, mild Gentle IV fluid hydration NS at 40 cc/h x 1 day    ----------------------------------------------------------------------------------------------------------------------------------------------- Nutritional status:  The patient's BMI is: Body mass index is 19.72 kg/m. I agree with the assessment and plan as outlined ------------------------------------------------------------------------------------------------------------------------------------------------  DVT prophylaxis:  heparin injection 5,000 Units Start: 12/26/22 1400 SCDs Start: 12/26/22 1001 SCDs Start: 12/26/22 0321   Code Status:   Code Status: Full Code  Family Communication:  No family member present at bedside- attempt will be made to update -difficult to reach family POA - deceased   Admission status:   Status is: Inpatient Remains inpatient appropriate because: Will need surgical intervention   Disposition: From  - home             Planning for discharge in 2 days: to SNF  Procedures:   No admission procedures for hospital encounter.   Antimicrobials:  Anti-infectives (From admission, onward)    None        Medication:   atorvastatin  10 mg Oral Daily   ferrous sulfate  325 mg Oral BID WC   heparin  5,000 Units Subcutaneous Q8H   levothyroxine  100 mcg Oral Q0600  memantine  10 mg Oral BID   metoprolol succinate  12.5 mg Oral QPM   mirtazapine  7.5 mg Oral QHS   pantoprazole  40 mg Oral Daily   primidone  200 mg Oral TID    HYDROmorphone (DILAUDID) injection, LORazepam, melatonin, polyethylene glycol, prochlorperazine   Objective:   Vitals:   12/26/22 0554 12/26/22 1311 12/26/22  2115 12/27/22 0528  BP: (!) 151/68 (!) 155/56 (!) 157/72 (!) 129/56  Pulse: 98 93 99 83  Resp:  19 20 16   Temp:  97.8 F (36.6 C) 97.8 F (36.6 C) 98.1 F (36.7 C)  TempSrc:  Oral Oral Oral  SpO2:  100% 100% 97%  Weight:      Height:        Intake/Output Summary (Last 24 hours) at 12/27/2022 1128 Last data filed at 12/27/2022 0500 Gross per 24 hour  Intake 1094.98 ml  Output 600 ml  Net 494.98 ml   Filed Weights   12/25/22 1848 12/26/22 0147 12/26/22 0157  Weight: 68 kg 49.8 kg 48.9 kg     Physical examination:        General:  Pleasantly confused, AAO x 1,  cooperative, no distress;   HEENT:  Normocephalic, PERRL, otherwise with in Normal limits   Neuro:  CNII-XII intact. , normal motor and sensation, reflexes intact   Lungs:   Clear to auscultation BL, Respirations unlabored,  No wheezes / crackles  Cardio:    S1/S2, RRR, No murmure, No Rubs or Gallops   Abdomen:  Soft, non-tender, bowel sounds active all four quadrants, no guarding or peritoneal signs.  Muscular  skeletal:  Limited exam -global generalized weaknesses Left lower extremity immobilized, in a splint - in bed, able to move all other 3 extremities,   2+ pulses,  symmetric, No pitting edema  Skin:  Dry, warm to touch, negative for any Rashes,  Wounds: Please see nursing documentation          ------------------------------------------------------------------------------------------------------------------------------------------    LABs:     Latest Ref Rng & Units 12/27/2022    4:32 AM 12/26/2022    4:31 AM 12/25/2022   11:45 PM  CBC  WBC 4.0 - 10.5 K/uL 7.7  10.3  10.6   Hemoglobin 12.0 - 15.0 g/dL 9.8  47.8  29.5   Hematocrit 36.0 - 46.0 % 30.6  32.1  33.1   Platelets 150 - 400 K/uL 192  218  191       Latest Ref Rng & Units 12/26/2022    4:31 AM 12/25/2022   11:45 PM 12/25/2022   11:13 PM  CMP  Glucose 70 - 99 mg/dL 621  308  657   BUN 8 - 23 mg/dL 15  14  15    Creatinine 0.44 -  1.00 mg/dL 8.46  9.62  9.52   Sodium 135 - 145 mmol/L 139  137  140   Potassium 3.5 - 5.1 mmol/L 4.5  3.8  3.5   Chloride 98 - 111 mmol/L 104  104  102   CO2 22 - 32 mmol/L 29  25    Calcium 8.9 - 10.3 mg/dL 8.6  8.5    Total Protein 6.5 - 8.1 g/dL  6.3    Total Bilirubin <1.2 mg/dL  0.6    Alkaline Phos 38 - 126 U/L  104    AST 15 - 41 U/L  26    ALT 0 - 44 U/L  12         Micro Results  No results found for this or any previous visit (from the past 240 hour(s)).  Radiology Reports No results found.  SIGNED: Kendell Bane, MD, FHM. FAAFP. [facility] - [facility] Time spent - 55 min.  In seeing, evaluating and examining the patient. Reviewing medical records, labs, drawn plan of care. [facility],  Pager (please use [email or web address] to page/ text) Please use Epic Secure Chat for non-urgent communication (7AM-7PM)  If 7PM-7AM, please contact night-coverage [email or web address], 12/27/2022, 11:28 AM

## 2022-12-28 ENCOUNTER — Encounter (HOSPITAL_COMMUNITY): Payer: Self-pay | Admitting: Anesthesiology

## 2022-12-28 ENCOUNTER — Encounter (HOSPITAL_COMMUNITY)
Admission: EM | Disposition: A | Discharge status: Hospice | Payer: Self-pay | Source: Skilled Nursing Facility | Attending: Family Medicine

## 2022-12-28 ENCOUNTER — Inpatient Hospital Stay (HOSPITAL_COMMUNITY)
Admission: AD | Admit: 2022-12-28 | Discharge: 2022-12-29 | DRG: 812 | Disposition: A | Discharge status: Skilled Nursing Facility | Source: Skilled Nursing Facility | Attending: Internal Medicine | Admitting: Internal Medicine

## 2022-12-28 DIAGNOSIS — Y92129 Unspecified place in nursing home as the place of occurrence of the external cause: Secondary | ICD-10-CM

## 2022-12-28 DIAGNOSIS — S82232A Displaced oblique fracture of shaft of left tibia, initial encounter for closed fracture: Secondary | ICD-10-CM | POA: Diagnosis present

## 2022-12-28 DIAGNOSIS — Z8612 Personal history of poliomyelitis: Secondary | ICD-10-CM

## 2022-12-28 DIAGNOSIS — F03918 Unspecified dementia, unspecified severity, with other behavioral disturbance: Secondary | ICD-10-CM | POA: Diagnosis present

## 2022-12-28 DIAGNOSIS — E039 Hypothyroidism, unspecified: Secondary | ICD-10-CM | POA: Diagnosis present

## 2022-12-28 DIAGNOSIS — I10 Essential (primary) hypertension: Secondary | ICD-10-CM | POA: Diagnosis present

## 2022-12-28 DIAGNOSIS — D62 Acute posthemorrhagic anemia: Secondary | ICD-10-CM | POA: Diagnosis present

## 2022-12-28 DIAGNOSIS — G40909 Epilepsy, unspecified, not intractable, without status epilepticus: Secondary | ICD-10-CM | POA: Diagnosis present

## 2022-12-28 DIAGNOSIS — D509 Iron deficiency anemia, unspecified: Secondary | ICD-10-CM | POA: Diagnosis present

## 2022-12-28 DIAGNOSIS — Z66 Do not resuscitate: Secondary | ICD-10-CM | POA: Diagnosis present

## 2022-12-28 DIAGNOSIS — Z860100 Personal history of colon polyps, unspecified: Secondary | ICD-10-CM

## 2022-12-28 DIAGNOSIS — Z7189 Other specified counseling: Secondary | ICD-10-CM | POA: Diagnosis not present

## 2022-12-28 DIAGNOSIS — F419 Anxiety disorder, unspecified: Secondary | ICD-10-CM | POA: Diagnosis present

## 2022-12-28 DIAGNOSIS — Z79890 Hormone replacement therapy (postmenopausal): Secondary | ICD-10-CM

## 2022-12-28 DIAGNOSIS — R739 Hyperglycemia, unspecified: Secondary | ICD-10-CM | POA: Diagnosis present

## 2022-12-28 DIAGNOSIS — K219 Gastro-esophageal reflux disease without esophagitis: Secondary | ICD-10-CM | POA: Diagnosis present

## 2022-12-28 DIAGNOSIS — S82492A Other fracture of shaft of left fibula, initial encounter for closed fracture: Secondary | ICD-10-CM | POA: Diagnosis present

## 2022-12-28 DIAGNOSIS — E785 Hyperlipidemia, unspecified: Secondary | ICD-10-CM | POA: Diagnosis present

## 2022-12-28 DIAGNOSIS — F32A Depression, unspecified: Secondary | ICD-10-CM | POA: Diagnosis present

## 2022-12-28 DIAGNOSIS — Z515 Encounter for palliative care: Secondary | ICD-10-CM | POA: Diagnosis not present

## 2022-12-28 DIAGNOSIS — Z91011 Allergy to milk products: Secondary | ICD-10-CM

## 2022-12-28 DIAGNOSIS — Z886 Allergy status to analgesic agent status: Secondary | ICD-10-CM

## 2022-12-28 DIAGNOSIS — W19XXXA Unspecified fall, initial encounter: Secondary | ICD-10-CM | POA: Diagnosis present

## 2022-12-28 DIAGNOSIS — Z9889 Other specified postprocedural states: Secondary | ICD-10-CM

## 2022-12-28 LAB — CBC
HCT: 35.7 % — ABNORMAL LOW (ref 36.0–46.0)
Hemoglobin: 11 g/dL — ABNORMAL LOW (ref 12.0–15.0)
MCH: 29.9 pg (ref 26.0–34.0)
MCHC: 30.8 g/dL (ref 30.0–36.0)
MCV: 97 fL (ref 80.0–100.0)
Platelets: 172 10*3/uL (ref 150–400)
RBC: 3.68 MIL/uL — ABNORMAL LOW (ref 3.87–5.11)
RDW: 13 % (ref 11.5–15.5)
WBC: 7.8 10*3/uL (ref 4.0–10.5)
nRBC: 0 % (ref 0.0–0.2)

## 2022-12-28 SURGERY — INTRAMEDULLARY (IM) NAIL TIBIAL
Anesthesia: Choice | Laterality: Left

## 2022-12-28 MED ORDER — GLYCOPYRROLATE 0.2 MG/ML IJ SOLN: 0.2000 mg | INTRAMUSCULAR | Status: DC | PRN

## 2022-12-28 MED ORDER — LORAZEPAM 1 MG PO TABS: 1.0000 mg | ORAL_TABLET | ORAL | Status: DC | PRN

## 2022-12-28 MED ORDER — BIOTENE DRY MOUTH MT LIQD: 15.0000 mL | OROMUCOSAL | Status: DC | PRN

## 2022-12-28 MED ORDER — POLYVINYL ALCOHOL 1.4 % OP SOLN: 1.0000 [drp] | Freq: Four times a day (QID) | OPHTHALMIC | Status: DC | PRN

## 2022-12-28 MED ORDER — HALOPERIDOL 0.5 MG PO TABS: 0.5000 mg | ORAL_TABLET | ORAL | Status: DC | PRN

## 2022-12-28 MED ORDER — ONDANSETRON 4 MG PO TBDP: 4.0000 mg | ORAL_TABLET | Freq: Four times a day (QID) | ORAL | Status: DC | PRN

## 2022-12-28 MED ORDER — LORAZEPAM 1 MG PO TABS
1.0000 mg | ORAL_TABLET | ORAL | Status: DC | PRN
  Administered 2022-12-28 – 2022-12-29 (×2): 1 mg via SUBLINGUAL
  Filled 2022-12-28 (×2): qty 1

## 2022-12-28 MED ORDER — HALOPERIDOL LACTATE 5 MG/ML IJ SOLN
0.5000 mg | INTRAMUSCULAR | Status: DC | PRN
  Administered 2022-12-29: 0.5 mg via INTRAVENOUS
  Filled 2022-12-28: qty 1

## 2022-12-28 MED ORDER — ONDANSETRON HCL 4 MG/2ML IJ SOLN: 4.0000 mg | Freq: Four times a day (QID) | INTRAMUSCULAR | Status: DC | PRN

## 2022-12-28 MED ORDER — MORPHINE SULFATE (PF) 2 MG/ML IV SOLN: 2.0000 mg | INTRAVENOUS | Status: DC | PRN

## 2022-12-28 MED ORDER — HYDROMORPHONE HCL 1 MG/ML IJ SOLN
0.5000 mg | INTRAMUSCULAR | Status: DC | PRN
  Administered 2022-12-28: 0.5 mg via INTRAVENOUS
  Filled 2022-12-28: qty 0.5

## 2022-12-28 MED ORDER — LORAZEPAM 2 MG/ML IJ SOLN
1.0000 mg | INTRAMUSCULAR | Status: DC | PRN
  Administered 2022-12-28: 1 mg via INTRAVENOUS
  Filled 2022-12-28: qty 1

## 2022-12-28 MED ORDER — HALOPERIDOL LACTATE 2 MG/ML PO CONC: 0.5000 mg | ORAL | Status: DC | PRN

## 2022-12-28 MED ORDER — MORPHINE SULFATE (CONCENTRATE) 10 MG /0.5 ML PO SOLN
5.0000 mg | ORAL | Status: DC | PRN
Start: 2022-12-28 — End: 2022-12-29
  Administered 2022-12-28 – 2022-12-29 (×2): 5 mg via ORAL
  Filled 2022-12-28: qty 0.5

## 2022-12-28 MED ORDER — GLYCOPYRROLATE 1 MG PO TABS: 1.0000 mg | ORAL_TABLET | ORAL | Status: DC | PRN

## 2022-12-28 MED ORDER — PROCHLORPERAZINE EDISYLATE 10 MG/2ML IJ SOLN: 5.0000 mg | Freq: Four times a day (QID) | INTRAMUSCULAR | Status: DC | PRN

## 2022-12-28 MED ORDER — MORPHINE SULFATE (CONCENTRATE) 10 MG /0.5 ML PO SOLN
5.0000 mg | ORAL | Status: DC | PRN
  Administered 2022-12-28 – 2022-12-29 (×3): 5 mg via SUBLINGUAL
  Filled 2022-12-28 (×4): qty 0.5

## 2022-12-28 NOTE — Plan of Care (Signed)
°  Problem: Education: Goal: Knowledge of General Education information will improve Description: Including pain rating scale, medication(s)/side effects and non-pharmacologic comfort measures Outcome: Not Applicable   Problem: Health Behavior/Discharge Planning: Goal: Ability to manage health-related needs will improve Outcome: Not Applicable   Problem: Clinical Measurements: Goal: Ability to maintain clinical measurements within normal limits will improve Outcome: Not Applicable

## 2022-12-28 NOTE — Care Management Important Message (Signed)
Important Message  Patient Details  Name: Brittney Reynolds MRN: [id number] Date of Birth: 10/25/1933   Important Message Given:  N/A - LOS <3 / Initial given by admissions     Corey Harold 12/28/2022, 10:37 AM

## 2022-12-28 NOTE — Progress Notes (Signed)
Subjective:  Fracture left tibial shaft   89 year old female with dementia but conversive with me today.  Slight confusion.  Seems to be at baseline.  She says her  left leg is not hurting.  She does have some blisters at the fracture site.   Objective: Vital signs in last 24 hours: Temp:  [98.3 F (36.8 C)-99.1 F (37.3 C)] 98.5 F (36.9 C) (12/11 0750) Pulse Rate:  [79-88] 86 (12/11 0750) Resp:  [16-18] 16 (12/11 0750) BP: (114-157)/(51-65) 149/60 (12/11 0750) SpO2:  [97 %-99 %] 97 % (12/11 0750)  Intake/Output from previous day: 12/10 0701 - 12/11 0700 In: 100 [P.O.:100] Out: 600 [Urine:600] Intake/Output this shift: Total I/O In: 435.8 [I.V.:435.8] Out: -   Recent Labs    12/25/22 2313 12/25/22 2345 12/26/22 0431 12/27/22 0432 12/28/22 0450  HGB 12.2 11.0* 10.2* 9.8* 11.0*   Recent Labs    12/27/22 0432 12/28/22 0450  WBC 7.7 7.8  RBC 3.27* 3.68*  HCT 30.6* 35.7*  PLT 192 172   Recent Labs    12/25/22 2345 12/26/22 0431  NA 137 139  K 3.8 4.5  CL 104 104  CO2 25 29  BUN 14 15  CREATININE 0.68 0.69  GLUCOSE 110* 107*  CALCIUM 8.5* 8.6*   No results for input(s): "LABPT", "INR" in the last 72 hours.  Neurovascular intact Sensation intact distally Intact pulses distally Dorsiflexion/Plantar flexion intact No cellulitis present Compartment soft Splint/immobilizer was removed patient has 2 blisters over the fracture site no signs of open wounds no signs of compartment syndrome    Assessment/Plan: 89 year old female surgical plan was for intramedullary fixation with nail for left tibia fracture which would require removal of some of the distal hardware  However, the family has decided against surgery at this time.  As noted in previous notes patient is not a good candidate for casting.  Now that she has developed blisters it is even more detrimental to place a cast.  I do not think she will do well trying to be nonweightbearing.  She will  not be able to comply with that request.  It is highly likely that she will return to the hospital for further treatment and highly likely that this will make treating her fracture even more complicated than it is now.  For follow-up images should be obtained in the next 2 weeks and if the blisters resolved then casting can be done but again this is the less likely successful treatment     Fuller Canada 12/28/2022, 10:42 AM

## 2022-12-28 NOTE — Discharge Summary (Addendum)
Physician Discharge Summary   Patient: Brittney Reynolds MRN: [id number] DOB: 12/20/1933  Admit date:     12/25/2022  Discharge date: 12/28/22  Discharge Physician: Kendell Bane   PCP: Kari Baars, MD   Recommendations at discharge:   Follow-up with hospice  Discharge Diagnoses: Principal Problem:   Fall, initial encounter  Resolved Problems:   * No resolved hospital problems. *  Hospital Course: Brittney Reynolds is a 89 y.o. female with medical history significant for dementia, seizure disorder, chronic anxiety/depression, hypertension, hyperlipidemia, hypothyroidism, iron deficiency anemia, GERD, who presented after an unwitnessed fall at SNF.   In the ED, alert and confused.  Unable to provide a reliable history due to underlying advanced dementia.   ED Course: Temperature 98.1.  BP 181/87, pulse 83, respiratory 17, saturation 98% on room air.  Lab studies notable for WBC 10.6, hemoglobin 11.0, platelet count 191, neutrophil count 8.5.  Serum glucose 110, albumin 3.4.  UA pending.  Imaging revealed mildly comminuted oblique proximal tibial shaft fracture, and suspected nondisplaced fibular neck fracture. Healed left proximal fibular shaft fracture.   EDP discussed the case with orthopedic surgery who will see in consultation.   Admitted by [facility], hospitalist service.    Left tip/fib fracture Left mildly comminuted oblique proximal tibial shaft fracture, and suspected nondisplaced fibular neck fracture. -Unwitnessed fall, POA -Continue pain management, -Hemodynamically stable - Orthopedic surgery consulted-appreciate Dr. Romeo Apple follow-up  -Family decided no surgical intervention, pursuing with hospice   -Discussed with POA on 12/28/2022  Brittney Reynolds he confirmed DNR/DNI status, pursuing with comfort care   Dementia with behavioral disturbances -Mentally stable, pleasantly confused, at baseline -Continue meds Reorient as needed Fall precautions    Seizure disorder Resume home AEDs Seizure precautions   Acute blood loss anemia in the setting of fracture Continue to monitor H&H  -Stable   Hypothyroidism Resume home levothyroxine   GERD Resume home PPI   Iron deficiency anemia Resume home iron supplement   Hyperlipidemia Resume home Lipitor.   Hyperglycemia, mild Resolved      Consultants: Palliative care team/PT Procedures performed: None   Disposition: Hospice care Diet recommendation:  Discharge Diet Orders (From admission, onward)     Start     Ordered   12/28/22 0000  Diet - low sodium heart healthy        12/28/22 1008           Regular diet DISCHARGE MEDICATION: Allergies as of 12/28/2022       Reactions   Acetaminophen    Patient was instructed to only take Ibuprofen by MD   Milk-related Compounds Other (See Comments)   Unknown        Medication List     STOP taking these medications    atorvastatin 10 MG tablet Commonly known as: LIPITOR   Dilantin 125 MG/5ML suspension Generic drug: phenytoin   memantine 10 MG tablet Commonly known as: NAMENDA       TAKE these medications    ascorbic Acid 500 MG Cpcr Commonly known as: VITAMIN C Take 500 mg by mouth daily.   Ensure Take 237 mLs by mouth 3 (three) times daily between meals.   ferrous sulfate 325 (65 FE) MG tablet Take 325 mg by mouth daily.   HYDROcodone-acetaminophen 5-325 MG tablet Commonly known as: NORCO/VICODIN Take 0.5 tablets by mouth every 6 (six) hours as needed for moderate pain (pain score 4-6).   levothyroxine 125 MCG tablet Commonly known as: SYNTHROID Take 125 mcg  by mouth daily before breakfast.   LORazepam 2 MG/ML concentrated solution Commonly known as: ATIVAN Take 1 mg by mouth every 6 (six) hours as needed for anxiety or seizure.   metoprolol succinate 25 MG 24 hr tablet Commonly known as: TOPROL-XL Take 25 mg by mouth every evening.   mirtazapine 7.5 MG tablet Commonly known as:  REMERON Take 7.5 mg by mouth at bedtime.   multivitamin with minerals tablet Take 1 tablet by mouth daily.   primidone 50 MG tablet Commonly known as: MYSOLINE Take 200 mg by mouth 3 (three) times daily.   raloxifene 60 MG tablet Commonly known as: EVISTA Take 60 mg by mouth daily.   senna 8.6 MG tablet Commonly known as: SENOKOT Take 1 tablet by mouth daily.        Discharge Exam: Filed Weights   12/25/22 1848 12/26/22 0147 12/26/22 0157  Weight: 68 kg 49.8 kg 48.9 kg      General:  Pleasantly confused AAO x 1,  cooperative, no distress;   HEENT:  Normocephalic, PERRL, otherwise with in Normal limits   Neuro:  Dementia poor interaction, limited exam  CNII-XII intact. , normal motor and sensation, reflexes intact   Lungs:   Clear to auscultation BL, Respirations unlabored,  No wheezes / crackles  Cardio:    S1/S2, RRR, No murmure, No Rubs or Gallops   Abdomen:  Soft, non-tender, bowel sounds active all four quadrants, no guarding or peritoneal signs.  Muscular  skeletal:  None ambulating Limited exam -global generalized weaknesses - in bed, able to move all 4 extremities,   2+ pulses,  symmetric, No pitting edema  Skin:  Dry, warm to touch, negative for any Rashes,  Wounds: Please see nursing documentation       Condition at discharge: poor  The results of significant diagnostics from this hospitalization (including imaging, microbiology, ancillary and laboratory) are listed below for reference.   Imaging Studies: DG Tibia/Fibula Left Port  Result Date: 12/26/2022 CLINICAL DATA:  Closed fracture of the tibia.  Remote fixation EXAM: PORTABLE LEFT TIBIA AND FIBULA - 2 VIEW COMPARISON:  Knee film 12/25/2022 FINDINGS: acute fracture of the proximal tibial metaphysis just superior to remote healed fracture. Fracture of the proximal fibula also superior to a healed fracture. Internal dynamic plate fixation of the distal tibia. No distal fracture dislocation noted.  IMPRESSION: 1. Proximal tibial and fibular fracture. Fractures well demonstrated on comparison knee radiograph 1 day prior. 2. No acute fracture of the distal tibia fibula. Electronically Signed   By: Genevive Bi M.D.   On: 12/26/2022 13:55   DG Ankle Complete Left  Result Date: 12/25/2022 CLINICAL DATA:  Fall EXAM: LEFT ANKLE COMPLETE - 3+ VIEW COMPARISON:  None Available. FINDINGS: Lateral compression plate and screw fixation of the distal tibia. No evidence of hardware complication. Old/healed distal fibular shaft fracture. No fracture or dislocation is seen. The ankle mortise is intact. The visualized soft tissues are unremarkable. IMPRESSION: No fracture or dislocation is seen. Lateral compression plate and screw fixation of the distal tibia. No evidence of hardware complication. Electronically Signed   By: Charline Bills M.D.   On: 12/25/2022 19:51   DG Foot Complete Left  Result Date: 12/25/2022 CLINICAL DATA:  Fall EXAM: LEFT FOOT - COMPLETE 3+ VIEW COMPARISON:  None Available. FINDINGS: Status post ORIF of the distal tibia, incompletely visualized. No evidence of hardware complication. No fracture or dislocation is seen. The joint spaces are preserved.  Hallux valgus deformity. The visualized  soft tissues are unremarkable. IMPRESSION: No fracture or dislocation is seen. Status post ORIF of the distal tibia, incompletely visualized. No evidence of hardware complication. Electronically Signed   By: Charline Bills M.D.   On: 12/25/2022 19:51   DG Knee Complete 4 Views Left  Result Date: 12/25/2022 CLINICAL DATA:  Fall EXAM: LEFT KNEE - COMPLETE 4+ VIEW COMPARISON:  None Available. FINDINGS: Mildly comminuted oblique proximal tibial shaft fracture. No intra-articular extension. Healed left proximal fibular shaft fracture. However, a superimposed nondisplaced fracture involving the fibular neck is suspected. Mild anterior soft tissue swelling. IMPRESSION: Mildly comminuted oblique proximal  tibial shaft fracture, as above. Suspected nondisplaced fibular neck fracture. Healed left proximal fibular shaft fracture. Electronically Signed   By: Charline Bills M.D.   On: 12/25/2022 19:50   DG Hip Unilat W or Wo Pelvis 2-3 Views Left  Result Date: 12/25/2022 CLINICAL DATA:  Fall EXAM: DG HIP (WITH OR WITHOUT PELVIS) 2-3V LEFT COMPARISON:  None Available. FINDINGS: Status post ORIF of the left hip. No evidence of hardware complication. No fracture or dislocation is seen. Mild degenerative changes of the right hip. Visualized bony pelvis appears intact. IMPRESSION: Status post ORIF of the left hip. No evidence of hardware complication. No fracture or dislocation is seen. Electronically Signed   By: Charline Bills M.D.   On: 12/25/2022 19:49    Microbiology: Results for orders placed or performed during the hospital encounter of 12/25/22  Urine Culture (for pregnant, neutropenic or urologic patients or patients with an indwelling urinary catheter)     Status: Abnormal (Preliminary result)   Collection Time: 12/26/22  4:20 PM   Specimen: Urine, Clean Catch  Result Value Ref Range Status   Specimen Description   Final    URINE, CLEAN CATCH Performed at [facility], [address] [place], [place] [address]    Special Requests   Final    NONE Performed at [facility], [address], [place], [place] [address]    Culture (A)  Final    >=100,000 COLONIES/mL GRAM NEGATIVE RODS IDENTIFICATION AND SUSCEPTIBILITIES TO FOLLOW Performed at [facility], [address] [place], [place] [address]    Report Status PENDING  Incomplete    Labs: CBC: Recent Labs  Lab 12/25/22 2313 12/25/22 2345 12/26/22 0431 12/27/22 0432 12/28/22 0450  WBC  --  10.6* 10.3 7.7 7.8  NEUTROABS  --  8.5*  --   --   --   HGB 12.2 11.0* 10.2* 9.8* 11.0*  HCT 36.0 33.1* 32.1* 30.6* 35.7*  MCV  --  91.7 94.4 93.6 97.0  PLT  --  191 218 192 172   Basic Metabolic Panel: Recent Labs  Lab  12/25/22 2313 12/25/22 2345 12/26/22 0431  NA 140 137 139  K 3.5 3.8 4.5  CL 102 104 104  CO2  --  25 29  GLUCOSE 105* 110* 107*  BUN 15 14 15   CREATININE 0.70 0.68 0.69  CALCIUM  --  8.5* 8.6*  MG  --   --  2.1  PHOS  --   --  4.2   Liver Function Tests: Recent Labs  Lab 12/25/22 2345  AST 26  ALT 12  ALKPHOS 104  BILITOT 0.6  PROT 6.3*  ALBUMIN 3.4*   CBG: No results for input(s): "GLUCAP" in the last 168 hours.  Discharge time spent: greater than 45 minutes.  Signed: Kendell Bane, MD [facility] 12/28/2022

## 2022-12-28 NOTE — TOC Progression Note (Signed)
Transition of Care [facility]) - Progression Note    Patient Details  Name: Brittney Reynolds MRN: [id number] Date of Birth: 06/07/1933  [facility] CM/SW Contact  Villa Herb, [place] Phone Number: 12/28/2022, 9:27 AM  Clinical Narrative:    CSW updated by palliative NP that pts family would like full comfort with residential hospice referral to [facility] for placement at [facility]. CSW made referral to Maryruth Eve with hospice states she will updated when a hospice RN can come out to assess pt. TOC to follow.   Expected Discharge Plan: Long Term Nursing Home Barriers to Discharge: Continued Medical Work up  Expected Discharge Plan and Services In-house Referral: Clinical Social Work Discharge Planning Services: CM Consult Post Acute Care Choice: Nursing Home Living arrangements for the past 2 months: Skilled Nursing Facility                                       Social Determinants of Health (SDOH) Interventions SDOH Screenings   Food Insecurity: Patient Unable To Answer (12/26/2022)  Housing: Patient Unable To Answer (12/26/2022)  Transportation Needs: Patient Unable To Answer (12/26/2022)  Utilities: Patient Unable To Answer (12/26/2022)  Tobacco Use: Unknown (12/26/2022)    Readmission Risk Interventions    12/26/2022   11:32 AM  Readmission Risk Prevention Plan  Transportation Screening Complete  Home Care Screening Complete  Medication Review (RN CM) Complete

## 2022-12-28 NOTE — Progress Notes (Signed)
Palliative:   Chart review completed.   Ms. Brittney Reynolds, [place], is lying quietly in bed.  She appears acutely/chronically ill and frail.  She is resting comfortably, but wakes easily.  She has known dementia but is able to tell me her name.  She is unable to tell me where we are.  I reassured her that she is safe, in the hospital.  I share that she has broken her leg again.  When asked how she feels, she states "terrible".  IV pain medication has been added, discussed with nursing staff.  I offer Kardi something to eat, but she declines.  She is able to drink water without overt signs and symptoms of aspiration.  I am not sure that she can make her basic needs known.  There is no family at bedside at this time.  Phone call to brother/HCPOA, Brittney Reynolds.  He tells me that he has had a long discussion with his niece Brittney Reynolds who lives locally and sees [place] often.  He shares a story of decline over the last year in particular.  He shares that Brittney Reynolds has described a big weight loss stating that she "looks like a skeleton".   Richard continues to tell [employer].  He states that they grew up on a farm in [name] the [place].  Delecia had polio and epilepsy as a child.  The polio affected her left hand which she is unable to use now.  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  He tells me that [place] lived on the farm helping with chores and housework until their mother died.  Richard shares that at that point he took [place] into his home.  Richard's wife had a brain aneurysm which limited their ability to care for [facility].  He shares that during her time they are, [place] fell and broke her left lower extremity.  When Richard felt that they could not care for her anymore she came back to [place] to long-term care where she has been for 4+ years.  He shares that due to their health that they have not been able to visit her for over a  year now.  I shared that it is clear his love and care for his Sister Pashence.    We talk about comfort and dignity at end-of-life.  Brittney Reynolds is readily open to residential hospice placement.  Provider choice offered.  He is agreeable to transition to full comfort care, residential hospice at [facility].  Conference with attending, bedside nursing staff, transition of care team related to patient condition, needs, goals of care, disposition.  Plan: Let nature take its course, comfort and dignity at residential hospice, [facility].  I shared that if [facility] cannot accept her at this time she could return to [facility] with full comfort care under hospice care.  He is agreeable. Prognosis: Anticipate 2 weeks or less based on acuity of condition, marked weight loss, frailty-wheelchair-bound transferring only prior to this fracture, advancing dementia, long-term care resident 4+ years.    55 minutes  Lillia Carmel, NP Palliative medicine team Team phone [phone]

## 2022-12-28 NOTE — Plan of Care (Signed)
  Problem: Activity: Goal: Risk for activity intolerance will decrease Outcome: Not Progressing   Problem: Nutrition: Goal: Adequate nutrition will be maintained Outcome: Not Progressing   Problem: Pain Management: Goal: General experience of comfort will improve Outcome: Not Met (add Reason)

## 2022-12-28 NOTE — TOC Progression Note (Signed)
Transition of Care [facility]) - Progression Note    Patient Details  Name: Brittney Reynolds MRN: [id number] Date of Birth: [birth year]  [facility] Care [facility] CM/SW Contact  Villa Herb, [place] Phone Number: 12/28/2022, 2:06 PM  Clinical Narrative:    CSW updated that hospice has assessed pt at this time. Pt has been approved for residential hospice however there are no beds available at this time. Pt will be admitted to hospice in a virtual bed as GIP. Treatment team aware. TOC to follow.   Expected Discharge Plan: Long Term Nursing Home Barriers to Discharge: Continued Medical Work up  Expected Discharge Plan and Services In-house Referral: Clinical Social Work Discharge Planning Services: CM Consult Post Acute Care Choice: Nursing Home Living arrangements for the past 2 months: Skilled Nursing Facility Expected Discharge Date: 12/28/22                                     Social Determinants of Health (SDOH) Interventions SDOH Screenings   Food Insecurity: Patient Unable To Answer (12/26/2022)  Housing: Patient Unable To Answer (12/26/2022)  Transportation Needs: Patient Unable To Answer (12/26/2022)  Utilities: Patient Unable To Answer (12/26/2022)  Tobacco Use: Unknown (12/26/2022)    Readmission Risk Interventions    12/26/2022   11:32 AM  Readmission Risk Prevention Plan  Transportation Screening Complete  Home Care Screening Complete  Medication Review (RN CM) Complete

## 2022-12-28 NOTE — Progress Notes (Signed)
   12/28/22 1518  Spiritual Encounters  Type of Visit Initial  Care provided to: Patient  Referral source Chaplain team  Reason for visit Routine spiritual support  OnCall Visit No   Chaplain's Reason for Visit: Chaplain visited the Pt at the suggestion of he lead Chaplain, to provide support and presence. Chaplain time spent:  20 minutes Chaplain Interventions: Cultivated a relationship of care and support via reflective listening and compassionate presence. Asked open ended questions to facilitate life review and story telling Chaplain Assessment: Pt Needs Spiritual: No spiritual assessment conducted as Pt level of clarity and alertness in question Emotional: No spiritual assessment conducted Relational:  Intermediate hopes:  Ultimate hopes:  Pt Resources  Resources Identified: No spiritual assessment conducted as Pt level of clarity and alertness in question  (1- Could benefit from support; 2- Supported; 3-Strongly Supported) Spiritual:  Emotional: Relational: Additional Assessment: Pt was open to talking with me and easily began to share stories with me about her mother's cooking and her own personally history of epileptic seizures, but as conversation when on, it was clear that Pt memories were coming from a time further back in time, as the Pt herself is 89 and insisted her mother was still alive and living with her at 89 years old. Pt seems to have worked hard to overcome the stigma and impediments of epilepsy, but still holds some wounds from those who doubted her or held her back. Pt did seem to enjoy sharing stories about her mother- might be a good place for others to begin conversation with her.  Patient Outcomes:  Chaplaincy Plan: Chaplain will continue to follow Pt while she is present in [facility]. Chaplain Raelene Bott, MDiv [email or web address]

## 2022-12-29 DIAGNOSIS — Z515 Encounter for palliative care: Secondary | ICD-10-CM | POA: Diagnosis not present

## 2022-12-29 DIAGNOSIS — Z7189 Other specified counseling: Secondary | ICD-10-CM | POA: Diagnosis not present

## 2022-12-29 LAB — URINE CULTURE: Culture: >=100000 — AB

## 2022-12-29 MED ORDER — LORAZEPAM 2 MG/ML IJ SOLN
1.0000 mg | INTRAMUSCULAR | Status: DC | PRN
Start: 2022-12-29 — End: 2022-12-29
  Administered 2022-12-29: 1 mg via INTRAVENOUS
  Filled 2022-12-29: qty 1

## 2022-12-29 MED ORDER — GLYCOPYRROLATE 1 MG PO TABS: 1.0000 mg | ORAL_TABLET | ORAL | 0 refills | Status: AC | PRN

## 2022-12-29 MED ORDER — LORAZEPAM 1 MG PO TABS: 1.0000 mg | ORAL_TABLET | ORAL | 0 refills | Status: AC | PRN

## 2022-12-29 MED ORDER — LORAZEPAM 1 MG PO TABS: 1.0000 mg | ORAL_TABLET | ORAL | Status: DC | PRN

## 2022-12-29 MED ORDER — MORPHINE SULFATE (CONCENTRATE) 10 MG /0.5 ML PO SOLN: 5.0000 mg | ORAL | 0 refills | Status: AC | PRN

## 2022-12-29 NOTE — H&P (Signed)
History and Physical    Brittney Reynolds WGN:[id number] DOB: [birth year] DOA: 12/28/2022  PCP: Kari Baars, MD   Patient coming from: SNF  HPI:  Brittney Reynolds is a 89 y.o. female with medical history significant for dementia, seizure disorder, chronic anxiety/depression, hypertension, hyperlipidemia, hypothyroidism, iron deficiency anemia, GERD, who presented after an unwitnessed fall at SNF.   In the ED, alert and confused.  Unable to provide a reliable history due to underlying advanced dementia.   ED Course: Temperature 98.1.  BP 181/87, pulse 83, respiratory 17, saturation 98% on room air.  Lab studies notable for WBC 10.6, hemoglobin 11.0, platelet count 191, neutrophil count 8.5.  Serum glucose 110, albumin 3.4.  UA pending.   Imaging revealed mildly comminuted oblique proximal tibial shaft fracture, and suspected nondisplaced fibular neck fracture. Healed left proximal fibular shaft fracture.   EDP discussed the case with orthopedic surgery who will see in consultation.   Admitted by [facility], hospitalist service.  Review of Systems: Reviewed as noted above, otherwise negative.  Past Medical History:  Diagnosis Date   Anemia    Anxiety    Colon polyp 5 yrs ago   in  [name]   Depression    HTN (hypertension)    Polio    As a child   Seizures [facility])     Past Surgical History:  Procedure Laterality Date   FOOT SURGERY  11/11   Left foot   OTIF  2009   Right ankle     reports that she has never smoked. She does not have any smokeless tobacco history on file. She reports that she does not drink alcohol and does not use drugs.  Allergies  Allergen Reactions   Acetaminophen     Patient was instructed to only take Ibuprofen by MD   Milk-Related Compounds Other (See Comments)    Unknown     No family history on file.  Prior to Admission medications   Medication Sig Start Date End Date Taking? Authorizing Provider  ascorbic Acid (VITAMIN C) 500 MG  CPCR Take 500 mg by mouth daily.   Yes [provider]  Ensure (ENSURE) Take 237 mLs by mouth 3 (three) times daily between meals.   Yes [provider]  ferrous sulfate 325 (65 FE) MG tablet Take 325 mg by mouth daily.   Yes [provider]  HYDROcodone-acetaminophen (NORCO/VICODIN) 5-325 MG tablet Take 0.5 tablets by mouth every 6 (six) hours as needed for moderate pain (pain score 4-6). 11/23/22  Yes [provider]  levothyroxine (SYNTHROID) 125 MCG tablet Take 125 mcg by mouth daily before breakfast.   Yes [provider]  LORazepam (ATIVAN) 2 MG/ML concentrated solution Take 1 mg by mouth every 6 (six) hours as needed for anxiety or seizure.   Yes [provider]  metoprolol succinate (TOPROL-XL) 25 MG 24 hr tablet Take 25 mg by mouth every evening.    Yes [provider]  mirtazapine (REMERON) 7.5 MG tablet Take 7.5 mg by mouth at bedtime. 12/09/22  Yes [provider]  Multiple Vitamins-Minerals (MULTIVITAMIN WITH MINERALS) tablet Take 1 tablet by mouth daily.   Yes [provider]  primidone (MYSOLINE) 50 MG tablet Take 200 mg by mouth 3 (three) times daily.   Yes [provider]  raloxifene (EVISTA) 60 MG tablet Take 60 mg by mouth daily.   Yes [provider]  senna (SENOKOT) 8.6 MG tablet Take 1 tablet by mouth daily.   Yes  [provider]    Physical Exam: There were no vitals filed for this visit.  Constitutional: NAD, calm, comfortable There were no vitals filed for this visit. Eyes: lids and conjunctivae normal Neck: normal, supple Respiratory: clear to auscultation bilaterally. Normal respiratory effort. No accessory muscle use.  Cardiovascular: Regular rate and rhythm, no murmurs. Abdomen: no tenderness, no distention. Bowel sounds positive.  Musculoskeletal:  No edema. Skin: no rashes, lesions, ulcers.  Psychiatric: Flat affect  Labs on Admission: I have personally  reviewed following labs and imaging studies  CBC: Recent Labs  Lab 12/25/22 2313 12/25/22 2345 12/26/22 0431 12/27/22 0432 12/28/22 0450  WBC  --  10.6* 10.3 7.7 7.8  NEUTROABS  --  8.5*  --   --   --   HGB 12.2 11.0* 10.2* 9.8* 11.0*  HCT 36.0 33.1* 32.1* 30.6* 35.7*  MCV  --  91.7 94.4 93.6 97.0  PLT  --  191 218 192 172   Basic Metabolic Panel: Recent Labs  Lab 12/25/22 2313 12/25/22 2345 12/26/22 0431  NA 140 137 139  K 3.5 3.8 4.5  CL 102 104 104  CO2  --  25 29  GLUCOSE 105* 110* 107*  BUN 15 14 15   CREATININE 0.70 0.68 0.69  CALCIUM  --  8.5* 8.6*  MG  --   --  2.1  PHOS  --   --  4.2   GFR: Estimated Creatinine Clearance: 38.2 mL/min (by C-G formula based on SCr of 0.69 mg/dL). Liver Function Tests: Recent Labs  Lab 12/25/22 2345  AST 26  ALT 12  ALKPHOS 104  BILITOT 0.6  PROT 6.3*  ALBUMIN 3.4*   No results for input(s): "LIPASE", "AMYLASE" in the last 168 hours. No results for input(s): "AMMONIA" in the last 168 hours. Coagulation Profile: No results for input(s): "INR", "PROTIME" in the last 168 hours. Cardiac Enzymes: No results for input(s): "CKTOTAL", "CKMB", "CKMBINDEX", "TROPONINI" in the last 168 hours. BNP (last 3 results) No results for input(s): "PROBNP" in the last 8760 hours. HbA1C: No results for input(s): "HGBA1C" in the last 72 hours. CBG: No results for input(s): "GLUCAP" in the last 168 hours. Lipid Profile: No results for input(s): "CHOL", "HDL", "LDLCALC", "TRIG", "CHOLHDL", "LDLDIRECT" in the last 72 hours. Thyroid Function Tests: No results for input(s): "TSH", "T4TOTAL", "FREET4", "T3FREE", "THYROIDAB" in the last 72 hours. Anemia Panel: No results for input(s): "VITAMINB12", "FOLATE", "FERRITIN", "TIBC", "IRON", "RETICCTPCT" in the last 72 hours. Urine analysis:    Component Value Date/Time   COLORURINE YELLOW 12/26/2022 1620   APPEARANCEUR CLEAR 12/26/2022 1620   LABSPEC 1.015 12/26/2022 1620   PHURINE 6.0  12/26/2022 1620   GLUCOSEU NEGATIVE 12/26/2022 1620   HGBUR NEGATIVE 12/26/2022 1620   BILIRUBINUR NEGATIVE 12/26/2022 1620   KETONESUR NEGATIVE 12/26/2022 1620   PROTEINUR NEGATIVE 12/26/2022 1620   UROBILINOGEN 0.2 11/12/2014 1910   NITRITE NEGATIVE 12/26/2022 1620   LEUKOCYTESUR TRACE (A) 12/26/2022 1620    Radiological Exams on Admission: No results found.  EKG: Independently reviewed.   Assessment/Plan  Left tip/fib fracture Left mildly comminuted oblique proximal tibial shaft fracture, and suspected nondisplaced fibular neck fracture. -Unwitnessed fall, POA -Continue pain management, -Hemodynamically stable - Orthopedic surgery consulted-appreciate Dr. Romeo Apple follow-up   -Family decided no surgical intervention, pursuing with hospice   -Discussed with POA on 12/28/2022  Mr. Wyvonne Lenz he confirmed DNR/DNI status, pursuing with comfort care     Dementia with behavioral disturbances -Mentally stable, pleasantly confused, at baseline -Continue  meds Reorient as needed Fall precautions   Seizure disorder Resume home AEDs Seizure precautions   Acute blood loss anemia in the setting of fracture Continue to monitor H&H  -Stable   Hypothyroidism Resume home levothyroxine   GERD Resume home PPI   Iron deficiency anemia Resume home iron supplement   Hyperlipidemia Resume home Lipitor.   Hyperglycemia, mild Resolved  Tedrick Port D Criss Pallone DO [facility]  If 7PM-7AM, please contact night-coverage [email or web address]  12/29/2022, 1:20 PM

## 2022-12-29 NOTE — TOC Progression Note (Signed)
Transition of Care [facility]) - Progression Note    Patient Details  Name: Brittney Reynolds MRN: [id number] Date of Birth: 08-26-1933  Transition of Care [facility]) CM/SW Contact  Elliot Gault, LCSW Phone Number: 12/29/2022, 9:56 AM  Clinical Narrative:      TOC following. [facility] RN coming to see pt this AM to evaluate if she is stable to transfer to [facility] today as they have a bed available.     Updated MD and RN. Will follow up once Hospice RN eval complete.  Expected Discharge Plan and Services                                               Social Determinants of Health (SDOH) Interventions SDOH Screenings   Food Insecurity: Patient Unable To Answer (12/28/2022)  Housing: Patient Unable To Answer (12/28/2022)  Transportation Needs: Patient Unable To Answer (12/28/2022)  Utilities: Patient Unable To Answer (12/28/2022)  Tobacco Use: Unknown (12/26/2022)    Readmission Risk Interventions    12/26/2022   11:32 AM  Readmission Risk Prevention Plan  Transportation Screening Complete  Home Care Screening Complete  Medication Review (RN CM) Complete

## 2022-12-29 NOTE — Progress Notes (Signed)
Palliative: Ms. Brittney Reynolds, [place], is resting quietly in bed.  She appears acutely/chronically ill and very frail.  She has known dementia but is able to tell me her name only.  She does endorse pain.  I do not believe that she can make her basic needs known.  There is no family at bedside at this time but [place] hospice RN is at bedside.  [name] has been accepted to residential hospice care.  There may be a bed for her at [facility] this afternoon.  Face-to-face conference with bedside nursing staff and transition of care team related to patient condition, needs, goals of care, disposition.  Plan: Focus on comfort and dignity at end-of-life, residential hospice at [facility].  End-of-life order set in place.  Symptom management needs discussed with bedside nursing staff.  DNR/goldenrod form on chart. Prognosis: Anticipate 5 to 10 days.  50 minutes Lillia Carmel, NP Palliative medicine team Team phone [phone]

## 2022-12-29 NOTE — Plan of Care (Signed)
  Problem: Nutrition: Goal: Adequate nutrition will be maintained Outcome: Not Progressing   Problem: Coping: Goal: Level of anxiety will decrease Outcome: Not Progressing   

## 2022-12-29 NOTE — TOC Transition Note (Signed)
Transition of Care [facility]) - Discharge Note   Patient Details  Name: Brittney Reynolds MRN: [id number] Date of Birth: [birth year]  Transition of Care [facility]) CM/SW Contact:  Elliot Gault, LCSW Phone Number: 12/29/2022, 1:45 PM   Clinical Narrative:     TOC following. [facility] determined pt appropriate to transfer to [facility] today for residential hospice but when this was explained to pt's brother/POA by hospice staff, he refused the transfer due to the daily rate for [facility] being something he did not want to be financially responsible for paying. He requested pt return to [facility] with hospice services there.  This LCSW spoke with pt's brother/POA to confirm above. Brother does confirm that he wants pt to return to [place] with hospice.  Updated Debbie at [facility] who states that they can admit pt back today. Brother in agreement.  Updated MD and RN. DC clinical will be sent electronically. RN to call report. EMS will be arranged for transport.  No other TOC needs for dc.  Final next level of care: Long Term Nursing Home Barriers to Discharge: Barriers Resolved   Patient Goals and CMS Choice Patient states their goals for this hospitalization and ongoing recovery are:: NH with hospice CMS Medicare.gov Compare Post Acute Care list provided to:: Patient Represenative (must comment) Choice offered to / list presented to : Sibling, [facility] POA / Guardian      Discharge Placement                       Discharge Plan and Services Additional resources added to the After Visit Summary for                                       Social Drivers of Health (SDOH) Interventions SDOH Screenings   Food Insecurity: Patient Unable To Answer (12/28/2022)  Housing: Patient Unable To Answer (12/28/2022)  Transportation Needs: Patient Unable To Answer (12/28/2022)  Utilities: Patient Unable To Answer (12/28/2022)  Tobacco Use: Unknown  (12/26/2022)     Readmission Risk Interventions    12/26/2022   11:32 AM  Readmission Risk Prevention Plan  Transportation Screening Complete  Home Care Screening Complete  Medication Review (RN CM) Complete

## 2022-12-29 NOTE — Plan of Care (Signed)
  Problem: Pain Management: Goal: General experience of comfort will improve Outcome: Progressing

## 2022-12-29 NOTE — Discharge Summary (Signed)
Physician Discharge Summary  Brittney Reynolds ZOX:[id number] DOB: Jan 02, 1934 DOA: 12/28/2022  PCP: Kari Baars, MD  Admit date: 12/28/2022  Discharge date: 12/29/2022  Admitted From:SNF  Disposition:  SNF with hospice  Recommendations for Outpatient Follow-up:  Follow up with hospice  Home Health:None  Equipment/Devices:None  Discharge Condition:Stable  CODE STATUS: DNR-comfort care  Diet recommendation: Regular  Brief/Interim Summary: Brittney Reynolds is a 89 y.o. female with medical history significant for dementia, seizure disorder, chronic anxiety/depression, hypertension, hyperlipidemia, hypothyroidism, iron deficiency anemia, GERD, who presented after an unwitnessed fall at [facility].   In the ED, alert and confused.  Unable to provide a reliable history due to underlying advanced dementia.   ED Course: Temperature 98.1.  BP 181/87, pulse 83, respiratory 17, saturation 98% on room air.  Lab studies notable for WBC 10.6, hemoglobin 11.0, platelet count 191, neutrophil count 8.5.  Serum glucose 110, albumin 3.4.  UA pending.   Imaging revealed mildly comminuted oblique proximal tibial shaft fracture, and suspected nondisplaced fibular neck fracture. Healed left proximal fibular shaft fracture.   EDP discussed the case with orthopedic surgery who will see in consultation.   Admitted by [facility], hospitalist service.  Discharge Diagnoses:   Left tip/fib fracture Left mildly comminuted oblique proximal tibial shaft fracture, and suspected nondisplaced fibular neck fracture. -Unwitnessed fall, POA -Continue pain management, -Hemodynamically stable - Orthopedic surgery consulted-appreciate Dr. Romeo Apple follow-up   -Family decided no surgical intervention, pursuing with hospice   -Discussed with POA on 12/28/2022  Mr. Wyvonne Lenz he confirmed DNR/DNI status, pursuing with comfort care     Dementia with behavioral disturbances -Mentally stable, pleasantly confused, at  baseline -Continue meds Reorient as needed Fall precautions   Seizure disorder Resume home AEDs Seizure precautions   Acute blood loss anemia in the setting of fracture Continue to monitor H&H  -Stable   Hypothyroidism Resume home levothyroxine   GERD Resume home PPI   Iron deficiency anemia Resume home iron supplement   Hyperlipidemia Resume home Lipitor.   Hyperglycemia, mild Resolved   Discharge Instructions  Discharge Instructions     Diet - low sodium heart healthy   Complete by: As directed    Increase activity slowly   Complete by: As directed       Allergies as of 12/29/2022       Reactions   Acetaminophen    Patient was instructed to only take Ibuprofen by MD   Milk-related Compounds Other (See Comments)   Unknown        Medication List     STOP taking these medications    ascorbic Acid 500 MG Cpcr Commonly known as: VITAMIN C   Ensure   ferrous sulfate 325 (65 FE) MG tablet   HYDROcodone-acetaminophen 5-325 MG tablet Commonly known as: NORCO/VICODIN   levothyroxine 125 MCG tablet Commonly known as: SYNTHROID   LORazepam 2 MG/ML concentrated solution Commonly known as: ATIVAN Replaced by: LORazepam 1 MG tablet   metoprolol succinate 25 MG 24 hr tablet Commonly known as: TOPROL-XL   mirtazapine 7.5 MG tablet Commonly known as: REMERON   multivitamin with minerals tablet   primidone 50 MG tablet Commonly known as: MYSOLINE   raloxifene 60 MG tablet Commonly known as: EVISTA   senna 8.6 MG tablet Commonly known as: SENOKOT       TAKE these medications    glycopyrrolate 1 MG tablet Commonly known as: ROBINUL Take 1 tablet (1 mg total) by mouth every 4 (four) hours as needed (excessive secretions).  LORazepam 1 MG tablet Commonly known as: ATIVAN Take 1 tablet (1 mg total) by mouth every 4 (four) hours as needed for anxiety. Replaces: LORazepam 2 MG/ML concentrated solution   morphine CONCENTRATE 10 mg / 0.5  ml concentrated solution Take 0.25 mLs (5 mg total) by mouth every 2 (two) hours as needed for moderate pain (pain score 4-6) (or dyspnea).        Allergies  Allergen Reactions   Acetaminophen     Patient was instructed to only take Ibuprofen by MD   Milk-Related Compounds Other (See Comments)    Unknown     Consultations: Palliative   Procedures/Studies: DG Tibia/Fibula Left Port Result Date: 12/26/2022 CLINICAL DATA:  Closed fracture of the tibia.  Remote fixation EXAM: PORTABLE LEFT TIBIA AND FIBULA - 2 VIEW COMPARISON:  Knee film 12/25/2022 FINDINGS: acute fracture of the proximal tibial metaphysis just superior to remote healed fracture. Fracture of the proximal fibula also superior to a healed fracture. Internal dynamic plate fixation of the distal tibia. No distal fracture dislocation noted. IMPRESSION: 1. Proximal tibial and fibular fracture. Fractures well demonstrated on comparison knee radiograph 1 day prior. 2. No acute fracture of the distal tibia fibula. Electronically Signed   By: Genevive Bi M.D.   On: 12/26/2022 13:55   DG Ankle Complete Left Result Date: 12/25/2022 CLINICAL DATA:  Fall EXAM: LEFT ANKLE COMPLETE - 3+ VIEW COMPARISON:  None Available. FINDINGS: Lateral compression plate and screw fixation of the distal tibia. No evidence of hardware complication. Old/healed distal fibular shaft fracture. No fracture or dislocation is seen. The ankle mortise is intact. The visualized soft tissues are unremarkable. IMPRESSION: No fracture or dislocation is seen. Lateral compression plate and screw fixation of the distal tibia. No evidence of hardware complication. Electronically Signed   By: Charline Bills M.D.   On: 12/25/2022 19:51   DG Foot Complete Left Result Date: 12/25/2022 CLINICAL DATA:  Fall EXAM: LEFT FOOT - COMPLETE 3+ VIEW COMPARISON:  None Available. FINDINGS: Status post ORIF of the distal tibia, incompletely visualized. No evidence of hardware  complication. No fracture or dislocation is seen. The joint spaces are preserved.  Hallux valgus deformity. The visualized soft tissues are unremarkable. IMPRESSION: No fracture or dislocation is seen. Status post ORIF of the distal tibia, incompletely visualized. No evidence of hardware complication. Electronically Signed   By: Charline Bills M.D.   On: 12/25/2022 19:51   DG Knee Complete 4 Views Left Result Date: 12/25/2022 CLINICAL DATA:  Fall EXAM: LEFT KNEE - COMPLETE 4+ VIEW COMPARISON:  None Available. FINDINGS: Mildly comminuted oblique proximal tibial shaft fracture. No intra-articular extension. Healed left proximal fibular shaft fracture. However, a superimposed nondisplaced fracture involving the fibular neck is suspected. Mild anterior soft tissue swelling. IMPRESSION: Mildly comminuted oblique proximal tibial shaft fracture, as above. Suspected nondisplaced fibular neck fracture. Healed left proximal fibular shaft fracture. Electronically Signed   By: Charline Bills M.D.   On: 12/25/2022 19:50   DG Hip Unilat W or Wo Pelvis 2-3 Views Left Result Date: 12/25/2022 CLINICAL DATA:  Fall EXAM: DG HIP (WITH OR WITHOUT PELVIS) 2-3V LEFT COMPARISON:  None Available. FINDINGS: Status post ORIF of the left hip. No evidence of hardware complication. No fracture or dislocation is seen. Mild degenerative changes of the right hip. Visualized bony pelvis appears intact. IMPRESSION: Status post ORIF of the left hip. No evidence of hardware complication. No fracture or dislocation is seen. Electronically Signed   By: Roselie Awkward.D.  On: 12/25/2022 19:49     Discharge Exam: There were no vitals filed for this visit. There were no vitals filed for this visit.  General: Pt is alert, awake, not in acute distress Cardiovascular: RRR, S1/S2 +, no rubs, no gallops Respiratory: CTA bilaterally, no wheezing, no rhonchi Abdominal: Soft, NT, ND, bowel sounds + Extremities: no edema, no  cyanosis    The results of significant diagnostics from this hospitalization (including imaging, microbiology, ancillary and laboratory) are listed below for reference.     Microbiology: Recent Results (from the past 240 hours)  Urine Culture (for pregnant, neutropenic or urologic patients or patients with an indwelling urinary catheter)     Status: Abnormal (Preliminary result)   Collection Time: 12/26/22  4:20 PM   Specimen: Urine, Clean Catch  Result Value Ref Range Status   Specimen Description   Final    URINE, CLEAN CATCH Performed at [facility], [address], [place], [place] [address]    Special Requests   Final    NONE Performed at [facility], [address] [place], [place] [address]    Culture (A)  Final    >=100,000 COLONIES/mL ESCHERICHIA COLI 4,000 COLONIES/mL PROTEUS MIRABILIS SUSCEPTIBILITIES TO FOLLOW Performed at [facility], [address], [place], [place] [address]    Report Status PENDING  Incomplete   Organism ID, Bacteria ESCHERICHIA COLI (A)  Final      Susceptibility   Escherichia coli - MIC*    AMPICILLIN <=2 SENSITIVE Sensitive     CEFAZOLIN <=4 SENSITIVE Sensitive     CEFEPIME <=0.12 SENSITIVE Sensitive     CEFTRIAXONE <=0.25 SENSITIVE Sensitive     CIPROFLOXACIN <=0.25 SENSITIVE Sensitive     GENTAMICIN <=1 SENSITIVE Sensitive     IMIPENEM <=0.25 SENSITIVE Sensitive     NITROFURANTOIN <=16 SENSITIVE Sensitive     TRIMETH/SULFA <=20 SENSITIVE Sensitive     AMPICILLIN/SULBACTAM <=2 SENSITIVE Sensitive     PIP/TAZO <=4 SENSITIVE Sensitive ug/mL    * >=100,000 COLONIES/mL ESCHERICHIA COLI     Labs: BNP (last 3 results) No results for input(s): "BNP" in the last 8760 hours. Basic Metabolic Panel: Recent Labs  Lab 12/25/22 2313 12/25/22 2345 12/26/22 0431  NA 140 137 139  K 3.5 3.8 4.5  CL 102 104 104  CO2  --  25 29  GLUCOSE 105* 110* 107*  BUN 15 14 15   CREATININE 0.70 0.68 0.69  CALCIUM  --  8.5* 8.6*  MG  --   --   2.1  PHOS  --   --  4.2   Liver Function Tests: Recent Labs  Lab 12/25/22 2345  AST 26  ALT 12  ALKPHOS 104  BILITOT 0.6  PROT 6.3*  ALBUMIN 3.4*   No results for input(s): "LIPASE", "AMYLASE" in the last 168 hours. No results for input(s): "AMMONIA" in the last 168 hours. CBC: Recent Labs  Lab 12/25/22 2313 12/25/22 2345 12/26/22 0431 12/27/22 0432 12/28/22 0450  WBC  --  10.6* 10.3 7.7 7.8  NEUTROABS  --  8.5*  --   --   --   HGB 12.2 11.0* 10.2* 9.8* 11.0*  HCT 36.0 33.1* 32.1* 30.6* 35.7*  MCV  --  91.7 94.4 93.6 97.0  PLT  --  191 218 192 172   Cardiac Enzymes: No results for input(s): "CKTOTAL", "CKMB", "CKMBINDEX", "TROPONINI" in the last 168 hours. BNP: Invalid input(s): "POCBNP" CBG: No results for input(s): "GLUCAP" in the last 168 hours. D-Dimer No  results for input(s): "DDIMER" in the last 72 hours. Hgb A1c No results for input(s): "HGBA1C" in the last 72 hours. Lipid Profile No results for input(s): "CHOL", "HDL", "LDLCALC", "TRIG", "CHOLHDL", "LDLDIRECT" in the last 72 hours. Thyroid function studies No results for input(s): "TSH", "T4TOTAL", "T3FREE", "THYROIDAB" in the last 72 hours.  Invalid input(s): "FREET3" Anemia work up No results for input(s): "VITAMINB12", "FOLATE", "FERRITIN", "TIBC", "IRON", "RETICCTPCT" in the last 72 hours. Urinalysis    Component Value Date/Time   COLORURINE YELLOW 12/26/2022 1620   APPEARANCEUR CLEAR 12/26/2022 1620   LABSPEC 1.015 12/26/2022 1620   PHURINE 6.0 12/26/2022 1620   GLUCOSEU NEGATIVE 12/26/2022 1620   HGBUR NEGATIVE 12/26/2022 1620   BILIRUBINUR NEGATIVE 12/26/2022 1620   KETONESUR NEGATIVE 12/26/2022 1620   PROTEINUR NEGATIVE 12/26/2022 1620   UROBILINOGEN 0.2 11/12/2014 1910   NITRITE NEGATIVE 12/26/2022 1620   LEUKOCYTESUR TRACE (A) 12/26/2022 1620   Sepsis Labs Recent Labs  Lab 12/25/22 2345 12/26/22 0431 12/27/22 0432 12/28/22 0450  WBC 10.6* 10.3 7.7 7.8   Microbiology Recent  Results (from the past 240 hours)  Urine Culture (for pregnant, neutropenic or urologic patients or patients with an indwelling urinary catheter)     Status: Abnormal (Preliminary result)   Collection Time: 12/26/22  4:20 PM   Specimen: Urine, Clean Catch  Result Value Ref Range Status   Specimen Description   Final    URINE, CLEAN CATCH Performed at [facility], [address], [place], [place] [address]    Special Requests   Final    NONE Performed at [facility], [address], [place], [place] [address]    Culture (A)  Final    >=100,000 COLONIES/mL ESCHERICHIA COLI 4,000 COLONIES/mL PROTEUS MIRABILIS SUSCEPTIBILITIES TO FOLLOW Performed at [facility], [address] [place], [place] [address]    Report Status PENDING  Incomplete   Organism ID, Bacteria ESCHERICHIA COLI (A)  Final      Susceptibility   Escherichia coli - MIC*    AMPICILLIN <=2 SENSITIVE Sensitive     CEFAZOLIN <=4 SENSITIVE Sensitive     CEFEPIME <=0.12 SENSITIVE Sensitive     CEFTRIAXONE <=0.25 SENSITIVE Sensitive     CIPROFLOXACIN <=0.25 SENSITIVE Sensitive     GENTAMICIN <=1 SENSITIVE Sensitive     IMIPENEM <=0.25 SENSITIVE Sensitive     NITROFURANTOIN <=16 SENSITIVE Sensitive     TRIMETH/SULFA <=20 SENSITIVE Sensitive     AMPICILLIN/SULBACTAM <=2 SENSITIVE Sensitive     PIP/TAZO <=4 SENSITIVE Sensitive ug/mL    * >=100,000 COLONIES/mL ESCHERICHIA COLI     Time coordinating discharge: 35 minutes  SIGNED:   Erick Blinks, DO [facility] 12/29/2022, 1:47 PM  If 7PM-7AM, please contact night-coverage [email or web address]

## 2022-12-29 NOTE — [place] FL2 (Signed)
[place] MEDICAID FL2 LEVEL OF CARE FORM     IDENTIFICATION  Patient Name: Brittney Reynolds Birthdate: 1934-01-16 Sex: female Admission Date (Current Location): 12/28/2022  [facility] and [place] Number:  [employer] and Address:  [facility],  [address] [place] [address]      Provider Number: [id number]  Attending Physician Name and Address:  Erick Blinks, DO  Relative Name and Phone Number:       Current Level of Care: Hospital Recommended Level of Care: Skilled Nursing Facility Prior Approval Number:    Date Approved/Denied:   PASRR Number:    Discharge Plan: SNF    Current Diagnoses: Patient Active Problem List   Diagnosis Date Noted   Fall, initial encounter 12/25/2022   Encephalomalacia on imaging study 11/14/2014   Encephalopathy, metabolic 11/14/2014   Lower urinary tract infectious disease 11/12/2014   Dysarthria 11/12/2014   Hypertension 11/12/2014   Seizure disorder (HCC) 11/12/2014   Weakness generalized 10/26/2012   Anemia 10/26/2012   Diarrhea 10/26/2012   FRACTURE, BIMALLEOLAR 07/09/2007    Orientation RESPIRATION BLADDER Height & Weight     Self  Normal Incontinent Weight:   Height:     BEHAVIORAL SYMPTOMS/MOOD NEUROLOGICAL BOWEL NUTRITION STATUS      Incontinent Diet (see dc summary)  AMBULATORY STATUS COMMUNICATION OF NEEDS Skin   Total Care Verbally Normal                       Personal Care Assistance Level of Assistance    Bathing Assistance: Maximum assistance Feeding assistance: Maximum assistance Dressing Assistance: Maximum assistance     Functional Limitations Info    Sight Info: Adequate Hearing Info: Adequate Speech Info: Adequate    SPECIAL CARE FACTORS FREQUENCY                       Contractures Contractures Info: Not present    Additional Factors Info  Code Status, Allergies Code Status Info: DNR Allergies Info: Acetaminophen and Milk related compounds            Current Medications (12/29/2022):  This is the current hospital active medication list Current Facility-Administered Medications  Medication Dose Route Frequency Provider Last Rate Last Admin   antiseptic oral rinse (BIOTENE) solution 15 mL  15 mL Topical PRN Dove, Tasha A, NP       glycopyrrolate (ROBINUL) tablet 1 mg  1 mg Oral Q4H PRN Dove, Tasha A, NP       Or   glycopyrrolate (ROBINUL) injection 0.2 mg  0.2 mg Subcutaneous Q4H PRN Dove, Tasha A, NP       Or   glycopyrrolate (ROBINUL) injection 0.2 mg  0.2 mg Intravenous Q4H PRN Dove, Tasha A, NP       haloperidol (HALDOL) tablet 0.5 mg  0.5 mg Oral Q4H PRN Dove, Tasha A, NP       Or   haloperidol (HALDOL) 2 MG/ML solution 0.5 mg  0.5 mg Sublingual Q4H PRN Dove, Tasha A, NP       Or   haloperidol lactate (HALDOL) injection 0.5 mg  0.5 mg Intravenous Q4H PRN Dove, Tasha A, NP   0.5 mg at 12/29/22 0001   LORazepam (ATIVAN) tablet 1 mg  1 mg Oral Q4H PRN Dove, Tasha A, NP       Or   LORazepam (ATIVAN) injection 1 mg  1 mg Intravenous Q2H PRN Katheran Awe, NP  1 mg at 12/29/22 1157   morphine CONCENTRATE 10 mg / 0.5 ml oral solution 5 mg  5 mg Oral Q2H PRN Lillia Carmel A, NP   5 mg at 12/29/22 0000   Or   morphine CONCENTRATE 10 mg / 0.5 ml oral solution 5 mg  5 mg Sublingual Q2H PRN Lillia Carmel A, NP   5 mg at 12/29/22 1157   ondansetron (ZOFRAN-ODT) disintegrating tablet 4 mg  4 mg Oral Q6H PRN Lillia Carmel A, NP       Or   ondansetron (ZOFRAN) injection 4 mg  4 mg Intravenous Q6H PRN Dove, Tasha A, NP       polyvinyl alcohol (LIQUIFILM TEARS) 1.4 % ophthalmic solution 1 drop  1 drop Both Eyes QID PRN Katheran Awe, NP         Discharge Medications: Please see discharge summary for a list of discharge medications.  Relevant Imaging Results:  Relevant Lab Results:   Additional Information [facility] with Leim Fabry, LCSW
